# Patient Record
Sex: Female | Born: 1945 | Race: White | Hispanic: No | Marital: Married | Smoking: Current some day smoker
Health system: Southern US, Community
[De-identification: ages and names within clinical notes are randomized; demographics above are authoritative.]

## PROBLEM LIST (undated history)

## (undated) DIAGNOSIS — Z8701 Personal history of pneumonia (recurrent): Secondary | ICD-10-CM

## (undated) DIAGNOSIS — I509 Heart failure, unspecified: Secondary | ICD-10-CM

## (undated) DIAGNOSIS — I493 Ventricular premature depolarization: Secondary | ICD-10-CM

## (undated) DIAGNOSIS — G43909 Migraine, unspecified, not intractable, without status migrainosus: Secondary | ICD-10-CM

## (undated) DIAGNOSIS — R911 Solitary pulmonary nodule: Secondary | ICD-10-CM

## (undated) DIAGNOSIS — Z0389 Encounter for observation for other suspected diseases and conditions ruled out: Secondary | ICD-10-CM

## (undated) DIAGNOSIS — J449 Chronic obstructive pulmonary disease, unspecified: Secondary | ICD-10-CM

## (undated) DIAGNOSIS — I471 Supraventricular tachycardia, unspecified: Secondary | ICD-10-CM

## (undated) DIAGNOSIS — Z9889 Other specified postprocedural states: Secondary | ICD-10-CM

## (undated) DIAGNOSIS — Z720 Tobacco use: Secondary | ICD-10-CM

## (undated) DIAGNOSIS — I340 Nonrheumatic mitral (valve) insufficiency: Secondary | ICD-10-CM

## (undated) DIAGNOSIS — R42 Dizziness and giddiness: Secondary | ICD-10-CM

## (undated) DIAGNOSIS — IMO0001 Reserved for inherently not codable concepts without codable children: Secondary | ICD-10-CM

## (undated) DIAGNOSIS — Z8709 Personal history of other diseases of the respiratory system: Secondary | ICD-10-CM

## (undated) HISTORY — DX: Migraine, unspecified, not intractable, without status migrainosus: G43.909

## (undated) HISTORY — PX: CHOLECYSTECTOMY: SHX55

## (undated) HISTORY — PX: TONSILLECTOMY: SUR1361

## (undated) HISTORY — PX: EYE SURGERY: SHX253

---

## 2004-11-28 ENCOUNTER — Ambulatory Visit: Payer: Self-pay

## 2005-06-09 ENCOUNTER — Ambulatory Visit: Payer: Self-pay

## 2006-06-16 ENCOUNTER — Ambulatory Visit: Payer: Self-pay

## 2006-09-11 ENCOUNTER — Other Ambulatory Visit: Payer: Self-pay

## 2006-09-11 ENCOUNTER — Ambulatory Visit: Payer: Self-pay | Admitting: Family Medicine

## 2006-09-11 ENCOUNTER — Emergency Department: Payer: Self-pay | Admitting: Emergency Medicine

## 2006-09-12 ENCOUNTER — Emergency Department: Payer: Self-pay | Admitting: Emergency Medicine

## 2006-09-16 ENCOUNTER — Ambulatory Visit: Payer: Self-pay | Admitting: Specialist

## 2007-06-22 ENCOUNTER — Ambulatory Visit: Payer: Self-pay

## 2007-11-02 ENCOUNTER — Ambulatory Visit: Payer: Self-pay | Admitting: Endocrinology

## 2009-02-13 ENCOUNTER — Ambulatory Visit: Payer: Self-pay

## 2010-05-17 ENCOUNTER — Ambulatory Visit: Payer: Self-pay

## 2010-12-17 ENCOUNTER — Ambulatory Visit: Payer: Self-pay | Admitting: Psychiatry

## 2011-06-16 ENCOUNTER — Emergency Department: Payer: Self-pay | Admitting: Emergency Medicine

## 2011-09-17 ENCOUNTER — Ambulatory Visit: Payer: Self-pay

## 2011-09-18 ENCOUNTER — Ambulatory Visit: Payer: Self-pay

## 2013-07-15 ENCOUNTER — Emergency Department: Payer: Self-pay | Admitting: Emergency Medicine

## 2013-07-15 LAB — URINALYSIS, COMPLETE
Bacteria: NONE SEEN
Bilirubin,UR: NEGATIVE
Ketone: NEGATIVE
Leukocyte Esterase: NEGATIVE
Nitrite: NEGATIVE
Ph: 8 (ref 4.5–8.0)
RBC,UR: 1 /HPF (ref 0–5)
WBC UR: 1 /HPF (ref 0–5)

## 2013-07-15 LAB — TROPONIN I: Troponin-I: <0.02 ng/mL

## 2013-07-15 LAB — HEPATIC FUNCTION PANEL A (ARMC)
Albumin: 3.5 g/dL (ref 3.4–5.0)
Alkaline Phosphatase: 89 U/L
Bilirubin,Total: 0.3 mg/dL (ref 0.2–1.0)
SGPT (ALT): 14 U/L (ref 12–78)
Total Protein: 7.1 g/dL (ref 6.4–8.2)

## 2013-07-15 LAB — BASIC METABOLIC PANEL
Anion Gap: 6 — ABNORMAL LOW (ref 7–16)
Chloride: 105 mmol/L (ref 98–107)
EGFR (Non-African Amer.): >60
Glucose: 95 mg/dL (ref 65–99)
Osmolality: 270 (ref 275–301)
Potassium: 3.5 mmol/L (ref 3.5–5.1)
Sodium: 136 mmol/L (ref 136–145)

## 2013-07-15 LAB — CBC WITH DIFFERENTIAL/PLATELET
Basophil #: 0.2 10*3/uL — ABNORMAL HIGH (ref 0.0–0.1)
Basophil %: 1 %
Eosinophil #: 0.2 10*3/uL (ref 0.0–0.7)
Eosinophil %: 1.4 %
HCT: 41.9 % (ref 35.0–47.0)
HGB: 14.1 g/dL (ref 12.0–16.0)
Lymphocyte %: 23.6 %
MCH: 29.3 pg (ref 26.0–34.0)
MCHC: 33.6 g/dL (ref 32.0–36.0)
MCV: 87 fL (ref 80–100)
Neutrophil #: 10.4 10*3/uL — ABNORMAL HIGH (ref 1.4–6.5)
RBC: 4.8 10*6/uL (ref 3.80–5.20)
RDW: 13.2 % (ref 11.5–14.5)
WBC: 15.6 10*3/uL — ABNORMAL HIGH (ref 3.6–11.0)

## 2013-12-22 ENCOUNTER — Ambulatory Visit: Payer: Self-pay | Admitting: Internal Medicine

## 2014-01-12 ENCOUNTER — Ambulatory Visit: Payer: Self-pay | Admitting: Surgery

## 2014-01-12 LAB — HEPATIC FUNCTION PANEL A (ARMC)
ALBUMIN: 3.4 g/dL (ref 3.4–5.0)
AST: 15 U/L (ref 15–37)
Alkaline Phosphatase: 88 U/L
BILIRUBIN TOTAL: 0.2 mg/dL (ref 0.2–1.0)
Bilirubin, Direct: <0.1 mg/dL (ref 0.00–0.20)
SGPT (ALT): 12 U/L (ref 12–78)
Total Protein: 6.4 g/dL (ref 6.4–8.2)

## 2014-01-13 ENCOUNTER — Ambulatory Visit: Payer: Self-pay | Admitting: Surgery

## 2014-01-17 LAB — PATHOLOGY REPORT

## 2014-06-30 ENCOUNTER — Emergency Department: Payer: Self-pay | Admitting: Emergency Medicine

## 2014-06-30 DIAGNOSIS — I4891 Unspecified atrial fibrillation: Secondary | ICD-10-CM

## 2014-06-30 LAB — CBC
HCT: 42.8 % (ref 35.0–47.0)
HGB: 14.4 g/dL (ref 12.0–16.0)
MCH: 30.7 pg (ref 26.0–34.0)
MCHC: 33.6 g/dL (ref 32.0–36.0)
MCV: 91 fL (ref 80–100)
Platelet: 267 10*3/uL (ref 150–440)
RBC: 4.68 10*6/uL (ref 3.80–5.20)
RDW: 13 % (ref 11.5–14.5)
WBC: 14.5 10*3/uL — ABNORMAL HIGH (ref 3.6–11.0)

## 2014-06-30 LAB — BASIC METABOLIC PANEL
ANION GAP: 4 — AB (ref 7–16)
BUN: 8 mg/dL (ref 7–18)
CALCIUM: 8.8 mg/dL (ref 8.5–10.1)
CHLORIDE: 106 mmol/L (ref 98–107)
CO2: 29 mmol/L (ref 21–32)
CREATININE: 0.9 mg/dL (ref 0.60–1.30)
EGFR (African American): >60
GLUCOSE: 110 mg/dL — AB (ref 65–99)
Osmolality: 277 (ref 275–301)
Potassium: 3.8 mmol/L (ref 3.5–5.1)
Sodium: 139 mmol/L (ref 136–145)

## 2014-06-30 LAB — TROPONIN I

## 2014-06-30 LAB — T4, FREE: Free Thyroxine: 0.89 ng/dL (ref 0.76–1.46)

## 2014-06-30 LAB — CK: CK, TOTAL: 74 U/L (ref 26–192)

## 2014-07-13 ENCOUNTER — Encounter: Payer: Self-pay | Admitting: *Deleted

## 2014-07-13 ENCOUNTER — Encounter: Payer: Self-pay | Admitting: Cardiovascular Disease

## 2014-07-13 ENCOUNTER — Telehealth: Payer: Self-pay

## 2014-07-13 ENCOUNTER — Telehealth: Payer: Self-pay | Admitting: *Deleted

## 2014-07-13 MED ORDER — METOPROLOL TARTRATE 25 MG PO TABS: 25.0000 mg | ORAL_TABLET | Freq: Two times a day (BID) | ORAL | Status: DC

## 2014-07-13 NOTE — Telephone Encounter (Signed)
Informed patient per Dr. Fletcher Anon event monitor showed: 1- Normal sinus rhythm  2- No afib 3- very frequent pvc's and short runs of nsvt   Start metoprolol 25 mg bid   Patient verbalized understanding

## 2014-07-13 NOTE — Telephone Encounter (Signed)
Pt called back, states someone had called her regarding a medication Dr. Fletcher Anon wanted to put her on. Please call.

## 2014-07-13 NOTE — Telephone Encounter (Signed)
RX sent to [facility]

## 2014-07-13 NOTE — Telephone Encounter (Signed)
LVM 07/07/14

## 2014-07-14 ENCOUNTER — Encounter: Payer: Self-pay | Admitting: Cardiovascular Disease

## 2014-07-14 ENCOUNTER — Ambulatory Visit (INDEPENDENT_AMBULATORY_CARE_PROVIDER_SITE_OTHER): Payer: Medicare PPO | Admitting: Cardiovascular Disease

## 2014-07-14 VITALS — BP 86/60 | HR 70 | Ht 59.5 in | Wt 116.2 lb

## 2014-07-14 DIAGNOSIS — I493 Ventricular premature depolarization: Secondary | ICD-10-CM

## 2014-07-14 DIAGNOSIS — R000 Tachycardia, unspecified: Secondary | ICD-10-CM

## 2014-07-14 DIAGNOSIS — R0602 Shortness of breath: Secondary | ICD-10-CM

## 2014-07-14 DIAGNOSIS — R079 Chest pain, unspecified: Secondary | ICD-10-CM

## 2014-07-14 NOTE — Patient Instructions (Addendum)
[place]  Your caregiver has ordered a Stress Test with nuclear imaging. The purpose of this test is to evaluate the blood supply to your heart muscle. This procedure is referred to as a "Non-Invasive Stress Test." This is because other than having an IV started in your vein, nothing is inserted or "invades" your body. Cardiac stress tests are done to find areas of poor blood flow to the heart by determining the extent of coronary artery disease (CAD). Some patients exercise on a treadmill, which naturally increases the blood flow to your heart, while others who are  unable to walk on a treadmill due to physical limitations have a pharmacologic/chemical stress agent called Lexiscan . This medicine will mimic walking on a treadmill by temporarily increasing your coronary blood flow.   Please note: these test may take anywhere between 2-4 hours to complete  PLEASE REPORT TO [facility] AT THE FIRST DESK WILL DIRECT YOU WHERE TO GO  Date of Procedure:________12/15/15_____________________________  Arrival Time for Procedure:_______0745 am_______________________  Instructions regarding medication:    _x___:  Hold betablocker(s) night before procedure and morning of procedure: Metoprolol     PLEASE NOTIFY THE OFFICE AT LEAST 24 HOURS IN ADVANCE IF YOU ARE UNABLE TO KEEP YOUR APPOINTMENT.  [phone] AND  PLEASE NOTIFY NUCLEAR MEDICINE AT [facility] AT LEAST 24 HOURS IN ADVANCE IF YOU ARE UNABLE TO KEEP YOUR APPOINTMENT. [phone]  How to prepare for your Myoview test:  1. Do not eat or drink after midnight 2. No caffeine for 24 hours prior to test 3. No smoking 24 hours prior to test. 4. Your medication may be taken with water.  If your doctor stopped a medication because of this test, do not take that medication. 5. Ladies, please do not wear dresses.  Skirts or pants are appropriate. Please wear a short sleeve shirt. 6. No perfume, cologne or  lotion. 7. Wear comfortable walking shoes. No heels!  Your physician has requested that you have an echocardiogram. Echocardiography is a painless test that uses sound waves to create images of your heart. It provides your doctor with information about the size and shape of your heart and how well your heart's chambers and valves are working. This procedure takes approximately one hour. There are no restrictions for this procedure.  Your physician recommends that you schedule a follow-up appointment in:  With Dr. Fletcher Anon after your tests

## 2014-07-16 ENCOUNTER — Encounter: Payer: Self-pay | Admitting: Cardiovascular Disease

## 2014-07-16 DIAGNOSIS — R0602 Shortness of breath: Secondary | ICD-10-CM | POA: Insufficient documentation

## 2014-07-16 DIAGNOSIS — I493 Ventricular premature depolarization: Secondary | ICD-10-CM | POA: Insufficient documentation

## 2014-07-16 NOTE — Assessment & Plan Note (Signed)
The patient has been having significant exertional dyspnea with minimal activities which has been worsening. She has very frequent PVCs. The etiology of PVCs is still not established. Given her current symptoms, we definitely have to rule out an ischemic etiology or structural heart disease. Thus, I requested a treadmill nuclear stress test and an echocardiogram. Given frequent PVCs, a treadmill stress test alone is not sufficient.

## 2014-07-16 NOTE — Progress Notes (Signed)
Primary care physician: Dr. Ginette Pitman  HPI  This is a 68 year old self-referred patient for evaluation of palpitations. She reports previous history of palpitations which was evaluated by Dr. Ubaldo Glassing about 2 years ago. She was told about mild leaking in one of the heart valves but no other significant abnormalities. She has no history of diabetes, hypertension or hyperlipidemia. She does have prolonged history of tobacco use and continues to smoke almost one pack per day. She has family history of atrial fibrillation but no coronary artery disease. Over the few months, she has experienced significant symptoms of exertional dyspnea without chest discomfort. This has been associated with very frequent palpitations and irregular heartbeats. No orthopnea or PND. Thyroid function was normal. She went to the emergency room on November 27. EKG showed PVCs but no other significant abnormalities. Labs were unremarkable. A 48-hour Holter monitor was arranged which showed normal sinus rhythm with no evidence of atrial fibrillation. She was noted to have very frequent PVCs and short runs of nonsustained ventricular tachycardia. She had a total of 26,000 PVCs in 48 hours.  I started her on metoprolol.  No Known Allergies   Current Outpatient Prescriptions on File Prior to Visit  Medication Sig Dispense Refill  . metoprolol tartrate (LOPRESSOR) 25 MG tablet Take 1 tablet (25 mg total) by mouth 2 (two) times daily. 180 tablet 3   No current facility-administered medications on file prior to visit.     Past Medical History  Diagnosis Date  . Migraine      Past Surgical History  Procedure Laterality Date  . Cesarean section    . Cholecystectomy    . Tonsillectomy       Family History  Problem Relation Age of Onset  . Stroke Father      History   Social History  . Marital Status: Married    Spouse Name: N/A    Number of Children: N/A  . Years of Education: N/A   Occupational History  . Not on  file.   Social History Main Topics  . Smoking status: Current Every Day Smoker -- 45 years    Types: Cigarettes  . Smokeless tobacco: Not on file  . Alcohol Use: Yes     Comment: occasional  . Drug Use: No  . Sexual Activity: Not on file   Other Topics Concern  . Not on file   Social History Narrative     ROS A 10 point review of system was performed. It is negative other than that mentioned in the history of present illness.   PHYSICAL EXAM   BP 86/60 mmHg  Pulse 70  Ht 4' 11.5" (1.511 m)  Wt 116 lb 4 oz (52.731 kg)  BMI 23.10 kg/m2 Constitutional: She is oriented to person, place, and time. She appears well-developed and well-nourished. No distress.  HENT: No nasal discharge.  Head: Normocephalic and atraumatic.  Eyes: Pupils are equal and round. No discharge.  Neck: Normal range of motion. Neck supple. No JVD present. No thyromegaly present.  Cardiovascular: Normal rate, regular rhythm, normal heart sounds. Exam reveals no gallop and no friction rub. No murmur heard.  Pulmonary/Chest: Effort normal and breath sounds normal. No stridor. No respiratory distress. She has no wheezes. She has no rales. She exhibits no tenderness.  Abdominal: Soft. Bowel sounds are normal. She exhibits no distension. There is no tenderness. There is no rebound and no guarding.  Musculoskeletal: Normal range of motion. She exhibits no edema and no tenderness.  Neurological: She  is alert and oriented to person, place, and time. Coordination normal.  Skin: Skin is warm and dry. No rash noted. She is not diaphoretic. No erythema. No pallor.  Psychiatric: She has a normal mood and affect. Her behavior is normal. Judgment and thought content normal.     NHA:FBXUX  Rhythm  - occasional ectopic ventricular beat    WITHIN NORMAL LIMITS   ASSESSMENT AND PLAN

## 2014-07-16 NOTE — Assessment & Plan Note (Signed)
She has excessive amount of PVCs and was started on small dose metoprolol. Blood pressure is low and thus the dose cannot be increased. Further management will be decided based on the results of cardiac testing.

## 2014-07-18 ENCOUNTER — Encounter: Payer: Self-pay | Admitting: Cardiovascular Disease

## 2014-07-18 ENCOUNTER — Ambulatory Visit: Payer: Self-pay | Admitting: Cardiovascular Disease

## 2014-07-18 DIAGNOSIS — R0602 Shortness of breath: Secondary | ICD-10-CM

## 2014-07-20 ENCOUNTER — Other Ambulatory Visit (INDEPENDENT_AMBULATORY_CARE_PROVIDER_SITE_OTHER): Payer: Commercial Managed Care - HMO

## 2014-07-20 ENCOUNTER — Other Ambulatory Visit: Payer: Self-pay

## 2014-07-20 DIAGNOSIS — R079 Chest pain, unspecified: Secondary | ICD-10-CM

## 2014-07-20 DIAGNOSIS — R000 Tachycardia, unspecified: Secondary | ICD-10-CM

## 2014-07-20 DIAGNOSIS — R0602 Shortness of breath: Secondary | ICD-10-CM

## 2014-07-21 ENCOUNTER — Encounter (INDEPENDENT_AMBULATORY_CARE_PROVIDER_SITE_OTHER): Payer: Commercial Managed Care - HMO

## 2014-08-02 ENCOUNTER — Encounter: Payer: Self-pay | Admitting: Cardiovascular Disease

## 2014-08-02 ENCOUNTER — Encounter: Payer: Self-pay | Admitting: *Deleted

## 2014-08-02 ENCOUNTER — Telehealth: Payer: Self-pay | Admitting: *Deleted

## 2014-08-02 ENCOUNTER — Ambulatory Visit (INDEPENDENT_AMBULATORY_CARE_PROVIDER_SITE_OTHER): Payer: Commercial Managed Care - HMO | Admitting: Cardiovascular Disease

## 2014-08-02 VITALS — BP 94/60 | HR 67 | Ht 59.5 in | Wt 116.2 lb

## 2014-08-02 DIAGNOSIS — Z01812 Encounter for preprocedural laboratory examination: Secondary | ICD-10-CM

## 2014-08-02 DIAGNOSIS — Z720 Tobacco use: Secondary | ICD-10-CM

## 2014-08-02 DIAGNOSIS — I340 Nonrheumatic mitral (valve) insufficiency: Secondary | ICD-10-CM

## 2014-08-02 DIAGNOSIS — I493 Ventricular premature depolarization: Secondary | ICD-10-CM

## 2014-08-02 NOTE — Assessment & Plan Note (Signed)
Symptoms improved with small dose metoprolol. Nuclear stress test showed no evidence of ischemia and ejection fraction is normal. I might consider a follow-up with her monitor in the near future to ensure appropriate control of the burden of PVCs.

## 2014-08-02 NOTE — Assessment & Plan Note (Signed)
I discussed with her the importance of smoking cessation. 

## 2014-08-02 NOTE — Patient Instructions (Signed)
Your physician has requested that you have a TEE. During a TEE, sound waves are used to create images of your heart. It provides your doctor with information about the size and shape of your heart and how well your heart's chambers and valves are working. In this test, a transducer is attached to the end of a flexible tube that's guided down your throat and into your esophagus (the tube leading from you mouth to your stomach) to get a more detailed image of your heart. You are not awake for the procedure. Please see the instruction sheet given to you today. For further information please visit [email or web address].   Your physician recommends that you schedule a follow-up appointment in:  1 month

## 2014-08-02 NOTE — Telephone Encounter (Signed)
Informed patient her TEE is scheduled for 08/07/14 at 8 am  She is to arrive at 7 am  Patient verbalized understanding

## 2014-08-02 NOTE — Assessment & Plan Note (Addendum)
The patient was found to have prolapse of the posterior mitral leaflet with moderate to severe mitral regurgitation. I recommend a transesophageal echocardiogram for further evaluation as she continues to complain of significant dyspnea.Marland Kitchen

## 2014-08-02 NOTE — Progress Notes (Signed)
Primary care physician: Dr. Ginette Pitman  HPI  This is a 68 year old female who is here today for a follow-up visit regarding PVCs. She reports previous history of palpitations which was evaluated by Dr. Ubaldo Glassing about 2 years ago. She was told about mild leaking in one of the heart valves but no other significant abnormalities. She has no history of diabetes, hypertension or hyperlipidemia. She does have prolonged history of tobacco use and continues to smoke almost one pack per day. She has family history of atrial fibrillation but no coronary artery disease. She was seen recently for significant symptoms of exertional dyspnea without chest discomfort. This was associated with very frequent palpitations and irregular heartbeats. No orthopnea or PND. Thyroid function was normal. A 48-hour Holter monitor was arranged which showed normal sinus rhythm with no evidence of atrial fibrillation. She was noted to have very frequent PVCs and short runs of nonsustained ventricular tachycardia. She had a total of 26,000 PVCs in 48 hours.  I started her on metoprolol with subsequent improvement in symptoms. She underwent a pharmacologic nuclear stress test which showed no evidence of ischemia with normal ejection fraction.  Echocardiogram showed an ejection fraction of 60-65%, moderate posterior mitral valve prolapse with moderate to severe mitral regurgitation.  No Known Allergies   Current Outpatient Prescriptions on File Prior to Visit  Medication Sig Dispense Refill  . aspirin 81 MG tablet Take 81 mg by mouth daily.    . Calcium Carbonate-Vitamin D (CALCIUM-VITAMIN D) 500-200 MG-UNIT per tablet Take 1 tablet by mouth daily.    . cholecalciferol (VITAMIN D) 400 UNITS TABS tablet Take 400 Units by mouth daily.    . Inulin (FIBER CHOICE PO) Take by mouth daily.    . metoprolol tartrate (LOPRESSOR) 25 MG tablet Take 1 tablet (25 mg total) by mouth 2 (two) times daily. 180 tablet 3   No current facility-administered  medications on file prior to visit.     Past Medical History  Diagnosis Date  . Migraine      Past Surgical History  Procedure Laterality Date  . Cesarean section    . Cholecystectomy    . Tonsillectomy       Family History  Problem Relation Age of Onset  . Stroke Father      History   Social History  . Marital Status: Married    Spouse Name: N/A    Number of Children: N/A  . Years of Education: N/A   Occupational History  . Not on file.   Social History Main Topics  . Smoking status: Current Every Day Smoker -- 45 years    Types: Cigarettes  . Smokeless tobacco: Not on file  . Alcohol Use: Yes     Comment: occasional  . Drug Use: No  . Sexual Activity: Not on file   Other Topics Concern  . Not on file   Social History Narrative     ROS A 10 point review of system was performed. It is negative other than that mentioned in the history of present illness.   PHYSICAL EXAM   BP 94/60 mmHg  Pulse 67  Ht 4' 11.5" (1.511 m)  Wt 116 lb 4 oz (52.731 kg)  BMI 23.10 kg/m2 Constitutional: She is oriented to person, place, and time. She appears well-developed and well-nourished. No distress.  HENT: No nasal discharge.  Head: Normocephalic and atraumatic.  Eyes: Pupils are equal and round. No discharge.  Neck: Normal range of motion. Neck supple. No JVD present. No  thyromegaly present.  Cardiovascular: Normal rate, regular rhythm, normal heart sounds. Exam reveals no gallop and no friction rub. No murmur heard.  Pulmonary/Chest: Effort normal and breath sounds normal. No stridor. No respiratory distress. She has no wheezes. She has no rales. She exhibits no tenderness.  Abdominal: Soft. Bowel sounds are normal. She exhibits no distension. There is no tenderness. There is no rebound and no guarding.  Musculoskeletal: Normal range of motion. She exhibits no edema and no tenderness.  Neurological: She is alert and oriented to person, place, and time.  Coordination normal.  Skin: Skin is warm and dry. No rash noted. She is not diaphoretic. No erythema. No pallor.  Psychiatric: She has a normal mood and affect. Her behavior is normal. Judgment and thought content normal.     EKG: Sinus  Rhythm  - occasional ectopic ventricular beat    WITHIN NORMAL LIMITS   ASSESSMENT AND PLAN

## 2014-08-03 LAB — CBC WITH DIFFERENTIAL
Basophils Absolute: 0.1 10*3/uL (ref 0.0–0.2)
Basos: 1 %
EOS ABS: 0.2 10*3/uL (ref 0.0–0.4)
Eos: 2 %
HCT: 40.3 % (ref 34.0–46.6)
Hemoglobin: 13.9 g/dL (ref 11.1–15.9)
Immature Grans (Abs): 0 10*3/uL (ref 0.0–0.1)
Immature Granulocytes: 0 %
LYMPHS ABS: 3.4 10*3/uL — AB (ref 0.7–3.1)
Lymphs: 30 %
MCH: 30.6 pg (ref 26.6–33.0)
MCHC: 34.5 g/dL (ref 31.5–35.7)
MCV: 89 fL (ref 79–97)
MONOS ABS: 0.9 10*3/uL (ref 0.1–0.9)
Monocytes: 9 %
Neutrophils Absolute: 6.6 10*3/uL (ref 1.4–7.0)
Neutrophils Relative %: 58 %
PLATELETS: 264 10*3/uL (ref 150–379)
RBC: 4.54 x10E6/uL (ref 3.77–5.28)
RDW: 13.4 % (ref 12.3–15.4)
WBC: 11.1 10*3/uL — ABNORMAL HIGH (ref 3.4–10.8)

## 2014-08-03 LAB — PROTIME-INR
INR: 1 (ref 0.8–1.2)
PROTHROMBIN TIME: 10.3 s (ref 9.1–12.0)

## 2014-08-03 LAB — BASIC METABOLIC PANEL
BUN / CREAT RATIO: 13 (ref 11–26)
BUN: 11 mg/dL (ref 8–27)
CHLORIDE: 107 mmol/L (ref 97–108)
CO2: 18 mmol/L (ref 18–29)
Calcium: 9.4 mg/dL (ref 8.7–10.3)
Creatinine, Ser: 0.85 mg/dL (ref 0.57–1.00)
GFR calc non Af Amer: 72 mL/min/{1.73_m2} (ref >59)
GFR, EST AFRICAN AMERICAN: 83 mL/min/{1.73_m2} (ref >59)
Glucose: 121 mg/dL — ABNORMAL HIGH (ref 65–99)
Potassium: 4.7 mmol/L (ref 3.5–5.2)
Sodium: 144 mmol/L (ref 134–144)

## 2014-08-07 ENCOUNTER — Ambulatory Visit: Payer: Self-pay | Admitting: Cardiovascular Disease

## 2014-08-07 ENCOUNTER — Encounter: Payer: Self-pay | Admitting: Cardiovascular Disease

## 2014-08-07 DIAGNOSIS — I340 Nonrheumatic mitral (valve) insufficiency: Secondary | ICD-10-CM

## 2014-08-21 ENCOUNTER — Encounter: Payer: Self-pay | Admitting: *Deleted

## 2014-08-21 ENCOUNTER — Encounter: Payer: Self-pay | Admitting: Cardiovascular Disease

## 2014-08-21 ENCOUNTER — Ambulatory Visit (INDEPENDENT_AMBULATORY_CARE_PROVIDER_SITE_OTHER): Payer: PPO | Admitting: Cardiovascular Disease

## 2014-08-21 VITALS — BP 114/70 | HR 60 | Ht 59.5 in | Wt 120.0 lb

## 2014-08-21 DIAGNOSIS — R0789 Other chest pain: Secondary | ICD-10-CM

## 2014-08-21 DIAGNOSIS — I493 Ventricular premature depolarization: Secondary | ICD-10-CM

## 2014-08-21 DIAGNOSIS — R0602 Shortness of breath: Secondary | ICD-10-CM

## 2014-08-21 DIAGNOSIS — Z720 Tobacco use: Secondary | ICD-10-CM

## 2014-08-21 DIAGNOSIS — I340 Nonrheumatic mitral (valve) insufficiency: Secondary | ICD-10-CM

## 2014-08-21 MED ORDER — DILTIAZEM HCL ER COATED BEADS 120 MG PO CP24: 120.0000 mg | ORAL_CAPSULE | Freq: Every day | ORAL | Status: DC

## 2014-08-21 NOTE — Patient Instructions (Signed)
Stop taking Metoprolol.   Start Diltiazem ER 120 mg once daily.   Follow up in 2 months.

## 2014-08-21 NOTE — Progress Notes (Signed)
Primary care physician: Dr. Ginette Pitman  HPI  This is a 69 year old female who is here today for a follow-up visit regarding PVCs and mitral regurgitation.She has no history of diabetes, hypertension or hyperlipidemia. She does have prolonged history of tobacco use and continues to smoke almost one pack per day. She has family history of atrial fibrillation but no coronary artery disease. She was seen recently for significant symptoms of exertional dyspnea without chest discomfort. This was associated with very frequent palpitations and irregular heartbeats. No orthopnea or PND. Thyroid function was normal. A 48-hour Holter monitor was arranged which showed normal sinus rhythm with no evidence of atrial fibrillation. She was noted to have very frequent PVCs and short runs of nonsustained ventricular tachycardia. She had a total of 26,000 PVCs in 48 hours.  I started her on metoprolol with subsequent improvement in symptoms. She underwent a pharmacologic nuclear stress test which showed no evidence of ischemia with normal ejection fraction.  Echocardiogram showed an ejection fraction of 60-65%, moderate posterior mitral valve prolapse with moderate to severe mitral regurgitation. I performed TEE which showed moderate mitral regurgitation. She reports overall improvement in symptoms but palpitations have not resolved completely.  No Known Allergies   Current Outpatient Prescriptions on File Prior to Visit  Medication Sig Dispense Refill  . aspirin 81 MG tablet Take 81 mg by mouth daily.    . Calcium Carbonate-Vitamin D (CALCIUM-VITAMIN D) 500-200 MG-UNIT per tablet Take 1 tablet by mouth daily.    . cholecalciferol (VITAMIN D) 400 UNITS TABS tablet Take 400 Units by mouth daily.    . Inulin (FIBER CHOICE PO) Take by mouth daily.    . metoprolol tartrate (LOPRESSOR) 25 MG tablet Take 1 tablet (25 mg total) by mouth 2 (two) times daily. 180 tablet 3   No current facility-administered medications on file  prior to visit.     Past Medical History  Diagnosis Date  . Migraine      Past Surgical History  Procedure Laterality Date  . Cesarean section    . Cholecystectomy    . Tonsillectomy       Family History  Problem Relation Age of Onset  . Stroke Father      History   Social History  . Marital Status: Married    Spouse Name: N/A    Number of Children: N/A  . Years of Education: N/A   Occupational History  . Not on file.   Social History Main Topics  . Smoking status: Current Every Day Smoker -- 45 years    Types: Cigarettes  . Smokeless tobacco: Not on file  . Alcohol Use: Yes     Comment: occasional  . Drug Use: No  . Sexual Activity: Not on file   Other Topics Concern  . Not on file   Social History Narrative     ROS A 10 point review of system was performed. It is negative other than that mentioned in the history of present illness.   PHYSICAL EXAM   BP 114/70 mmHg  Pulse 60  Ht 4' 11.5" (1.511 m)  Wt 120 lb (54.432 kg)  BMI 23.84 kg/m2 Constitutional: She is oriented to person, place, and time. She appears well-developed and well-nourished. No distress.  HENT: No nasal discharge.  Head: Normocephalic and atraumatic.  Eyes: Pupils are equal and round. No discharge.  Neck: Normal range of motion. Neck supple. No JVD present. No thyromegaly present.  Cardiovascular: Normal rate, regular rhythm, normal heart sounds. Exam reveals  no gallop and no friction rub. No murmur heard.  Pulmonary/Chest: Effort normal and breath sounds normal. No stridor. No respiratory distress. She has no wheezes. She has no rales. She exhibits no tenderness.  Abdominal: Soft. Bowel sounds are normal. She exhibits no distension. There is no tenderness. There is no rebound and no guarding.  Musculoskeletal: Normal range of motion. She exhibits no edema and no tenderness.  Neurological: She is alert and oriented to person, place, and time. Coordination normal.  Skin: Skin  is warm and dry. No rash noted. She is not diaphoretic. No erythema. No pallor.  Psychiatric: She has a normal mood and affect. Her behavior is normal. Judgment and thought content normal.       ASSESSMENT AND PLAN

## 2014-08-24 ENCOUNTER — Telehealth: Payer: Self-pay

## 2014-08-24 MED ORDER — METOPROLOL TARTRATE 25 MG PO TABS: 25.0000 mg | ORAL_TABLET | Freq: Two times a day (BID) | ORAL | Status: DC

## 2014-08-24 NOTE — Telephone Encounter (Signed)
Pt states that her medication, Dialtiazem is not working. Please call.

## 2014-08-24 NOTE — Telephone Encounter (Signed)
Patient called and stated she stopped her metoprolol Monday night and started her Diltiazem  She originally thought it was helping but today she feels dizzy and has a weird pressure in her head  She stated that her heart is racing and she is short of breath with exertion  She stated she did not know how to check her heart rate and does not have a blood pressure cuff  She is worried the medication is not working

## 2014-08-24 NOTE — Telephone Encounter (Signed)
Switch back to Metoprolol.

## 2014-08-24 NOTE — Telephone Encounter (Signed)
Instructed patient per Dr. Fletcher Anon to stop Diltiazem  Restart Metoprolol 25 mg twice daily  Patient verbalized understanding

## 2014-08-25 NOTE — Assessment & Plan Note (Signed)
Shortness of breath is likely due to underlying lung disease from prolonged tobacco use.

## 2014-08-25 NOTE — Assessment & Plan Note (Signed)
This was moderate on TEE and due to mitral valve prolapse. Continue to monitor every 2-3 years with echo.

## 2014-08-25 NOTE — Assessment & Plan Note (Signed)
Symptoms improved significantly but has not resolved. I'm going to attempt treatment with diltiazem instead of metoprolol and evaluate response. Given the very high frequency of PVCs, I would consider repeating Holter monitor. Ejection fraction was normal.

## 2014-08-25 NOTE — Assessment & Plan Note (Signed)
I again discussed with her the importance of smoking cessation.

## 2014-09-04 ENCOUNTER — Ambulatory Visit: Payer: Commercial Managed Care - HMO | Admitting: Cardiovascular Disease

## 2014-10-05 ENCOUNTER — Telehealth: Payer: Self-pay | Admitting: *Deleted

## 2014-10-05 MED ORDER — METOPROLOL TARTRATE 25 MG PO TABS: 25.0000 mg | ORAL_TABLET | Freq: Two times a day (BID) | ORAL | Status: DC

## 2014-10-05 NOTE — Telephone Encounter (Signed)
Pt calling needing a refill   1. Which medications need to be refilled? Metoprolol  2. Which pharmacy is medication to be sent to? [facility] [address]   3. Do they need a 30 day or 90 day supply? 90 day  4. Would they like a call back once the medication has been sent to the pharmacy? Yes please

## 2014-10-05 NOTE — Telephone Encounter (Signed)
Refill sent for metoprolol.  

## 2014-10-23 ENCOUNTER — Ambulatory Visit: Payer: PPO | Admitting: Cardiovascular Disease

## 2014-11-03 ENCOUNTER — Encounter: Payer: Self-pay | Admitting: *Deleted

## 2014-11-09 ENCOUNTER — Encounter: Payer: Self-pay | Admitting: Cardiovascular Disease

## 2014-11-09 ENCOUNTER — Ambulatory Visit (INDEPENDENT_AMBULATORY_CARE_PROVIDER_SITE_OTHER): Payer: PPO | Admitting: Cardiovascular Disease

## 2014-11-09 VITALS — BP 108/70 | HR 67 | Ht 59.5 in | Wt 119.0 lb

## 2014-11-09 DIAGNOSIS — K5900 Constipation, unspecified: Secondary | ICD-10-CM | POA: Insufficient documentation

## 2014-11-09 DIAGNOSIS — I493 Ventricular premature depolarization: Secondary | ICD-10-CM

## 2014-11-09 DIAGNOSIS — Z9889 Other specified postprocedural states: Secondary | ICD-10-CM

## 2014-11-09 DIAGNOSIS — I340 Nonrheumatic mitral (valve) insufficiency: Secondary | ICD-10-CM

## 2014-11-09 DIAGNOSIS — K5909 Other constipation: Secondary | ICD-10-CM

## 2014-11-09 DIAGNOSIS — R0602 Shortness of breath: Secondary | ICD-10-CM | POA: Diagnosis not present

## 2014-11-09 NOTE — Assessment & Plan Note (Signed)
This cannot be fully explain by her cardiac findings. She might have underlying COPD. I'm referring her for pulmonary evaluation.

## 2014-11-09 NOTE — Assessment & Plan Note (Signed)
This seems to be reasonably controlled on current dose of metoprolol. She did worse on diltiazem and I am hesitant to switch her to verapamil due to chronic constipation. I recommend continuing current treatment.

## 2014-11-09 NOTE — Progress Notes (Signed)
Primary care physician: Dr. Ginette Pitman  HPI  This is a 69 year old female who is here today for a follow-up visit regarding PVCs and mitral regurgitation.She has no history of diabetes, hypertension or hyperlipidemia. She does have prolonged history of tobacco use and continues to smoke almost one pack per day. She has family history of atrial fibrillation but no coronary artery disease. She was seen in 07/2014 for significant symptoms of exertional dyspnea without chest discomfort. This was associated with very frequent palpitations and irregular heartbeats. No orthopnea or PND. Thyroid function was normal. A 48-hour Holter monitor was arranged which showed normal sinus rhythm with no evidence of atrial fibrillation. She was noted to have very frequent PVCs and short runs of nonsustained ventricular tachycardia. She had a total of 26,000 PVCs in 48 hours.  I started her on metoprolol with subsequent improvement in symptoms. She underwent a pharmacologic nuclear stress test which showed no evidence of ischemia with normal ejection fraction.  Echocardiogram showed an ejection fraction of 60-65%, moderate posterior mitral valve prolapse with moderate to severe mitral regurgitation. I performed TEE which showed moderate mitral regurgitation. I switched her from metoprolol to diltiazem during last visit but she actually was doing better on metoprolol and thus was switched back. She continues to have complain of significant dyspnea. She is not aware of history of COPD but has prolonged history of tobacco use. She also complain of constipation and frequent heartburn. She is due for colonoscopy.  No Known Allergies   Current Outpatient Prescriptions on File Prior to Visit  Medication Sig Dispense Refill  . aspirin 81 MG tablet Take 81 mg by mouth daily.    . Calcium Carbonate-Vitamin D (CALCIUM-VITAMIN D) 500-200 MG-UNIT per tablet Take 1 tablet by mouth daily.    . cholecalciferol (VITAMIN D) 400 UNITS TABS  tablet Take 400 Units by mouth daily.    . Inulin (FIBER CHOICE PO) Take by mouth daily.    . metoprolol tartrate (LOPRESSOR) 25 MG tablet Take 1 tablet (25 mg total) by mouth 2 (two) times daily. 180 tablet 3   No current facility-administered medications on file prior to visit.     Past Medical History  Diagnosis Date  . Migraine      Past Surgical History  Procedure Laterality Date  . Cesarean section    . Cholecystectomy    . Tonsillectomy       Family History  Problem Relation Age of Onset  . Stroke Father      History   Social History  . Marital Status: Married    Spouse Name: N/A  . Number of Children: N/A  . Years of Education: N/A   Occupational History  . Not on file.   Social History Main Topics  . Smoking status: Current Every Day Smoker -- 45 years    Types: Cigarettes  . Smokeless tobacco: Not on file  . Alcohol Use: Yes     Comment: occasional  . Drug Use: No  . Sexual Activity: Not on file   Other Topics Concern  . Not on file   Social History Narrative     ROS A 10 point review of system was performed. It is negative other than that mentioned in the history of present illness.   PHYSICAL EXAM   BP 108/70 mmHg  Pulse 67  Ht 4' 11.5" (1.511 m)  Wt 119 lb (53.978 kg)  BMI 23.64 kg/m2 Constitutional: She is oriented to person, place, and time. She appears well-developed and  well-nourished. No distress.  HENT: No nasal discharge.  Head: Normocephalic and atraumatic.  Eyes: Pupils are equal and round. No discharge.  Neck: Normal range of motion. Neck supple. No JVD present. No thyromegaly present.  Cardiovascular: Normal rate, regular rhythm, normal heart sounds. Exam reveals no gallop and no friction rub. No murmur heard.  Pulmonary/Chest: Effort normal and breath sounds normal. No stridor. No respiratory distress. She has no wheezes. She has no rales. She exhibits no tenderness.  Abdominal: Soft. Bowel sounds are normal. She  exhibits no distension. There is no tenderness. There is no rebound and no guarding.  Musculoskeletal: Normal range of motion. She exhibits no edema and no tenderness.  Neurological: She is alert and oriented to person, place, and time. Coordination normal.  Skin: Skin is warm and dry. No rash noted. She is not diaphoretic. No erythema. No pallor.  Psychiatric: She has a normal mood and affect. Her behavior is normal. Judgment and thought content normal.       ASSESSMENT AND PLAN

## 2014-11-09 NOTE — Assessment & Plan Note (Signed)
According to the patient, she is due for colonoscopy. I referred her to GI for evaluation of this and also to address increased symptoms of GERD.

## 2014-11-09 NOTE — Patient Instructions (Addendum)
Refer to Dr. Stevenson Clinch for evaluation of dyspnea and possible COPD. Please arrive on November 28, 2014 at 2:45 for a 3:00 appointment.   Refer to Dr. Allen Norris (GI) for colonoscopy, someone will call you with an appointment.    Continue same medications.   Your physician wants you to follow-up in: 6 months.  You will receive a reminder letter in the mail two months in advance. If you don't receive a letter, please call our office to schedule the follow-up appointment.

## 2014-11-09 NOTE — Assessment & Plan Note (Signed)
This was moderate on TEE and due to mitral valve prolapse. Continue to monitor every 2-3 years with echo.

## 2014-11-25 NOTE — Op Note (Signed)
PATIENT NAME:  Nicole Norman, Nicole Norman MR#:  [id number] DATE OF BIRTH:  03/21/1946  DATE OF PROCEDURE:  01/13/2014  PREOPERATIVE DIAGNOSIS:  Chronic cholecystitis and cholelithiasis.   POSTOPERATIVE DIAGNOSIS:  Chronic cholecystitis and cholelithiasis.   PROCEDURE PERFORMED:  Laparoscopic cholecystectomy.   SURGEON: Rochel Brome, M.D.   ANESTHESIA:  General.   INDICATIONS:  This 69 year old female has a history of epigastric pains, ultrasound findings of gallstones. Surgery was recommended for definitive treatment.   The patient was placed on the operating table in the supine position under general anesthesia. The abdomen was prepared with ChloraPrep and draped in a sterile manner.   A short incision was made in the inferior aspect of the umbilicus and carried down to the deep fascia, which was grasped with laryngeal hook and elevated. A Veress needle was inserted, aspirated and irrigated with a saline solution. Next, the peritoneal cavity was inflated with carbon dioxide. The Veress needle was removed. The 10 mm cannula was inserted. The 10 mm, 0 degree laparoscope was inserted to view the peritoneal cavity. Another incision was made in the epigastrium, slightly to the right of the midline to introduce an 11 mm cannula. Two incisions were made in the lateral aspect of the right upper quadrant to introduce two 5 mm cannulas.   Initial inspection revealed no abnormalities of the liver; no mass or cyst was identified. The surface was smooth.   The gallbladder was fairly large. It was retracted towards the right shoulder. The infundibulum was mobilized with incision of the visceral peritoneum. The cystic duct was dissected free from surrounding structures. The cystic artery was dissected free from surrounding structures. A critical view of safety was demonstrated. An endoclip was placed across the cystic duct adjacent to the neck of the gallbladder. An incision was made in the cystic duct to insert a  Reddick catheter; however, the Reddick catheter would not thread, therefore cholangiogram was not done. The cystic duct was doubly ligated with endoclips and divided. The cystic artery was controlled with endoclips and divided. The gallbladder was dissected free from the liver with hook and cautery. Bleeding was very minimal. Hemostasis was subsequently intact. The gallbladder was delivered up through the infraumbilical incision, opened and suctioned; noted cholesterolosis, and also 2 stones were removed with the gallbladder and submitted in formalin for routine pathology. The right upper quadrant was further inspected. Hemostasis was intact. The cannulas were removed. Carbon dioxide was allowed to escape from the peritoneal cavity. Skin incisions were closed with interrupted 5-0 chromic subcuticular sutures, benzoin and Steri-Strips. Dressings were applied with paper tape. The patient tolerated surgery satisfactorily and was moved to the recovery room for postoperative care.      ____________________________ J. Rochel Brome, MD jws:dmm D: 01/13/2014 16:32:41 ET T: 01/13/2014 22:04:06 ET JOB#: [id number]  cc: Loreli Dollar, MD, <Dictator> Loreli Dollar MD ELECTRONICALLY SIGNED 01/16/2014 8:26

## 2014-11-28 ENCOUNTER — Encounter: Payer: Self-pay | Admitting: Internal Medicine

## 2014-11-28 ENCOUNTER — Ambulatory Visit (INDEPENDENT_AMBULATORY_CARE_PROVIDER_SITE_OTHER): Payer: PPO | Admitting: Internal Medicine

## 2014-11-28 VITALS — BP 104/60 | HR 74 | Temp 97.9°F | Ht 59.5 in | Wt 120.0 lb

## 2014-11-28 DIAGNOSIS — Z720 Tobacco use: Secondary | ICD-10-CM | POA: Diagnosis not present

## 2014-11-28 DIAGNOSIS — R0602 Shortness of breath: Secondary | ICD-10-CM | POA: Diagnosis not present

## 2014-11-28 DIAGNOSIS — R0609 Other forms of dyspnea: Secondary | ICD-10-CM | POA: Diagnosis not present

## 2014-11-28 NOTE — Patient Instructions (Addendum)
Follow up with Dr. Stevenson Clinch in 2 months - pulmonary function testing and 6 minute walk test prior to follow up - healthy and exercise - CT Chest without contrast for shortness of breath prior to follow up visit - avoid tobacco

## 2014-11-28 NOTE — Assessment & Plan Note (Addendum)
Multifactorial - possible obstructive vs restrictive disease, deconditioning, PVCs, tobacco abuse  Plan: - pulmonary function testing and 6 minute walk test prior to follow up - healthy diet and exercise - CT Chest without contrast for shortness of breath prior to follow up visit - avoid tobacco

## 2014-11-28 NOTE — Progress Notes (Signed)
Date: 11/28/2014  MRN# [id number] Nicole Norman 1946/06/18  Referring Physician: Dr. Fletcher Anon PMD -  Dr. Clenton Pare is a 69 y.o. old female seen in consultation for dyspnea  CC:  Chief Complaint  Patient presents with  . Advice Only    Referred by Dr. Fletcher Anon for sob. Pt has experience sob 6-8 months.    HPI:  Patient is a pleasant 69 year old female is seen in consultation today for further evaluation of dyspnea. Patient states she's been having dyspnea for the past 6-8 months, there is no inciting event that she can recall during that time. Patient has a history of PVCs is currently being treated with beta blocker. She also has a diagnosis of new mitral valve prolapse that is managed with close observation. She describes her dyspnea on exertion is intermittent, can't climb up a flight of stairs before getting short of breath and can walk maybe about 50 yards. She has a past medical history of irritable bowel syndrome, pulmonary with pulmonary premature ventricular contractions, hypertension, bilateral pneumonia and hemoptysis in 2008. Patient endorses a tobacco history. Stated that she smoked 1.5 packs per day for about 35 years, currently down to 8 cigarettes per day since Thanksgiving of 2015. Previously worked as a Network engineer and in a [employer], but not exposed to significant amounts of nylon fibers or other clothing fibers. Patient states she has pets at home: 2 dogs, 1 cat. No history of yearly ER/urgent care visits for bronchitis, wheezing, cough, pneumonia.  (Chart reviewed by Dr. Stevenson Clinch) Review of Dr. Fletcher Anon Noted HPI 11/09/14 This is a 69 year old female who is here today for a follow-up visit regarding PVCs and mitral regurgitation.She has no history of diabetes, hypertension or hyperlipidemia. She does have prolonged history of tobacco use and continues to smoke almost one pack per day. She has family history of atrial fibrillation but no  coronary artery disease. She was seen in 07/2014 for significant symptoms of exertional dyspnea without chest discomfort. This was associated with very frequent palpitations and irregular heartbeats. No orthopnea or PND. Thyroid function was normal. A 48-hour Holter monitor was arranged which showed normal sinus rhythm with no evidence of atrial fibrillation. She was noted to have very frequent PVCs and short runs of nonsustained ventricular tachycardia. She had a total of 26,000 PVCs in 48 hours. I started her on metoprolol with subsequent improvement in symptoms. She underwent a pharmacologic nuclear stress test which showed no evidence of ischemia with normal ejection fraction.  Echocardiogram showed an ejection fraction of 60-65%, moderate posterior mitral valve prolapse with moderate to severe mitral regurgitation. I performed TEE which showed moderate mitral regurgitation. I switched her from metoprolol to diltiazem during last visit but she actually was doing better on metoprolol and thus was switched back. She continues to have complain of significant dyspnea. She is not aware of history of COPD but has prolonged history of tobacco use. She also complain of constipation and frequent heartburn. She is due for colonoscopy. Plan - PVC, cont with beta blocker, referral to pulmonary for possible COPD evaluation.   PMHX:   Past Medical History  Diagnosis Date  . Migraine    Surgical Hx:  Past Surgical History  Procedure Laterality Date  . Cesarean section    . Cholecystectomy    . Tonsillectomy     Family Hx:  Family History  Problem Relation Age of Onset  . Stroke Father   . Alzheimer's disease Mother   .  Cancer Brother   . Cancer Brother   . Cancer Sister     breast  . Cancer Sister     breast   Social Hx:   History  Substance Use Topics  . Smoking status: Current Every Day Smoker -- 0.50 packs/day for 45 years    Types: Cigarettes  . Smokeless tobacco: Never Used  .  Alcohol Use: 0.0 oz/week    0 Standard drinks or equivalent per week     Comment: occasional   Medication:   Current Outpatient Rx  Name  Route  Sig  Dispense  Refill  . aspirin 81 MG tablet   Oral   Take 81 mg by mouth daily.         . Calcium Carbonate-Vitamin D (CALCIUM-VITAMIN D) 500-200 MG-UNIT per tablet   Oral   Take 1 tablet by mouth daily.         . Cholecalciferol (VITAMIN D3) 2000 UNITS TABS   Oral   Take 1 tablet by mouth daily.         Marland Kitchen esomeprazole (NEXIUM) 20 MG capsule   Oral   Take 20 mg by mouth daily at 12 noon.         . Inulin (FIBER CHOICE PO)   Oral   Take by mouth daily.         . metoprolol tartrate (LOPRESSOR) 25 MG tablet   Oral   Take 1 tablet (25 mg total) by mouth 2 (two) times daily.   180 tablet   3   . polyethylene glycol (MIRALAX / GLYCOLAX) packet   Oral   Take 17 g by mouth daily.             Allergies:  Review of patient's allergies indicates no known allergies.  Review of Systems: Gen:  Denies  fever, sweats, chills HEENT: Denies blurred vision, double vision, ear pain, eye pain, hearing loss, nose bleeds, sore throat Cvc:  No dizziness, chest pain or heaviness Resp:   Denies cough or sputum porduction, shortness of breath Gi: Denies swallowing difficulty, stomach pain, nausea or vomiting, diarrhea, constipation, bowel incontinence Gu:  Denies bladder incontinence, burning urine Ext:   No Joint pain, stiffness or swelling Skin: No skin rash, easy bruising or bleeding or hives Endoc:  No polyuria, polydipsia , polyphagia or weight change Psych: No depression, insomnia or hallucinations  Other:  All other systems negative  Physical Examination:   VS: BP 104/60 mmHg  Pulse 74  Temp(Src) 97.9 F (36.6 C) (Oral)  Ht 4' 11.5" (1.511 m)  Wt 120 lb (54.432 kg)  BMI 23.84 kg/m2  SpO2 93%  General Appearance: No distress  Neuro:without focal findings, mental status, speech normal, alert and oriented, cranial  nerves 2-12 intact, reflexes normal and symmetric, sensation grossly normal  HEENT: PERRLA, EOM intact, no ptosis, no other lesions noticed; Mallampati 1 Pulmonary: normal breath sounds., diaphragmatic excursion normal.No wheezing, No rales;   Sputum Production:  none CardiovascularNormal S1,S2.  No m/r/g.  Abdominal aorta pulsation normal.    Abdomen: Benign, Soft, non-tender, No masses, hepatosplenomegaly, No lymphadenopathy Renal:  No costovertebral tenderness  GU:  No performed at this time. Endoc: No evident thyromegaly, no signs of acromegaly or Cushing features Skin:   warm, no rashes, no ecchymosis  Extremities: normal, no cyanosis, clubbing, no edema, warm with normal capillary refill. Other findings:none    Assessment and Plan:69 year old female past medical history of PVCs, hypertension, irritable bowel syndrome, bilateral pneumonia with hemoptysis seen in  consultation for dyspnea on exertion. DOE (dyspnea on exertion) Multifactorial - possible obstructive vs restrictive disease, deconditioning, PVCs, tobacco abuse  Plan: - pulmonary function testing and 6 minute walk test prior to follow up - healthy diet and exercise - CT Chest without contrast for shortness of breath prior to follow up visit - avoid tobacco   SOB (shortness of breath) See plan for dyspnea on exertion   Tobacco use Tobacco Cessation - Counseling regarding benefits of smoking cessation strategies was provided for more than 12 min. - Educated that at this time smoking- cessation represents the single most important step that patient can take to enhance the length and quality of live. - Educated patient regarding alternatives of behavior interventions, pharmacotherapy including NRT and non-nicotine therapy such, and combinations of both. - Patient at this time: we'll try to quit on her own, currently down to 8 cigarettes per day from 1.5 packs per day.      Updated Medication List Outpatient Encounter  Prescriptions as of 11/28/2014  Medication Sig  . aspirin 81 MG tablet Take 81 mg by mouth daily.  . Calcium Carbonate-Vitamin D (CALCIUM-VITAMIN D) 500-200 MG-UNIT per tablet Take 1 tablet by mouth daily.  . Cholecalciferol (VITAMIN D3) 2000 UNITS TABS Take 1 tablet by mouth daily.  Marland Kitchen esomeprazole (NEXIUM) 20 MG capsule Take 20 mg by mouth daily at 12 noon.  . Inulin (FIBER CHOICE PO) Take by mouth daily.  . metoprolol tartrate (LOPRESSOR) 25 MG tablet Take 1 tablet (25 mg total) by mouth 2 (two) times daily.  . polyethylene glycol (MIRALAX / GLYCOLAX) packet Take 17 g by mouth daily.  . [DISCONTINUED] cholecalciferol (VITAMIN D) 400 UNITS TABS tablet Take 400 Units by mouth daily.    Orders for this visit: Orders Placed This Encounter  Procedures  . CT Chest Wo Contrast    Standing Status: Future     Number of Occurrences: 1     Standing Expiration Date: 01/28/2016    Scheduling Instructions:     Schedule in mid-June    Order Specific Question:  Reason for Exam (SYMPTOM  OR DIAGNOSIS REQUIRED)    Answer:  dyspnea    Order Specific Question:  Preferred imaging location?    Answer:  [facility]  . Pulmonary function test    Standing Status: Future     Number of Occurrences: 1     Standing Expiration Date: 11/28/2015    Scheduling Instructions:     Schedule in [place]    Order Specific Question:  Where should this test be performed?    Answer:  [facility]    Order Specific Question:  Full PFT: includes the following: basic spirometry, spirometry pre & post bronchodilator, diffusion capacity (DLCO), lung volumes    Answer:  Full PFT    Order Specific Question:  MIP/MEP    Answer:  No    Order Specific Question:  6 minute walk    Answer:  Yes    Order Specific Question:  ABG    Answer:  No    Order Specific Question:  Diffusion capacity (DLCO)    Answer:  No    Order Specific Question:  Lung volumes    Answer:  No    Order Specific Question:  Methacholine  challenge    Answer:  No     Thank  you for the consultation and for allowing [facility] to assist in the care of your patient. Our recommendations are noted above.  Please  contact us if we can be of further service.   Vilinda Boehringer, MD [facility] Office Number: [phone]

## 2014-11-29 NOTE — Assessment & Plan Note (Signed)
See plan for dyspnea on exertion 

## 2014-11-29 NOTE — Assessment & Plan Note (Signed)
Tobacco Cessation - Counseling regarding benefits of smoking cessation strategies was provided for more than 12 min. - Educated that at this time smoking- cessation represents the single most important step that patient can take to enhance the length and quality of live. - Educated patient regarding alternatives of behavior interventions, pharmacotherapy including NRT and non-nicotine therapy such, and combinations of both. - Patient at this time: we'll try to quit on her own, currently down to 8 cigarettes per day from 1.5 packs per day.

## 2014-12-04 ENCOUNTER — Encounter: Payer: Self-pay | Admitting: *Deleted

## 2014-12-05 NOTE — Progress Notes (Signed)
Recent Stress test and echo negative for ischemia.

## 2014-12-07 ENCOUNTER — Encounter
Admission: RE | Disposition: A | Discharge status: Home / Self Care | Payer: Self-pay | Source: Ambulatory Visit | Attending: Gastroenterology

## 2014-12-07 ENCOUNTER — Ambulatory Visit: Payer: PPO | Admitting: Anesthesiology

## 2014-12-07 ENCOUNTER — Encounter: Payer: Self-pay | Admitting: Anesthesiology

## 2014-12-07 ENCOUNTER — Ambulatory Visit
Admission: RE | Admit: 2014-12-07 | Discharge: 2014-12-07 | Disposition: A | Discharge status: Home / Self Care | Payer: PPO | Source: Ambulatory Visit | Attending: Gastroenterology | Admitting: Gastroenterology

## 2014-12-07 DIAGNOSIS — K219 Gastro-esophageal reflux disease without esophagitis: Secondary | ICD-10-CM | POA: Insufficient documentation

## 2014-12-07 DIAGNOSIS — F17210 Nicotine dependence, cigarettes, uncomplicated: Secondary | ICD-10-CM | POA: Diagnosis not present

## 2014-12-07 DIAGNOSIS — Z7982 Long term (current) use of aspirin: Secondary | ICD-10-CM | POA: Insufficient documentation

## 2014-12-07 DIAGNOSIS — Z86010 Personal history of colonic polyps: Secondary | ICD-10-CM | POA: Diagnosis not present

## 2014-12-07 DIAGNOSIS — R1310 Dysphagia, unspecified: Secondary | ICD-10-CM | POA: Insufficient documentation

## 2014-12-07 DIAGNOSIS — Z79899 Other long term (current) drug therapy: Secondary | ICD-10-CM | POA: Insufficient documentation

## 2014-12-07 DIAGNOSIS — K640 First degree hemorrhoids: Secondary | ICD-10-CM | POA: Diagnosis not present

## 2014-12-07 DIAGNOSIS — K5730 Diverticulosis of large intestine without perforation or abscess without bleeding: Secondary | ICD-10-CM | POA: Insufficient documentation

## 2014-12-07 DIAGNOSIS — D125 Benign neoplasm of sigmoid colon: Secondary | ICD-10-CM | POA: Diagnosis not present

## 2014-12-07 HISTORY — PX: COLONOSCOPY: SHX5424

## 2014-12-07 HISTORY — PX: ESOPHAGOGASTRODUODENOSCOPY: SHX5428

## 2014-12-07 HISTORY — DX: Dizziness and giddiness: R42

## 2014-12-07 SURGERY — COLONOSCOPY
Anesthesia: Monitor Anesthesia Care | Wound class: Contaminated

## 2014-12-07 MED ORDER — STERILE WATER FOR IRRIGATION IR SOLN
Status: DC | PRN
  Administered 2014-12-07: 10:00:00

## 2014-12-07 MED ORDER — SODIUM CHLORIDE 0.9 % IV SOLN: INTRAVENOUS | Status: DC

## 2014-12-07 MED ORDER — LACTATED RINGERS IV SOLN
INTRAVENOUS | Status: DC
  Administered 2014-12-07: 09:00:00 via INTRAVENOUS
  Administered 2014-12-07: 10 mL/h via INTRAVENOUS

## 2014-12-07 MED ORDER — LIDOCAINE HCL (CARDIAC) 20 MG/ML IV SOLN
INTRAVENOUS | Status: DC | PRN
  Administered 2014-12-07: 30 mg via INTRAVENOUS

## 2014-12-07 MED ORDER — PROPOFOL 10 MG/ML IV BOLUS
INTRAVENOUS | Status: DC | PRN
  Administered 2014-12-07: 30 mg via INTRAVENOUS
  Administered 2014-12-07: 40 mg via INTRAVENOUS
  Administered 2014-12-07: 50 mg via INTRAVENOUS
  Administered 2014-12-07: 20 mg via INTRAVENOUS
  Administered 2014-12-07: 80 mg via INTRAVENOUS
  Administered 2014-12-07: 20 mg via INTRAVENOUS
  Administered 2014-12-07: 40 mg via INTRAVENOUS
  Administered 2014-12-07 (×2): 30 mg via INTRAVENOUS
  Administered 2014-12-07: 40 mg via INTRAVENOUS
  Administered 2014-12-07: 20 mg via INTRAVENOUS

## 2014-12-07 MED ORDER — GLYCOPYRROLATE 0.2 MG/ML IJ SOLN
INTRAMUSCULAR | Status: DC | PRN
  Administered 2014-12-07: 0.2 mg via INTRAVENOUS

## 2014-12-07 SURGICAL SUPPLY — 38 items
BALLN DILATOR 10-12 8 (BALLOONS)
BALLN DILATOR 12-15 8 (BALLOONS)
BALLN DILATOR 15-18 8 (BALLOONS)
BALLN DILATOR CRE 0-12 8 (BALLOONS)
BALLN DILATOR ESOPH 8 10 CRE (MISCELLANEOUS) IMPLANT
BALLOON DILATOR 12-15 8 (BALLOONS) IMPLANT
BALLOON DILATOR 15-18 8 (BALLOONS) IMPLANT
BALLOON DILATOR CRE 0-12 8 (BALLOONS) IMPLANT
BLOCK BITE 60FR ADLT L/F GRN (MISCELLANEOUS) ×3 IMPLANT
CANISTER SUCT 1200ML W/VALVE (MISCELLANEOUS) ×3 IMPLANT
FCP ESCP3.2XJMB 240X2.8X (MISCELLANEOUS) ×1
FORCEPS BIOP RAD 4 LRG CAP 4 (CUTTING FORCEPS) IMPLANT
FORCEPS BIOP RJ4 240 W/NDL (MISCELLANEOUS) ×2
FORCEPS ESCP3.2XJMB 240X2.8X (MISCELLANEOUS) ×1 IMPLANT
GOWN CVR UNV OPN BCK APRN NK (MISCELLANEOUS) ×2 IMPLANT
GOWN ISOL THUMB LOOP REG UNIV (MISCELLANEOUS) ×4
HEMOCLIP INSTINCT (CLIP) IMPLANT
INJECTOR VARIJECT VIN23 (MISCELLANEOUS) IMPLANT
KIT CO2 TUBING (TUBING) ×3 IMPLANT
KIT DEFENDO VALVE AND CONN (KITS) IMPLANT
KIT ENDO PROCEDURE OLY (KITS) ×3 IMPLANT
LIGATOR MULTIBAND 6SHOOTER MBL (MISCELLANEOUS) IMPLANT
MARKER SPOT ENDO TATTOO 5ML (MISCELLANEOUS) IMPLANT
PAD GROUND ADULT SPLIT (MISCELLANEOUS) IMPLANT
SNARE SHORT THROW 13M SML OVAL (MISCELLANEOUS) IMPLANT
SNARE SHORT THROW 30M LRG OVAL (MISCELLANEOUS) IMPLANT
SPOT EX ENDOSCOPIC TATTOO (MISCELLANEOUS)
SUCTION POLY TRAP 4CHAMBER (MISCELLANEOUS) IMPLANT
SYR INFLATION 60ML (SYRINGE) IMPLANT
TRAP SUCTION POLY (MISCELLANEOUS) IMPLANT
TUBING CONN 6MMX3.1M (TUBING)
TUBING SUCTION CONN 0.25 STRL (TUBING) IMPLANT
UNDERPAD 30X60 958B10 (PK) (MISCELLANEOUS) IMPLANT
VALVE BIOPSY ENDO (VALVE) IMPLANT
VARIJECT INJECTOR VIN23 (MISCELLANEOUS)
WATER AUXILLARY (MISCELLANEOUS) IMPLANT
WATER STERILE IRR 500ML POUR (IV SOLUTION) ×3 IMPLANT
WIRE CRE 18-20MM 8CM F G (MISCELLANEOUS) IMPLANT

## 2014-12-07 NOTE — Anesthesia Procedure Notes (Signed)
Procedure Name: MAC Performed by: Ladena Jacquez Pre-anesthesia Checklist: Patient identified, Emergency Drugs available, Suction available, Patient being monitored and Timeout performed Patient Re-evaluated:Patient Re-evaluated prior to inductionOxygen Delivery Method: Nasal cannula Preoxygenation: Pre-oxygenation with 100% oxygen       

## 2014-12-07 NOTE — Anesthesia Postprocedure Evaluation (Signed)
  Anesthesia Post-op Note  Patient: Nicole Norman  Procedure(s) Performed: Procedure(s): COLONOSCOPY (N/A) ESOPHAGOGASTRODUODENOSCOPY (EGD) (N/A)  Anesthesia type:MAC  Patient location: PACU  Post pain: Pain level controlled  Post assessment: Post-op Vital signs reviewed, Patient's Cardiovascular Status Stable, Respiratory Function Stable, Patent Airway and No signs of Nausea or vomiting  Post vital signs: Reviewed and stable  Last Vitals:  Filed Vitals:   12/07/14 0945  BP:   Pulse:   Temp: 36.3 C  Resp:     Level of consciousness: awake, alert  and patient cooperative  Complications: No apparent anesthesia complications

## 2014-12-07 NOTE — Discharge Instructions (Signed)

## 2014-12-07 NOTE — Anesthesia Preprocedure Evaluation (Signed)
Anesthesia Evaluation  Patient identified by MRN, date of birth, ID band  Reviewed: Allergy & Precautions, H&P , NPO status , Patient's Chart, lab work & pertinent test results  Airway Mallampati: II  TM Distance: >3 FB Neck ROM: full    Dental no notable dental hx.    Pulmonary Current Smoker,    Pulmonary exam normal       Cardiovascular Exercise Tolerance: Good Normal cardiovascular exam- dysrhythmias (palpitations; pvc)     Neuro/Psych  Headaches,    GI/Hepatic GERD-  Controlled,  Endo/Other    Renal/GU      Musculoskeletal   Abdominal   Peds  Hematology   Anesthesia Other Findings Ekg: Sinus  Rhythm  - occasional ectopic ventricular beat    WITHIN NORMAL LIMITS;    Reproductive/Obstetrics                             Anesthesia Physical Anesthesia Plan  ASA: II  Anesthesia Plan: MAC   Post-op Pain Management:    Induction:   Airway Management Planned:   Additional Equipment:   Intra-op Plan:   Post-operative Plan:   Informed Consent: I have reviewed the patients History and Physical, chart, labs and discussed the procedure including the risks, benefits and alternatives for the proposed anesthesia with the patient or authorized representative who has indicated his/her understanding and acceptance.     Plan Discussed with: CRNA  Anesthesia Plan Comments:         Anesthesia Quick Evaluation

## 2014-12-07 NOTE — Transfer of Care (Signed)
Immediate Anesthesia Transfer of Care Note  Patient: Nicole Norman  Procedure(s) Performed: Procedure(s): COLONOSCOPY (N/A) ESOPHAGOGASTRODUODENOSCOPY (EGD) (N/A)  Patient Location: PACU  Anesthesia Type: MAC  Level of Consciousness: awake, alert  and patient cooperative  Airway and Oxygen Therapy: Patient Spontanous Breathing and Patient connected to supplemental oxygen  Post-op Assessment: Post-op Vital signs reviewed, Patient's Cardiovascular Status Stable, Respiratory Function Stable, Patent Airway and No signs of Nausea or vomiting  Post-op Vital Signs: Reviewed and stable  Complications: No apparent anesthesia complications

## 2014-12-07 NOTE — H&P (Addendum)
@LOGO @  Primary Care Physician:  Tracie Harrier, MD Primary Gastroenterologist:  Dr. Allen Norris  Pre-Procedure History & Physical: HPI:  Nicole Norman is a 69 y.o. female is here for an endoscopy and colonoscopy.   Past Medical History  Diagnosis Date  . Dysrhythmia     "Fast and skips beats"  Reports recent echo and stress with Dr Fletcher Anon - [facility]  . Migraine     1x/month - uses Aleve  . Vertigo     no meds  . Shortness of breath dyspnea     1 flight stairs - winded    Past Surgical History  Procedure Laterality Date  . Cesarean section    . Cholecystectomy    . Tonsillectomy      Prior to Admission medications   Medication Sig Start Date End Date Taking? Authorizing Provider  aspirin 81 MG tablet Take 81 mg by mouth daily. AM   Yes Historical Provider, MD  Calcium Carbonate-Vitamin D (CALCIUM-VITAMIN D) 500-200 MG-UNIT per tablet Take 1 tablet by mouth daily. 10 AM   Yes Historical Provider, MD  Cholecalciferol (VITAMIN D3) 2000 UNITS TABS Take 1 tablet by mouth daily. 10 AM   Yes Historical Provider, MD  esomeprazole (NEXIUM) 20 MG capsule Take 20 mg by mouth daily at 12 noon. 07:30 AM   Yes Historical Provider, MD  FIBER SELECT GUMMIES PO Take by mouth. 07:30 AM   Yes Historical Provider, MD  Inulin (FIBER CHOICE PO) Take by mouth daily. 07:30 AM   Yes Historical Provider, MD  metoprolol tartrate (LOPRESSOR) 25 MG tablet Take 1 tablet (25 mg total) by mouth 2 (two) times daily. 10/05/14  Yes Wellington Hampshire, MD  polyethylene glycol (MIRALAX / GLYCOLAX) packet Take 17 g by mouth daily.   Yes Historical Provider, MD    Allergies as of 12/04/2014  . (No Known Allergies)    Family History  Problem Relation Age of Onset  . Stroke Father   . Alzheimer's disease Mother   . Cancer Brother   . Cancer Brother   . Cancer Sister     breast  . Cancer Sister     breast    History   Social History  . Marital Status: Married    Spouse Name: N/A  . Number of Children: N/A   . Years of Education: N/A   Occupational History  . Not on file.   Social History Main Topics  . Smoking status: Current Every Day Smoker -- 0.50 packs/day for 45 years    Types: Cigarettes  . Smokeless tobacco: Never Used  . Alcohol Use: No     Comment: occasional  . Drug Use: No  . Sexual Activity: Not on file   Other Topics Concern  . Not on file   Social History Narrative    Review of Systems: See HPI, otherwise negative ROS  Physical Exam: BP 103/67 mmHg  Pulse 57  Temp(Src) 97.7 F (36.5 C) (Temporal)  Resp 16  Ht 4' 11.5" (1.511 m)  Wt 116 lb (52.617 kg)  BMI 23.05 kg/m2  SpO2 97% General:   Alert,  pleasant and cooperative in NAD Head:  Normocephalic and atraumatic. Neck:  Supple; no masses or thyromegaly. Lungs:  Clear throughout to auscultation.    Heart:  Regular rate and rhythm. Abdomen:  Soft, nontender and nondistended. Normal bowel sounds, without guarding, and without rebound.   Neurologic:  Alert and  oriented x4;  grossly normal neurologically.  Impression/Plan: Nicole Norman is here  for an endoscopy and colonoscopy to be performed for GERD and constipatio  Risks, benefits, limitations, and alternatives regarding  endoscopy and colonoscopy have been reviewed with the patient.  Questions have been answered.  All parties agreeable.   [facility], MD  12/07/2014, 8:19 AM

## 2014-12-07 NOTE — Op Note (Signed)
[facility] Gastroenterology Patient Name: Nicole Norman Procedure Date: 12/07/2014 7:17 AM MRN: [id number] Account #: [id number] Date of Birth: [birth year] Admit Type: Outpatient Age: 69 Room: [facility] OR ROOM 01 Gender: Female Note Status: Finalized Procedure:         Colonoscopy Indications:       High risk colon cancer surveillance: Personal history of                     colonic polyps Providers:         Lucilla Lame, MD Referring MD:      Mertie Clause. Fletcher Anon, MD (Referring MD) Medicines:         Propofol per Anesthesia Complications:     No immediate complications. Procedure:         Pre-Anesthesia Assessment:                    - Prior to the procedure, a History and Physical was                     performed, and patient medications and allergies were                     reviewed. The patient's tolerance of previous anesthesia                     was also reviewed. The risks and benefits of the procedure                     and the sedation options and risks were discussed with the                     patient. All questions were answered, and informed consent                     was obtained. Prior Anticoagulants: The patient has taken                     no previous anticoagulant or antiplatelet agents. ASA                     Grade Assessment: II - A patient with mild systemic                     disease. After reviewing the risks and benefits, the                     patient was deemed in satisfactory condition to undergo                     the procedure.                    After obtaining informed consent, the colonoscope was                     passed under direct vision. Throughout the procedure, the                     patient's blood pressure, pulse, and oxygen saturations                     were monitored continuously. The Olympus CF-HQ190L  Colonoscope (S#. [id number]) was introduced through the anus                     and  advanced to the the cecum, identified by appendiceal                     orifice and ileocecal valve. The colonoscopy was performed                     without difficulty. The patient tolerated the procedure                     well. The quality of the bowel preparation was excellent. Findings:      The perianal and digital rectal examinations were normal.      A 8 mm polyp was found in the recto-sigmoid colon. The polyp was       pedunculated. The polyp was removed with a cold snare. Resection and       retrieval were complete.      Multiple small-mouthed diverticula were found in the sigmoid colon.      Non-bleeding internal hemorrhoids were found during retroflexion. The       hemorrhoids were Grade I (internal hemorrhoids that do not prolapse). Impression:        - One 8 mm polyp at the recto-sigmoid colon. Resected and                     retrieved.                    - Diverticulosis in the sigmoid colon.                    - Non-bleeding internal hemorrhoids. Recommendation:    - Await pathology results.                    - Repeat colonoscopy in 5 years for surveillance. Procedure Code(s): --- Professional ---                    [phone], Colonoscopy, flexible; with removal of tumor(s),                     polyp(s), or other lesion(s) by snare technique Diagnosis Code(s): --- Professional ---                    Z86.010, Personal history of colonic polyps                    D12.7, Benign neoplasm of rectosigmoid junction CPT copyright 2014 American Medical Association. All rights reserved. The codes documented in this report are preliminary and upon coder review may  be revised to meet current compliance requirements. Lucilla Lame, MD 12/07/2014 9:40:11 AM This report has been signed electronically. Number of Addenda: 0 Note Initiated On: 12/07/2014 7:17 AM Scope Withdrawal Time: 0 hours 6 minutes 59 seconds  Total Procedure Duration: 0 hours 11 minutes 21 seconds       [facility]

## 2014-12-07 NOTE — Op Note (Signed)
[facility] Gastroenterology Patient Name: Nicole Norman Procedure Date: 12/07/2014 7:14 AM MRN: [id number] Account #: [id number] Date of Birth: 09/07/1945 Admit Type: Outpatient Age: 69 Room: [facility] OR ROOM 01 Gender: Female Note Status: Finalized Procedure:         Upper GI endoscopy Indications:       Dysphagia, Heartburn Providers:         Lucilla Lame, MD Referring MD:      Mertie Clause. Fletcher Anon, MD (Referring MD) Medicines:         Propofol per Anesthesia Complications:     No immediate complications. Procedure:         Pre-Anesthesia Assessment:                    - Prior to the procedure, a History and Physical was                     performed, and patient medications and allergies were                     reviewed. The patient's tolerance of previous anesthesia                     was also reviewed. The risks and benefits of the procedure                     and the sedation options and risks were discussed with the                     patient. All questions were answered, and informed consent                     was obtained. Prior Anticoagulants: The patient has taken                     no previous anticoagulant or antiplatelet agents. ASA                     Grade Assessment: II - A patient with mild systemic                     disease. After reviewing the risks and benefits, the                     patient was deemed in satisfactory condition to undergo                     the procedure.                    After obtaining informed consent, the endoscope was passed                     under direct vision. Throughout the procedure, the                     patient's blood pressure, pulse, and oxygen saturations                     were monitored continuously. The Olympus GIF H180J                     colonscope (H#:[id number]) was introduced through the mouth,  and advanced to the second part of duodenum. The upper GI   endoscopy was accomplished without difficulty. The patient                     tolerated the procedure well. Findings:      The examined esophagus was normal. The scope was withdrawn. Dilation was       performed with a Maloney dilator with mild resistance at 77 Fr and 54       Fr. Biopsies were taken with a cold forceps for histology. Two biopsies       were obtained in the middle third of the esophagus with cold forceps for       histology.      The stomach was normal.      The examined duodenum was normal. Impression:        - Normal esophagus. Dilated. Biopsied.                    - Normal stomach.                    - Normal examined duodenum.                    - Two biopsies were obtained in the middle third of the                     esophagus. Recommendation:    - Await pathology results.                    - Perform a colonoscopy today. Procedure Code(s): --- Professional ---                    [phone], Esophagogastroduodenoscopy, flexible, transoral;                     with biopsy, single or multiple                    43450, Dilation of esophagus, by unguided sound or bougie,                     single or multiple passes Diagnosis Code(s): --- Professional ---                    R12, Heartburn                    R13.10, Dysphagia, unspecified CPT copyright 2014 American Medical Association. All rights reserved. The codes documented in this report are preliminary and upon coder review may  be revised to meet current compliance requirements. Lucilla Lame, MD 12/07/2014 9:25:08 AM This report has been signed electronically. Number of Addenda: 0 Note Initiated On: 12/07/2014 7:14 AM Total Procedure Duration: 0 hours 4 minutes 54 seconds       [facility]

## 2014-12-12 ENCOUNTER — Encounter: Payer: Self-pay | Admitting: Gastroenterology

## 2015-01-05 ENCOUNTER — Encounter: Payer: Self-pay | Admitting: Gastroenterology

## 2015-01-17 ENCOUNTER — Ambulatory Visit: Payer: PPO

## 2015-01-31 ENCOUNTER — Ambulatory Visit: Payer: PPO | Admitting: Internal Medicine

## 2015-01-31 ENCOUNTER — Ambulatory Visit: Payer: PPO

## 2015-05-07 ENCOUNTER — Ambulatory Visit (INDEPENDENT_AMBULATORY_CARE_PROVIDER_SITE_OTHER): Payer: PPO | Admitting: Cardiovascular Disease

## 2015-05-07 ENCOUNTER — Encounter: Payer: Self-pay | Admitting: Cardiovascular Disease

## 2015-05-07 VITALS — BP 100/60 | HR 67 | Ht 59.5 in | Wt 124.0 lb

## 2015-05-07 DIAGNOSIS — Z720 Tobacco use: Secondary | ICD-10-CM | POA: Diagnosis not present

## 2015-05-07 DIAGNOSIS — I493 Ventricular premature depolarization: Secondary | ICD-10-CM | POA: Diagnosis not present

## 2015-05-07 DIAGNOSIS — I340 Nonrheumatic mitral (valve) insufficiency: Secondary | ICD-10-CM | POA: Diagnosis not present

## 2015-05-07 NOTE — Assessment & Plan Note (Signed)
I again discussed with her the importance of smoking cessation. She thinks she can quit on her own.

## 2015-05-07 NOTE — Progress Notes (Signed)
Primary care physician: Dr. Ginette Pitman  HPI  This is a 69 year old female who is here today for a follow-up visit regarding PVCs and mitral regurgitation.She has no history of diabetes, hypertension or hyperlipidemia. She does have prolonged history of tobacco use and continues to smoke almost one pack per day. She has family history of atrial fibrillation but no coronary artery disease. She was seen in 07/2014 for significant symptoms of exertional dyspnea without chest discomfort. This was associated with very frequent palpitations and irregular heartbeats. No orthopnea or PND. Thyroid function was normal. A 48-hour Holter monitor was arranged which showed normal sinus rhythm with no evidence of atrial fibrillation. She was noted to have very frequent PVCs and short runs of nonsustained ventricular tachycardia. She had a total of 26,000 PVCs in 48 hours.  I started her on metoprolol with subsequent improvement in symptoms. She underwent a pharmacologic nuclear stress test which showed no evidence of ischemia with normal ejection fraction.  Echocardiogram showed an ejection fraction of 60-65%, moderate posterior mitral valve prolapse with moderate to severe mitral regurgitation. I performed TEE which showed moderate mitral regurgitation. For PVCs, she was tried on both metoprolol and diltiazem and felt better with metoprolol. Due to continued shortness of breath, I referred her to pulmonary for evaluation. She was seen by Dr. Stevenson Clinch. However, she reports that her dyspnea improved without intervention and she did not go through the pulmonary testing. She is feeling very well right now with no chest pain, shortness of breath or orthopnea. She reports mild palpitations only at night. She continues to smoke 6 cigarettes a day.  No Known Allergies   Current Outpatient Prescriptions on File Prior to Visit  Medication Sig Dispense Refill  . aspirin 81 MG tablet Take 81 mg by mouth daily. AM    . Calcium  Carbonate-Vitamin D (CALCIUM-VITAMIN D) 500-200 MG-UNIT per tablet Take 1 tablet by mouth daily. 10 AM    . Cholecalciferol (VITAMIN D3) 2000 UNITS TABS Take 1 tablet by mouth daily. 10 AM    . esomeprazole (NEXIUM) 20 MG capsule Take 20 mg by mouth daily at 12 noon. 07:30 AM    . FIBER SELECT GUMMIES PO Take by mouth. 07:30 AM    . Inulin (FIBER CHOICE PO) Take by mouth daily. 07:30 AM    . metoprolol tartrate (LOPRESSOR) 25 MG tablet Take 1 tablet (25 mg total) by mouth 2 (two) times daily. 180 tablet 3  . polyethylene glycol (MIRALAX / GLYCOLAX) packet Take 17 g by mouth daily.     No current facility-administered medications on file prior to visit.     Past Medical History  Diagnosis Date  . Dysrhythmia     "Fast and skips beats"  Reports recent echo and stress with Dr Fletcher Anon - [facility]  . Migraine     1x/month - uses Aleve  . Vertigo     no meds  . Shortness of breath dyspnea     1 flight stairs - winded     Past Surgical History  Procedure Laterality Date  . Cesarean section    . Cholecystectomy    . Tonsillectomy    . Colonoscopy N/A 12/07/2014    Procedure: COLONOSCOPY;  Surgeon: Lucilla Lame, MD;  Location: [place];  Service: Gastroenterology;  Laterality: N/A;  . Esophagogastroduodenoscopy N/A 12/07/2014    Procedure: ESOPHAGOGASTRODUODENOSCOPY (EGD);  Surgeon: Lucilla Lame, MD;  Location: [place];  Service: Gastroenterology;  Laterality: N/A;     Family History  Problem Relation Age of Onset  . Stroke Father   . Alzheimer's disease Mother   . Cancer Brother   . Cancer Brother   . Cancer Sister     breast  . Cancer Sister     breast     Social History   Social History  . Marital Status: Married    Spouse Name: N/A  . Number of Children: N/A  . Years of Education: N/A   Occupational History  . Not on file.   Social History Main Topics  . Smoking status: Current Every Day Smoker -- 0.50 packs/day for 45 years    Types: Cigarettes  .  Smokeless tobacco: Never Used  . Alcohol Use: No     Comment: occasional  . Drug Use: No  . Sexual Activity: Not on file   Other Topics Concern  . Not on file   Social History Narrative     ROS A 10 point review of system was performed. It is negative other than that mentioned in the history of present illness.   PHYSICAL EXAM   BP 100/60 mmHg  Pulse 67  Ht 4' 11.5" (1.511 m)  Wt 124 lb (56.246 kg)  BMI 24.64 kg/m2 Constitutional: She is oriented to person, place, and time. She appears well-developed and well-nourished. No distress.  HENT: No nasal discharge.  Head: Normocephalic and atraumatic.  Eyes: Pupils are equal and round. No discharge.  Neck: Normal range of motion. Neck supple. No JVD present. No thyromegaly present.  Cardiovascular: Normal rate, regular rhythm, normal heart sounds. Exam reveals no gallop and no friction rub. No murmur heard.  Pulmonary/Chest: Effort normal and breath sounds normal. No stridor. No respiratory distress. She has no wheezes. She has no rales. She exhibits no tenderness.  Abdominal: Soft. Bowel sounds are normal. She exhibits no distension. There is no tenderness. There is no rebound and no guarding.  Musculoskeletal: Normal range of motion. She exhibits no edema and no tenderness.  Neurological: She is alert and oriented to person, place, and time. Coordination normal.  Skin: Skin is warm and dry. No rash noted. She is not diaphoretic. No erythema. No pallor.  Psychiatric: She has a normal mood and affect. Her behavior is normal. Judgment and thought content normal.     EKG: Normal sinus rhythm with no premature beats.  ASSESSMENT AND PLAN

## 2015-05-07 NOTE — Patient Instructions (Signed)
Medication Instructions: Continue same medications.   Labwork: None.   Procedures/Testing: None.   Follow-Up: 1 year with Dr. Arida  Any Additional Special Instructions Will Be Listed Below (If Applicable).   

## 2015-05-07 NOTE — Assessment & Plan Note (Signed)
This was moderate on TEE and due to mitral valve prolapse. Continue to monitor every 2-3 years with echo. I will consider repeat echocardiogram in 2017.

## 2015-05-07 NOTE — Assessment & Plan Note (Signed)
These have improved significantly with the addition of metoprolol. Continue same treatment.

## 2015-10-10 ENCOUNTER — Other Ambulatory Visit: Payer: Self-pay | Admitting: Internal Medicine

## 2015-10-10 DIAGNOSIS — F17200 Nicotine dependence, unspecified, uncomplicated: Secondary | ICD-10-CM | POA: Diagnosis not present

## 2015-10-10 DIAGNOSIS — K581 Irritable bowel syndrome with constipation: Secondary | ICD-10-CM | POA: Diagnosis not present

## 2015-10-10 DIAGNOSIS — Z1231 Encounter for screening mammogram for malignant neoplasm of breast: Secondary | ICD-10-CM

## 2015-10-10 DIAGNOSIS — B351 Tinea unguium: Secondary | ICD-10-CM | POA: Diagnosis not present

## 2015-10-10 DIAGNOSIS — I341 Nonrheumatic mitral (valve) prolapse: Secondary | ICD-10-CM | POA: Diagnosis not present

## 2015-10-10 DIAGNOSIS — Z1239 Encounter for other screening for malignant neoplasm of breast: Secondary | ICD-10-CM | POA: Diagnosis not present

## 2015-10-25 ENCOUNTER — Ambulatory Visit
Admission: RE | Admit: 2015-10-25 | Discharge: 2015-10-25 | Disposition: A | Discharge status: Home / Self Care | Payer: PPO | Source: Ambulatory Visit | Attending: Internal Medicine | Admitting: Internal Medicine

## 2015-10-25 DIAGNOSIS — Z1231 Encounter for screening mammogram for malignant neoplasm of breast: Secondary | ICD-10-CM | POA: Insufficient documentation

## 2015-12-06 DIAGNOSIS — H2513 Age-related nuclear cataract, bilateral: Secondary | ICD-10-CM | POA: Diagnosis not present

## 2016-01-03 ENCOUNTER — Other Ambulatory Visit: Payer: Self-pay | Admitting: Cardiovascular Disease

## 2016-01-03 NOTE — Telephone Encounter (Signed)
Pt would like refill on Metoprolol

## 2016-04-17 DIAGNOSIS — H15101 Unspecified episcleritis, right eye: Secondary | ICD-10-CM | POA: Diagnosis not present

## 2016-05-02 DIAGNOSIS — L03032 Cellulitis of left toe: Secondary | ICD-10-CM | POA: Diagnosis not present

## 2016-05-02 DIAGNOSIS — L03031 Cellulitis of right toe: Secondary | ICD-10-CM | POA: Diagnosis not present

## 2016-05-02 DIAGNOSIS — L03039 Cellulitis of unspecified toe: Secondary | ICD-10-CM | POA: Diagnosis not present

## 2016-05-15 ENCOUNTER — Encounter: Payer: Self-pay | Admitting: Cardiovascular Disease

## 2016-05-15 ENCOUNTER — Ambulatory Visit (INDEPENDENT_AMBULATORY_CARE_PROVIDER_SITE_OTHER): Payer: PPO | Admitting: Cardiovascular Disease

## 2016-05-15 VITALS — BP 114/60 | HR 59 | Ht 59.0 in | Wt 120.5 lb

## 2016-05-15 DIAGNOSIS — Z720 Tobacco use: Secondary | ICD-10-CM

## 2016-05-15 DIAGNOSIS — I340 Nonrheumatic mitral (valve) insufficiency: Secondary | ICD-10-CM

## 2016-05-15 DIAGNOSIS — I493 Ventricular premature depolarization: Secondary | ICD-10-CM

## 2016-05-15 NOTE — Progress Notes (Signed)
Cardiology Office Note   Date:  05/15/2016   ID:  Nicole Norman, DOB 07-Jan-1946, MRN [id number]  PCP:  Nicole Harrier, MD  Cardiologist:   Nicole Sacramento, MD   Chief Complaint  Patient presents with  . other    12 month follow up. Meds reviewed by the pt. verbally. "doing well."       History of Present Illness: Nicole Norman is a 70 y.o. female who presents for a follow-up visit regarding PVCs and mitral regurgitation.She has no history of diabetes, hypertension or hyperlipidemia. She does have prolonged history of tobacco use and continues to smoke almost one pack per day.  Holter monitor in 2015 showed a total of 26,000 PVCs in 48 hours.  Nuclear stress test in 2015 showed normal LV systolic function with no evidence of ischemia. Echocardiogram showed an ejection fraction of 60-65%, moderate posterior mitral valve prolapse with moderate to severe mitral regurgitation.  Symptoms improved with metoprolol. She has been doing well and denies any chest pain or worsening dyspnea. No palpitations, dizziness or syncope. Unfortunately, she continues to smoke.   Past Medical History:  Diagnosis Date  . Dysrhythmia    "Fast and skips beats"  Reports recent echo and stress with Dr Fletcher Anon - [facility]  . Migraine    1x/month - uses Aleve  . Shortness of breath dyspnea    1 flight stairs - winded  . Vertigo    no meds    Past Surgical History:  Procedure Laterality Date  . CESAREAN SECTION    . CHOLECYSTECTOMY    . COLONOSCOPY N/A 12/07/2014   Procedure: COLONOSCOPY;  Surgeon: Nicole Lame, MD;  Location: [place];  Service: Gastroenterology;  Laterality: N/A;  . ESOPHAGOGASTRODUODENOSCOPY N/A 12/07/2014   Procedure: ESOPHAGOGASTRODUODENOSCOPY (EGD);  Surgeon: Nicole Lame, MD;  Location: [place];  Service: Gastroenterology;  Laterality: N/A;  . TONSILLECTOMY       Current Outpatient Prescriptions  Medication Sig Dispense Refill  . aspirin 81 MG  tablet Take 81 mg by mouth daily. AM    . Calcium Carbonate-Vitamin D (CALCIUM-VITAMIN D) 500-200 MG-UNIT per tablet Take 1 tablet by mouth daily. 10 AM    . Cholecalciferol (VITAMIN D3) 2000 UNITS TABS Take 1 tablet by mouth daily. 10 AM    . FIBER SELECT GUMMIES PO Take by mouth. 07:30 AM    . Inulin (FIBER CHOICE PO) Take by mouth daily. 07:30 AM    . metoprolol tartrate (LOPRESSOR) 25 MG tablet TAKE ONE TABLET BY MOUTH TWICE DAILY 180 tablet 3  . polyethylene glycol (MIRALAX / GLYCOLAX) packet Take 17 g by mouth daily.     No current facility-administered medications for this visit.     Allergies:   Review of patient's allergies indicates no known allergies.    Social History:  The patient  reports that she has been smoking Cigarettes.  She has a 22.50 pack-year smoking history. She has never used smokeless tobacco. She reports that she does not drink alcohol or use drugs.   Family History:  The patient's family history includes Alzheimer's disease in her mother; Breast cancer (age of onset: 68) in her sister; Cancer in her brother, brother, sister, and sister; Stroke in her father.    ROS:  Please see the history of present illness.   Otherwise, review of systems are positive for none.   All other systems are reviewed and negative.    PHYSICAL EXAM: VS:  BP 114/60 (BP Location: Left Arm,  Patient Position: Sitting, Cuff Size: Normal)   Pulse (!) 59   Ht 4\' 11"  (1.499 m)   Wt 120 lb 8 oz (54.7 kg)   BMI 24.34 kg/m  , BMI Body mass index is 24.34 kg/m. GEN: Well nourished, well developed, in no acute distress  HEENT: normal  Neck: no JVD, carotid bruits, or masses Cardiac: RRR with premature beats; no  rubs, or gallops,no edema . 2/6 holosystolic murmur at the apex radiating to the axilla and left sternal border Respiratory:  clear to auscultation bilaterally, normal work of breathing GI: soft, nontender, nondistended, + BS MS: no deformity or atrophy  Skin: warm and dry, no  rash Neuro:  Strength and sensation are intact Psych: euthymic mood, full affect   EKG:  EKG is not ordered today.    Recent Labs: No results found for requested labs within last 8760 hours.    Lipid Panel No results found for: CHOL, TRIG, HDL, CHOLHDL, VLDL, LDLCALC, LDLDIRECT    Wt Readings from Last 3 Encounters:  05/15/16 120 lb 8 oz (54.7 kg)  05/07/15 124 lb (56.2 kg)  12/07/14 116 lb (52.6 kg)       No flowsheet data found.    ASSESSMENT AND PLAN:  1.  Mitral valve disease with mitral valve prolapse and moderate to severe mitral regurgitation: She continues to be overall asymptomatic. I requested a follow-up echocardiogram as most recent echo was done in 2015.  2. PVCs: Symptoms are controlled with metoprolol although she continues to have premature beats by exam. Echo will help [place] determine her LV systolic function to make sure there is no evidence of cardiomyopathy.  3. Tobacco use: I again discussed with her the importance of smoking cessation but she mentions difficulty related to stress at home especially with recent illness of her husband.    Disposition:   FU with me in 1 year  Signed,  Nicole Sacramento, MD  05/15/2016 9:12 AM    [place]

## 2016-05-15 NOTE — Patient Instructions (Addendum)
Medication Instructions:  Your physician recommends that you continue on your current medications as directed. Please refer to the Current Medication list given to you today.   Labwork: none  Testing/Procedures: Your physician has requested that you have an echocardiogram. Echocardiography is a painless test that uses sound waves to create images of your heart. It provides your doctor with information about the size and shape of your heart and how well your heart's chambers and valves are working. This procedure takes approximately one hour. There are no restrictions for this procedure.    Follow-Up: Your physician wants you to follow-up in: one year with Dr. Arida.  You will receive a reminder letter in the mail two months in advance. If you don't receive a letter, please call our office to schedule the follow-up appointment.   Any Other Special Instructions Will Be Listed Below (If Applicable).     If you need a refill on your cardiac medications before your next appointment, please call your pharmacy.  Echocardiogram An echocardiogram, or echocardiography, uses sound waves (ultrasound) to produce an image of your heart. The echocardiogram is simple, painless, obtained within a short period of time, and offers valuable information to your health care provider. The images from an echocardiogram can provide information such as:  Evidence of coronary artery disease (CAD).  Heart size.  Heart muscle function.  Heart valve function.  Aneurysm detection.  Evidence of a past heart attack.  Fluid buildup around the heart.  Heart muscle thickening.  Assess heart valve function. LET YOUR HEALTH CARE PROVIDER KNOW ABOUT:  Any allergies you have.  All medicines you are taking, including vitamins, herbs, eye drops, creams, and over-the-counter medicines.  Previous problems you or members of your family have had with the use of anesthetics.  Any blood disorders you  have.  Previous surgeries you have had.  Medical conditions you have.  Possibility of pregnancy, if this applies. BEFORE THE PROCEDURE  No special preparation is needed. Eat and drink normally.  PROCEDURE   In order to produce an image of your heart, gel will be applied to your chest and a wand-like tool (transducer) will be moved over your chest. The gel will help transmit the sound waves from the transducer. The sound waves will harmlessly bounce off your heart to allow the heart images to be captured in real-time motion. These images will then be recorded.  You may need an IV to receive a medicine that improves the quality of the pictures. AFTER THE PROCEDURE You may return to your normal schedule including diet, activities, and medicines, unless your health care provider tells you otherwise.   This information is not intended to replace advice given to you by your health care provider. Make sure you discuss any questions you have with your health care provider.   Document Released: 07/18/2000 Document Revised: 08/11/2014 Document Reviewed: 03/28/2013 Elsevier Interactive Patient Education 2016 [employer]  

## 2016-05-22 DIAGNOSIS — L03032 Cellulitis of left toe: Secondary | ICD-10-CM | POA: Diagnosis not present

## 2016-05-22 DIAGNOSIS — L03031 Cellulitis of right toe: Secondary | ICD-10-CM | POA: Diagnosis not present

## 2016-06-05 ENCOUNTER — Other Ambulatory Visit: Payer: Self-pay

## 2016-06-05 ENCOUNTER — Ambulatory Visit (INDEPENDENT_AMBULATORY_CARE_PROVIDER_SITE_OTHER): Payer: PPO

## 2016-06-05 DIAGNOSIS — I340 Nonrheumatic mitral (valve) insufficiency: Secondary | ICD-10-CM

## 2016-06-05 DIAGNOSIS — I493 Ventricular premature depolarization: Secondary | ICD-10-CM

## 2016-06-05 NOTE — Patient Instructions (Signed)
Medication Instructions: - Your physician recommends that you continue on your current medications as directed. Please refer to the Current Medication list given to you today.  Labwork: - none ordered  Procedures/Testing: - Your physician has recommended that you wear a 24 hour holter monitor. Holter monitors are medical devices that record the heart's electrical activity. Doctors most often use these monitors to diagnose arrhythmias. Arrhythmias are problems with the speed or rhythm of the heartbeat. The monitor is a small, portable device. You can wear one while you do your normal daily activities. This is usually used to diagnose what is causing palpitations/syncope (passing out).  Follow-Up: - Your physician recommends that you schedule a follow-up appointment in: just after holter with Dr. Fletcher Anon  Any Additional Special Instructions Will Be Listed Below (If Applicable).     If you need a refill on your cardiac medications before your next appointment, please call your pharmacy.

## 2016-06-13 ENCOUNTER — Inpatient Hospital Stay
Admission: EM | Admit: 2016-06-13 | Discharge: 2016-06-19 | DRG: 286 | Disposition: A | Discharge status: Other Acute Inpatient Hospital | Payer: PPO | Attending: Internal Medicine | Admitting: Internal Medicine

## 2016-06-13 ENCOUNTER — Emergency Department: Payer: PPO

## 2016-06-13 DIAGNOSIS — F17200 Nicotine dependence, unspecified, uncomplicated: Secondary | ICD-10-CM | POA: Diagnosis present

## 2016-06-13 DIAGNOSIS — I5031 Acute diastolic (congestive) heart failure: Principal | ICD-10-CM | POA: Diagnosis present

## 2016-06-13 DIAGNOSIS — I272 Pulmonary hypertension, unspecified: Secondary | ICD-10-CM | POA: Diagnosis present

## 2016-06-13 DIAGNOSIS — I5030 Unspecified diastolic (congestive) heart failure: Secondary | ICD-10-CM | POA: Diagnosis not present

## 2016-06-13 DIAGNOSIS — J9601 Acute respiratory failure with hypoxia: Secondary | ICD-10-CM | POA: Diagnosis present

## 2016-06-13 DIAGNOSIS — R0603 Acute respiratory distress: Secondary | ICD-10-CM | POA: Diagnosis not present

## 2016-06-13 DIAGNOSIS — Z7982 Long term (current) use of aspirin: Secondary | ICD-10-CM

## 2016-06-13 DIAGNOSIS — I472 Ventricular tachycardia: Secondary | ICD-10-CM | POA: Diagnosis present

## 2016-06-13 DIAGNOSIS — J441 Chronic obstructive pulmonary disease with (acute) exacerbation: Secondary | ICD-10-CM | POA: Diagnosis not present

## 2016-06-13 DIAGNOSIS — J449 Chronic obstructive pulmonary disease, unspecified: Secondary | ICD-10-CM | POA: Diagnosis not present

## 2016-06-13 DIAGNOSIS — I248 Other forms of acute ischemic heart disease: Secondary | ICD-10-CM | POA: Diagnosis present

## 2016-06-13 DIAGNOSIS — Z23 Encounter for immunization: Secondary | ICD-10-CM | POA: Diagnosis not present

## 2016-06-13 DIAGNOSIS — R0609 Other forms of dyspnea: Secondary | ICD-10-CM

## 2016-06-13 DIAGNOSIS — I2510 Atherosclerotic heart disease of native coronary artery without angina pectoris: Secondary | ICD-10-CM | POA: Diagnosis present

## 2016-06-13 DIAGNOSIS — I509 Heart failure, unspecified: Secondary | ICD-10-CM | POA: Diagnosis not present

## 2016-06-13 DIAGNOSIS — I471 Supraventricular tachycardia: Secondary | ICD-10-CM

## 2016-06-13 DIAGNOSIS — J181 Lobar pneumonia, unspecified organism: Secondary | ICD-10-CM | POA: Diagnosis not present

## 2016-06-13 DIAGNOSIS — R0602 Shortness of breath: Secondary | ICD-10-CM | POA: Diagnosis present

## 2016-06-13 DIAGNOSIS — I340 Nonrheumatic mitral (valve) insufficiency: Secondary | ICD-10-CM | POA: Diagnosis present

## 2016-06-13 DIAGNOSIS — J440 Chronic obstructive pulmonary disease with acute lower respiratory infection: Secondary | ICD-10-CM | POA: Diagnosis not present

## 2016-06-13 DIAGNOSIS — R0600 Dyspnea, unspecified: Secondary | ICD-10-CM | POA: Diagnosis present

## 2016-06-13 DIAGNOSIS — J9600 Acute respiratory failure, unspecified whether with hypoxia or hypercapnia: Secondary | ICD-10-CM | POA: Diagnosis not present

## 2016-06-13 DIAGNOSIS — Z79899 Other long term (current) drug therapy: Secondary | ICD-10-CM | POA: Diagnosis not present

## 2016-06-13 DIAGNOSIS — J189 Pneumonia, unspecified organism: Secondary | ICD-10-CM

## 2016-06-13 DIAGNOSIS — I5043 Acute on chronic combined systolic (congestive) and diastolic (congestive) heart failure: Secondary | ICD-10-CM | POA: Diagnosis present

## 2016-06-13 DIAGNOSIS — I493 Ventricular premature depolarization: Secondary | ICD-10-CM | POA: Diagnosis not present

## 2016-06-13 DIAGNOSIS — I341 Nonrheumatic mitral (valve) prolapse: Secondary | ICD-10-CM | POA: Diagnosis not present

## 2016-06-13 DIAGNOSIS — R918 Other nonspecific abnormal finding of lung field: Secondary | ICD-10-CM | POA: Diagnosis not present

## 2016-06-13 DIAGNOSIS — J9690 Respiratory failure, unspecified, unspecified whether with hypoxia or hypercapnia: Secondary | ICD-10-CM | POA: Diagnosis not present

## 2016-06-13 DIAGNOSIS — R911 Solitary pulmonary nodule: Secondary | ICD-10-CM

## 2016-06-13 DIAGNOSIS — I700 Atherosclerosis of aorta: Secondary | ICD-10-CM | POA: Diagnosis present

## 2016-06-13 DIAGNOSIS — I959 Hypotension, unspecified: Secondary | ICD-10-CM | POA: Diagnosis present

## 2016-06-13 DIAGNOSIS — F17210 Nicotine dependence, cigarettes, uncomplicated: Secondary | ICD-10-CM | POA: Diagnosis present

## 2016-06-13 DIAGNOSIS — R262 Difficulty in walking, not elsewhere classified: Secondary | ICD-10-CM

## 2016-06-13 DIAGNOSIS — J810 Acute pulmonary edema: Secondary | ICD-10-CM | POA: Diagnosis not present

## 2016-06-13 HISTORY — DX: Solitary pulmonary nodule: R91.1

## 2016-06-13 LAB — CBC
HCT: 39.8 % (ref 35.0–47.0)
HEMOGLOBIN: 13.2 g/dL (ref 12.0–16.0)
MCH: 30.6 pg (ref 26.0–34.0)
MCHC: 33 g/dL (ref 32.0–36.0)
MCV: 92.6 fL (ref 80.0–100.0)
Platelets: 196 10*3/uL (ref 150–440)
RBC: 4.3 MIL/uL (ref 3.80–5.20)
RDW: 13.3 % (ref 11.5–14.5)
WBC: 20.4 10*3/uL — AB (ref 3.6–11.0)

## 2016-06-13 LAB — BASIC METABOLIC PANEL
Anion gap: 10 (ref 5–15)
BUN: 11 mg/dL (ref 6–20)
CO2: 22 mmol/L (ref 22–32)
Calcium: 8.8 mg/dL — ABNORMAL LOW (ref 8.9–10.3)
Chloride: 106 mmol/L (ref 101–111)
Creatinine, Ser: 0.78 mg/dL (ref 0.44–1.00)
GFR calc Af Amer: >60 mL/min (ref >60)
GFR calc non Af Amer: >60 mL/min (ref >60)
Glucose, Bld: 208 mg/dL — ABNORMAL HIGH (ref 65–99)
POTASSIUM: 3.6 mmol/L (ref 3.5–5.1)
SODIUM: 138 mmol/L (ref 135–145)

## 2016-06-13 LAB — TROPONIN I: TROPONIN I: 0.05 ng/mL — AB (ref <0.03)

## 2016-06-13 LAB — BRAIN NATRIURETIC PEPTIDE: B Natriuretic Peptide: 378 pg/mL — ABNORMAL HIGH (ref 0.0–100.0)

## 2016-06-13 LAB — FIBRIN DERIVATIVES D-DIMER (ARMC ONLY): FIBRIN DERIVATIVES D-DIMER (ARMC): 987 — AB (ref 0–499)

## 2016-06-13 MED ORDER — DEXTROSE 5 % IV SOLN: 1.0000 g | Freq: Once | INTRAVENOUS | Status: DC

## 2016-06-13 MED ORDER — SODIUM CHLORIDE 0.9 % IV BOLUS (SEPSIS)
1000.0000 mL | Freq: Once | INTRAVENOUS | Status: AC
  Administered 2016-06-13: 1000 mL via INTRAVENOUS

## 2016-06-13 MED ORDER — CEFTRIAXONE SODIUM-DEXTROSE 1-3.74 GM-% IV SOLR
1.0000 g | Freq: Once | INTRAVENOUS | Status: AC
  Administered 2016-06-14: 1 g via INTRAVENOUS
  Filled 2016-06-13: qty 50

## 2016-06-13 MED ORDER — SODIUM CHLORIDE 0.9 % IV BOLUS (SEPSIS)
500.0000 mL | Freq: Once | INTRAVENOUS | Status: AC
  Administered 2016-06-14: 500 mL via INTRAVENOUS

## 2016-06-13 MED ORDER — DEXTROSE 5 % IV SOLN
500.0000 mg | Freq: Once | INTRAVENOUS | Status: AC
  Administered 2016-06-13: 500 mg via INTRAVENOUS
  Filled 2016-06-13: qty 500

## 2016-06-13 MED ORDER — SODIUM CHLORIDE 0.9 % IV BOLUS (SEPSIS)
250.0000 mL | Freq: Once | INTRAVENOUS | Status: AC
  Administered 2016-06-14: 250 mL via INTRAVENOUS

## 2016-06-13 NOTE — ED Provider Notes (Signed)
[facility] Emergency Department Provider Note   First MD Initiated Contact with Patient 06/13/16 2300     (approximate)  I have reviewed the triage vital signs and the nursing notes.   HISTORY  Chief Complaint Respiratory Distress    HPI Nicole Norman is a 70 y.o. female with below listed chronic medical conditions presents emergency department with acute onset of dyspnea at 9:30 PM. Patient states that symptoms are progressively worsened since that time. Patient admits to productive cough over the past few days however denies any fever. Patient denies any chest pain. Patient admits to lower extremity discomfort bilaterally. Patient noted to be hypotensive on presentation blood pressure 90/64 tachypnea with a respiratory rate of 26. Given respiratory distress EMS applied CPAP in route. In addition EMS administered one DuoNeb.   Past Medical History:  Diagnosis Date  . Dysrhythmia    "Fast and skips beats"  Reports recent echo and stress with Dr Fletcher Anon - [facility]  . Migraine    1x/month - uses Aleve  . Shortness of breath dyspnea    1 flight stairs - winded  . Vertigo    no meds    Patient Active Problem List   Diagnosis Date Noted  . DOE (dyspnea on exertion) 11/28/2014  . Constipation 11/09/2014  . Mitral regurgitation 08/02/2014  . Tobacco use 08/02/2014  . PVCs (premature ventricular contractions) 07/16/2014  . SOB (shortness of breath) 07/16/2014    Past Surgical History:  Procedure Laterality Date  . CESAREAN SECTION    . CHOLECYSTECTOMY    . COLONOSCOPY N/A 12/07/2014   Procedure: COLONOSCOPY;  Surgeon: Lucilla Lame, MD;  Location: [place];  Service: Gastroenterology;  Laterality: N/A;  . ESOPHAGOGASTRODUODENOSCOPY N/A 12/07/2014   Procedure: ESOPHAGOGASTRODUODENOSCOPY (EGD);  Surgeon: Lucilla Lame, MD;  Location: [place];  Service: Gastroenterology;  Laterality: N/A;  . TONSILLECTOMY      Prior to Admission  medications   Medication Sig Start Date End Date Taking? Authorizing Provider  aspirin 81 MG tablet Take 81 mg by mouth daily. AM   Yes Historical Provider, MD  Calcium Carbonate-Vitamin D (CALCIUM-VITAMIN D) 500-200 MG-UNIT per tablet Take 1 tablet by mouth daily. 10 AM   Yes Historical Provider, MD  Cholecalciferol (VITAMIN D3) 2000 UNITS TABS Take 1 tablet by mouth daily. 10 AM   Yes Historical Provider, MD  [place] Take by mouth. 07:30 AM   Yes Historical Provider, MD  Inulin (FIBER CHOICE PO) Take by mouth daily. 07:30 AM   Yes Historical Provider, MD  metoprolol tartrate (LOPRESSOR) 25 MG tablet TAKE ONE TABLET BY MOUTH TWICE DAILY 01/03/16  Yes Wellington Hampshire, MD  polyethylene glycol (MIRALAX / GLYCOLAX) packet Take 17 g by mouth daily.    Historical Provider, MD    Allergies Patient has no known allergies.  Family History  Problem Relation Age of Onset  . Stroke Father   . Alzheimer's disease Mother   . Cancer Brother   . Cancer Brother   . Cancer Sister     breast  . Breast cancer Sister 64  . Cancer Sister     breast    Social History Social History  Substance Use Topics  . Smoking status: Current Every Day Smoker    Packs/day: 0.50    Years: 45.00    Types: Cigarettes  . Smokeless tobacco: Never Used  . Alcohol use No     Comment: occasional    Review of Systems  Constitutional: No fever/chills Eyes: No visual changes. ENT: No sore throat. Cardiovascular: Denies chest pain. Respiratory: Positive for shortness of breath. Positive for cough Gastrointestinal: No abdominal pain.  No nausea, no vomiting.  No diarrhea.  No constipation. Genitourinary: Negative for dysuria. Musculoskeletal: Negative for back pain. Skin: Negative for rash. Neurological: Negative for headaches, focal weakness or numbness.  10-point ROS otherwise negative.  ____________________________________________   PHYSICAL EXAM:  VITAL SIGNS: ED Triage Vitals  Enc  Vitals Group     BP 06/13/16 2256 98/64     Pulse Rate 06/13/16 2256 99     Resp 06/13/16 2256 (!) 26     Temp 06/13/16 2256 97.5 F (36.4 C)     Temp Source 06/13/16 2256 Axillary     SpO2 06/13/16 2256 99 %     Weight 06/13/16 2256 125 lb (56.7 kg)     Height 06/13/16 2256 4\' 11"  (1.499 m)     Head Circumference --      Peak Flow --      Pain Score 06/13/16 2257 0     Pain Loc --      Pain Edu? --      Excl. in [place]? --     Constitutional: Alert and oriented. Apparent respiratory distress tolerating BiPAP. Eyes: Conjunctivae are normal. PERRL. EOMI. Head: Atraumatic. Nose: No congestion/rhinnorhea. Mouth/Throat: Mucous membranes are moist.  Oropharynx non-erythematous. Neck: No stridor.  No cervical spine tenderness to palpation. Cardiovascular: Systolic ejection murmur,Good peripheral circulation. Grossly normal heart sounds. Respiratory: Tachypnea, bibasilar rhonchi accessory respiratory muscle use. Gastrointestinal: Soft and nontender. No distention.  Musculoskeletal: No lower extremity tenderness nor edema. No gross deformities of extremities. Neurologic:  Normal speech and language. No gross focal neurologic deficits are appreciated.  Skin:  Skin is warm, dry and intact. No rash noted. Psychiatric: Mood and affect are normal. Speech and behavior are normal.  ____________________________________________   LABS (all labs ordered are listed, but only abnormal results are displayed)  Labs Reviewed  BASIC METABOLIC PANEL - Abnormal; Notable for the following:       Result Value   Glucose, Bld 208 (*)    Calcium 8.8 (*)    All other components within normal limits  CBC - Abnormal; Notable for the following:    WBC 20.4 (*)    All other components within normal limits  TROPONIN I - Abnormal; Notable for the following:    Troponin I 0.05 (*)    All other components within normal limits  CULTURE, BLOOD (ROUTINE X 2)  CULTURE, BLOOD (ROUTINE X 2)  BRAIN NATRIURETIC  PEPTIDE  FIBRIN DERIVATIVES D-DIMER ([facility] ONLY)  LACTIC ACID, PLASMA   ____________________________________________  EKG  ED ECG REPORT I, [place] Theon Sobotka, the attending physician, personally viewed and interpreted this ECG.   Date: 06/14/2016  EKG Time: 10:54 PM  Rate: 100  Rhythm: Normal sinus rhythm  Axis: Normal  Intervals: Normal  ST&T Change: None  ____________________________________________  RADIOLOGY I, [place] Rickey Farrier, personally viewed and evaluated these images (plain radiographs) as part of my medical decision making, as well as reviewing the written report by the radiologist.  Dg Chest Port 1 View  Result Date: 06/13/2016 CLINICAL DATA:  70 y/o F; respiratory distress and feeling she could not breathe. EXAM: PORTABLE CHEST 1 VIEW COMPARISON:  07/15/2013 chest radiograph. FINDINGS: The stable cardiomediastinal silhouette within normal limits given projection and technique. Increased interstitial markings probably represents interstitial edema. Ill-defined opacity right lung base may represent atelectasis or  pneumonia. No acute osseous abnormality is evident. IMPRESSION: Stable cardiac silhouette. Interstitial edema. Ill-defined right basilar opacity may represent atelectasis or pneumonia. Electronically Signed   By: Kristine Garbe M.D.   On: 06/13/2016 23:30      Procedures   Critical Care performed: CRITICAL CARE Performed by: Gregor Hams   Total critical care time: 40 minutes  Critical care time was exclusive of separately billable procedures and treating other patients.  Critical care was necessary to treat or prevent imminent or life-threatening deterioration.  Critical care was time spent personally by me on the following activities: development of treatment plan with patient and/or surrogate as well as nursing, discussions with consultants, evaluation of patient's response to treatment, examination of patient, obtaining history from  patient or surrogate, ordering and performing treatments and interventions, ordering and review of laboratory studies, ordering and review of radiographic studies, pulse oximetry and re-evaluation of patient's condition.  ____________________________________________   INITIAL IMPRESSION / ASSESSMENT AND PLAN / ED COURSE  Pertinent labs & imaging results that were available during my care of the patient were reviewed by me and considered in my medical decision making (see chart for details).  BiPAP applied on arrival to emergency department. Given history physical examination vital signs concerning for possible sepsis as such patient received IV antibiotics 30 ML's per kilogram of IV normal saline. Laboratory data revealed a leukocytosis 20.9 lactic acid elevated at 3.8., Elevated troponin 0.05 Patient discussed with Dr. Marcille Blanco for hospital admission for further evaluation and management.   Clinical Course     ____________________________________________  FINAL CLINICAL IMPRESSION(S) / ED DIAGNOSES  Final diagnoses:  Acute respiratory failure, unspecified whether with hypoxia or hypercapnia (HCC)  Multifocal pneumonia     MEDICATIONS GIVEN DURING THIS VISIT:  Medications  sodium chloride 0.9 % bolus 1,000 mL (not administered)    And  sodium chloride 0.9 % bolus 500 mL (not administered)    And  sodium chloride 0.9 % bolus 250 mL (not administered)  azithromycin (ZITHROMAX) 500 mg in dextrose 5 % 250 mL IVPB (not administered)  cefTRIAXone (ROCEPHIN) IVPB 1 g (not administered)     NEW OUTPATIENT MEDICATIONS STARTED DURING THIS VISIT:  New Prescriptions   No medications on file    Modified Medications   No medications on file    Discontinued Medications   No medications on file     Note:  This document was prepared using Dragon voice recognition software and may include unintentional dictation errors.    Gregor Hams, MD 06/14/16 [phone]

## 2016-06-13 NOTE — Progress Notes (Signed)
Pharmacy Antibiotic Note  Nicole Norman is a 70 y.o. female admitted on 06/13/2016 with PNA.  Pharmacy has been consulted for ceftriaxone and azithromycin dosing.  Plan: 1. Ceftriaxone 1 gm IV Q24H 2. Azithromycin 500 mg IV Q24H  Height: 4\' 11"  (149.9 cm) Weight: 125 lb (56.7 kg) IBW/kg (Calculated) : 43.2  Temp (24hrs), Avg:97.5 F (36.4 C), Min:97.5 F (36.4 C), Max:97.5 F (36.4 C)   Recent Labs Lab 06/13/16 2300  WBC 20.4*  CREATININE 0.78    Estimated Creatinine Clearance: 52.4 mL/min (by C-G formula based on SCr of 0.78 mg/dL).    No Known Allergies  Thank you for allowing pharmacy to be a part of this patient's care.  Laural Benes, Pharm.D., BCPS Clinical Pharmacist 06/13/2016 11:40 PM

## 2016-06-13 NOTE — ED Triage Notes (Signed)
Pt bib EMS from home w/ c/o resp distress.  Pt sts she was laying down for bed and felt like she couldn't breath.  Pt denies CP, n/v/d, fever, recent illness or dizziness.  Pt alert and oriented x 4.  Resp labored.  EMS administered 125 mg solumedrol, 1 albuterol tx and 1 duoneb.  Pt placed on BIPAP.  Per EMS, pt 70% RA

## 2016-06-14 ENCOUNTER — Emergency Department: Payer: PPO

## 2016-06-14 DIAGNOSIS — I509 Heart failure, unspecified: Secondary | ICD-10-CM | POA: Diagnosis not present

## 2016-06-14 DIAGNOSIS — J9600 Acute respiratory failure, unspecified whether with hypoxia or hypercapnia: Secondary | ICD-10-CM | POA: Diagnosis not present

## 2016-06-14 DIAGNOSIS — I5031 Acute diastolic (congestive) heart failure: Secondary | ICD-10-CM | POA: Diagnosis not present

## 2016-06-14 DIAGNOSIS — J440 Chronic obstructive pulmonary disease with acute lower respiratory infection: Secondary | ICD-10-CM | POA: Diagnosis not present

## 2016-06-14 DIAGNOSIS — J9601 Acute respiratory failure with hypoxia: Secondary | ICD-10-CM | POA: Diagnosis not present

## 2016-06-14 DIAGNOSIS — F17210 Nicotine dependence, cigarettes, uncomplicated: Secondary | ICD-10-CM | POA: Diagnosis not present

## 2016-06-14 DIAGNOSIS — J9690 Respiratory failure, unspecified, unspecified whether with hypoxia or hypercapnia: Secondary | ICD-10-CM | POA: Diagnosis not present

## 2016-06-14 DIAGNOSIS — I471 Supraventricular tachycardia: Secondary | ICD-10-CM | POA: Diagnosis not present

## 2016-06-14 DIAGNOSIS — I472 Ventricular tachycardia: Secondary | ICD-10-CM | POA: Diagnosis not present

## 2016-06-14 DIAGNOSIS — J181 Lobar pneumonia, unspecified organism: Secondary | ICD-10-CM | POA: Diagnosis not present

## 2016-06-14 DIAGNOSIS — Z7982 Long term (current) use of aspirin: Secondary | ICD-10-CM | POA: Diagnosis not present

## 2016-06-14 DIAGNOSIS — I2510 Atherosclerotic heart disease of native coronary artery without angina pectoris: Secondary | ICD-10-CM | POA: Diagnosis not present

## 2016-06-14 DIAGNOSIS — I959 Hypotension, unspecified: Secondary | ICD-10-CM | POA: Diagnosis not present

## 2016-06-14 DIAGNOSIS — R0603 Acute respiratory distress: Secondary | ICD-10-CM | POA: Diagnosis not present

## 2016-06-14 DIAGNOSIS — I493 Ventricular premature depolarization: Secondary | ICD-10-CM | POA: Diagnosis not present

## 2016-06-14 DIAGNOSIS — I5030 Unspecified diastolic (congestive) heart failure: Secondary | ICD-10-CM | POA: Diagnosis not present

## 2016-06-14 DIAGNOSIS — J189 Pneumonia, unspecified organism: Secondary | ICD-10-CM | POA: Diagnosis not present

## 2016-06-14 DIAGNOSIS — I700 Atherosclerosis of aorta: Secondary | ICD-10-CM | POA: Diagnosis not present

## 2016-06-14 DIAGNOSIS — J449 Chronic obstructive pulmonary disease, unspecified: Secondary | ICD-10-CM | POA: Diagnosis not present

## 2016-06-14 DIAGNOSIS — I248 Other forms of acute ischemic heart disease: Secondary | ICD-10-CM | POA: Diagnosis not present

## 2016-06-14 DIAGNOSIS — R918 Other nonspecific abnormal finding of lung field: Secondary | ICD-10-CM | POA: Diagnosis not present

## 2016-06-14 DIAGNOSIS — I341 Nonrheumatic mitral (valve) prolapse: Secondary | ICD-10-CM | POA: Diagnosis not present

## 2016-06-14 DIAGNOSIS — J441 Chronic obstructive pulmonary disease with (acute) exacerbation: Secondary | ICD-10-CM | POA: Diagnosis not present

## 2016-06-14 DIAGNOSIS — Z23 Encounter for immunization: Secondary | ICD-10-CM | POA: Diagnosis not present

## 2016-06-14 DIAGNOSIS — J810 Acute pulmonary edema: Secondary | ICD-10-CM | POA: Diagnosis not present

## 2016-06-14 DIAGNOSIS — I340 Nonrheumatic mitral (valve) insufficiency: Secondary | ICD-10-CM | POA: Diagnosis not present

## 2016-06-14 DIAGNOSIS — Z79899 Other long term (current) drug therapy: Secondary | ICD-10-CM | POA: Diagnosis not present

## 2016-06-14 DIAGNOSIS — R0602 Shortness of breath: Secondary | ICD-10-CM | POA: Diagnosis not present

## 2016-06-14 DIAGNOSIS — F17200 Nicotine dependence, unspecified, uncomplicated: Secondary | ICD-10-CM | POA: Diagnosis not present

## 2016-06-14 LAB — TROPONIN I
Troponin I: 0.11 ng/mL (ref <0.03)
Troponin I: 0.09 ng/mL (ref <0.03)
Troponin I: 0.08 ng/mL (ref <0.03)

## 2016-06-14 LAB — LACTIC ACID, PLASMA: LACTIC ACID, VENOUS: 3 mmol/L — AB (ref 0.5–1.9)

## 2016-06-14 LAB — MRSA PCR SCREENING: MRSA by PCR: NEGATIVE

## 2016-06-14 LAB — TSH: TSH: 1.287 u[IU]/mL (ref 0.350–4.500)

## 2016-06-14 MED ORDER — ENOXAPARIN SODIUM 40 MG/0.4ML ~~LOC~~ SOLN
40.0000 mg | SUBCUTANEOUS | Status: DC
  Administered 2016-06-14: 40 mg via SUBCUTANEOUS
  Filled 2016-06-14: qty 0.4

## 2016-06-14 MED ORDER — BUDESONIDE 0.25 MG/2ML IN SUSP
0.2500 mg | Freq: Two times a day (BID) | RESPIRATORY_TRACT | Status: DC
  Administered 2016-06-14 – 2016-06-15 (×2): 0.25 mg via RESPIRATORY_TRACT
  Filled 2016-06-14 (×2): qty 2

## 2016-06-14 MED ORDER — PNEUMOCOCCAL VAC POLYVALENT 25 MCG/0.5ML IJ INJ
0.5000 mL | INJECTION | INTRAMUSCULAR | Status: AC
  Administered 2016-06-15: 0.5 mL via INTRAMUSCULAR
  Filled 2016-06-14: qty 0.5

## 2016-06-14 MED ORDER — DILTIAZEM HCL ER COATED BEADS 180 MG PO CP24
180.0000 mg | ORAL_CAPSULE | Freq: Every day | ORAL | Status: DC
  Administered 2016-06-14: 180 mg via ORAL
  Filled 2016-06-14: qty 1

## 2016-06-14 MED ORDER — ASPIRIN EC 81 MG PO TBEC
81.0000 mg | DELAYED_RELEASE_TABLET | Freq: Every day | ORAL | Status: DC
  Administered 2016-06-14 – 2016-06-18 (×5): 81 mg via ORAL
  Filled 2016-06-14 (×5): qty 1

## 2016-06-14 MED ORDER — IPRATROPIUM-ALBUTEROL 0.5-2.5 (3) MG/3ML IN SOLN
3.0000 mL | Freq: Once | RESPIRATORY_TRACT | Status: AC
  Administered 2016-06-14: 3 mL via RESPIRATORY_TRACT

## 2016-06-14 MED ORDER — ACETAMINOPHEN 325 MG PO TABS
650.0000 mg | ORAL_TABLET | Freq: Four times a day (QID) | ORAL | Status: DC | PRN
  Administered 2016-06-14: 650 mg via ORAL
  Filled 2016-06-14: qty 2

## 2016-06-14 MED ORDER — ORAL CARE MOUTH RINSE
15.0000 mL | Freq: Two times a day (BID) | OROMUCOSAL | Status: DC
  Administered 2016-06-14: 15 mL via OROMUCOSAL

## 2016-06-14 MED ORDER — ENOXAPARIN SODIUM 40 MG/0.4ML ~~LOC~~ SOLN
40.0000 mg | SUBCUTANEOUS | Status: DC
  Administered 2016-06-14 – 2016-06-15 (×2): 40 mg via SUBCUTANEOUS
  Filled 2016-06-14 (×2): qty 0.4

## 2016-06-14 MED ORDER — METOPROLOL TARTRATE 25 MG PO TABS
25.0000 mg | ORAL_TABLET | Freq: Two times a day (BID) | ORAL | Status: DC
  Filled 2016-06-14: qty 1

## 2016-06-14 MED ORDER — ONDANSETRON HCL 4 MG PO TABS: 4.0000 mg | ORAL_TABLET | Freq: Four times a day (QID) | ORAL | Status: DC | PRN

## 2016-06-14 MED ORDER — IOPAMIDOL (ISOVUE-370) INJECTION 76%
75.0000 mL | Freq: Once | INTRAVENOUS | Status: AC | PRN
  Administered 2016-06-14: 75 mL via INTRAVENOUS

## 2016-06-14 MED ORDER — SODIUM CHLORIDE 0.9% FLUSH
3.0000 mL | Freq: Two times a day (BID) | INTRAVENOUS | Status: DC
  Administered 2016-06-14 (×3): 3 mL via INTRAVENOUS
  Administered 2016-06-15: 22:00:00 via INTRAVENOUS
  Administered 2016-06-15 – 2016-06-18 (×6): 3 mL via INTRAVENOUS

## 2016-06-14 MED ORDER — CHLORHEXIDINE GLUCONATE 0.12 % MT SOLN
15.0000 mL | Freq: Two times a day (BID) | OROMUCOSAL | Status: DC
  Administered 2016-06-14: 15 mL via OROMUCOSAL
  Filled 2016-06-14: qty 15

## 2016-06-14 MED ORDER — ACETAMINOPHEN 650 MG RE SUPP: 650.0000 mg | Freq: Four times a day (QID) | RECTAL | Status: DC | PRN

## 2016-06-14 MED ORDER — CALCIUM CARBONATE-VITAMIN D 500-200 MG-UNIT PO TABS
1.0000 | ORAL_TABLET | Freq: Every day | ORAL | Status: DC
  Administered 2016-06-14 – 2016-06-18 (×5): 1 via ORAL
  Filled 2016-06-14 (×5): qty 1

## 2016-06-14 MED ORDER — IPRATROPIUM-ALBUTEROL 0.5-2.5 (3) MG/3ML IN SOLN
3.0000 mL | Freq: Four times a day (QID) | RESPIRATORY_TRACT | Status: DC
  Administered 2016-06-14 – 2016-06-17 (×14): 3 mL via RESPIRATORY_TRACT
  Filled 2016-06-14 (×15): qty 3

## 2016-06-14 MED ORDER — ONDANSETRON HCL 4 MG/2ML IJ SOLN: 4.0000 mg | Freq: Four times a day (QID) | INTRAMUSCULAR | Status: DC | PRN

## 2016-06-14 MED ORDER — SODIUM CHLORIDE 0.9 % IV BOLUS (SEPSIS)
1000.0000 mL | Freq: Once | INTRAVENOUS | Status: AC
  Administered 2016-06-14: 1000 mL via INTRAVENOUS

## 2016-06-14 MED ORDER — DEXTROSE 5 % IV SOLN
500.0000 mg | Freq: Every day | INTRAVENOUS | Status: DC
  Administered 2016-06-14 – 2016-06-15 (×2): 500 mg via INTRAVENOUS
  Filled 2016-06-14 (×3): qty 500

## 2016-06-14 MED ORDER — DOCUSATE SODIUM 100 MG PO CAPS
100.0000 mg | ORAL_CAPSULE | Freq: Two times a day (BID) | ORAL | Status: DC
  Administered 2016-06-14 – 2016-06-18 (×8): 100 mg via ORAL
  Filled 2016-06-14 (×10): qty 1

## 2016-06-14 MED ORDER — VITAMIN D 1000 UNITS PO TABS
2000.0000 [IU] | ORAL_TABLET | Freq: Every day | ORAL | Status: DC
  Administered 2016-06-14 – 2016-06-18 (×5): 2000 [IU] via ORAL
  Filled 2016-06-14 (×5): qty 2

## 2016-06-14 MED ORDER — POLYETHYLENE GLYCOL 3350 17 G PO PACK: 17.0000 g | PACK | Freq: Every day | ORAL | Status: DC | PRN

## 2016-06-14 MED ORDER — IPRATROPIUM-ALBUTEROL 0.5-2.5 (3) MG/3ML IN SOLN
RESPIRATORY_TRACT | Status: AC
  Administered 2016-06-14: 3 mL via RESPIRATORY_TRACT
  Filled 2016-06-14: qty 6

## 2016-06-14 MED ORDER — CEFTRIAXONE SODIUM-DEXTROSE 1-3.74 GM-% IV SOLR
1.0000 g | Freq: Every day | INTRAVENOUS | Status: DC
  Administered 2016-06-14 – 2016-06-15 (×2): 1 g via INTRAVENOUS
  Filled 2016-06-14 (×4): qty 50

## 2016-06-14 MED ORDER — PREDNISONE 10 MG PO TABS
30.0000 mg | ORAL_TABLET | Freq: Every day | ORAL | Status: DC
  Administered 2016-06-14 – 2016-06-16 (×3): 30 mg via ORAL
  Filled 2016-06-14 (×4): qty 1

## 2016-06-14 NOTE — Progress Notes (Addendum)
Have spoken with patient and her husband this morning re smoking cessation.  Both report they would like to quit and verbalize understanding that it would be beneficial, but are not committing to doing so at this time.  Pt is now on room air at her request; O2 sats WDL.  She reports mild chest tightness, but says this is normal for her.

## 2016-06-14 NOTE — Progress Notes (Signed)
Dr Ara Kussmaul notified that pt had 2 episodes of SVT up to 160bpm.  Resolved spontaneously after 1-2 min. Pt symptomatic w/ lightheadedness and SOB.  Orders received for 1L NS bolus and call back if another symptomatic episode for adenosine order.

## 2016-06-14 NOTE — Progress Notes (Addendum)
Metoprolol held for low BP, will report to Dr Leslye Peer.  BP 90/63 after a recheck, HR 80s & 90s

## 2016-06-14 NOTE — Progress Notes (Addendum)
Pt has floor orders w/ tele.  Up to cabinet toilet, no s/sx distress, although she does seem slightly weak, no pain issues this shift.  BP normalized with recheck.  Pt has moved bowels and urinated this shift.  No continence issues.  A&O x 4.  On room air.  Agreeable to move to floor with telemetry monitor.   Prednisone taper started.  Dr Leslye Peer informed metoprolol was held for soft pressure.  He has discontinued for now as pressure remains soft and patient experienced such a sudden onset of SOB at home, may have been bronchospasm.

## 2016-06-14 NOTE — Progress Notes (Signed)
Start cardizem CD 180 per Dr Leslye Peer for sustained tach in 160s

## 2016-06-14 NOTE — Progress Notes (Signed)
Bipap on standby at bedside. Pt on room air. No distress noted.

## 2016-06-14 NOTE — Progress Notes (Signed)
Dr. Marcille Blanco made aware of troponin of 0.11. No new orders at this time.

## 2016-06-14 NOTE — H&P (Signed)
Nicole Norman is an 70 y.o. female.   Chief Complaint: Shortness of breath HPI: The patient with past medical history of mitral regurgitation presents emergency department complaining of shortness of breath. She states she was in her usual state of health prior to going to bed when she became acutely short of breath. She denies coughing or recent illness. She also denies fevers, nausea or vomiting. She was initially found to be hypoxic below 88%. She was placed on BiPAP which improved her work of breathing. CT of the chest showed no pulmonary embolism but significant interstitial edema as well as small pleural effusions. Groundglass opacities also present which are consistent with pneumonia. The emergency department initiated ceftriaxone and azithromycin prior to calling the hospitalist service for admission.  Past Medical History:  Diagnosis Date  . Dysrhythmia    "Fast and skips beats"  Reports recent echo and stress with Dr Fletcher Anon - [facility]  . Migraine    1x/month - uses Aleve  . Shortness of breath dyspnea    1 flight stairs - winded  . Vertigo    no meds    Past Surgical History:  Procedure Laterality Date  . CESAREAN SECTION    . CHOLECYSTECTOMY    . COLONOSCOPY N/A 12/07/2014   Procedure: COLONOSCOPY;  Surgeon: Lucilla Lame, MD;  Location: [place];  Service: Gastroenterology;  Laterality: N/A;  . ESOPHAGOGASTRODUODENOSCOPY N/A 12/07/2014   Procedure: ESOPHAGOGASTRODUODENOSCOPY (EGD);  Surgeon: Lucilla Lame, MD;  Location: [place];  Service: Gastroenterology;  Laterality: N/A;  . TONSILLECTOMY      Family History  Problem Relation Age of Onset  . Stroke Father   . Alzheimer's disease Mother   . Cancer Brother   . Cancer Brother   . Cancer Sister     breast  . Breast cancer Sister 58  . Cancer Sister     breast   Social History:  reports that she has been smoking Cigarettes.  She has a 22.50 pack-year smoking history. She has never used smokeless  tobacco. She reports that she does not drink alcohol or use drugs.  Allergies: No Known Allergies  Medications Prior to Admission  Medication Sig Dispense Refill  . aspirin 81 MG tablet Take 81 mg by mouth daily. AM    . Calcium Carbonate-Vitamin D (CALCIUM-VITAMIN D) 500-200 MG-UNIT per tablet Take 1 tablet by mouth daily. 10 AM    . Cholecalciferol (VITAMIN D3) 2000 UNITS TABS Take 1 tablet by mouth daily. 10 AM    . FIBER SELECT GUMMIES PO Take by mouth. 07:30 AM    . Inulin (FIBER CHOICE PO) Take by mouth daily. 07:30 AM    . metoprolol tartrate (LOPRESSOR) 25 MG tablet TAKE ONE TABLET BY MOUTH TWICE DAILY 180 tablet 3  . polyethylene glycol (MIRALAX / GLYCOLAX) packet Take 17 g by mouth daily.      Results for orders placed or performed during the hospital encounter of 06/13/16 (from the past 48 hour(s))  Basic metabolic panel     Status: Abnormal   Collection Time: 06/13/16 11:00 PM  Result Value Ref Range   Sodium 138 135 - 145 mmol/L   Potassium 3.6 3.5 - 5.1 mmol/L   Chloride 106 101 - 111 mmol/L   CO2 22 22 - 32 mmol/L   Glucose, Bld 208 (H) 65 - 99 mg/dL   BUN 11 6 - 20 mg/dL   Creatinine, Ser 0.78 0.44 - 1.00 mg/dL   Calcium 8.8 (L) 8.9 - 10.3  mg/dL   GFR calc non Af Amer >60 >60 mL/min   GFR calc Af Amer >60 >60 mL/min    Comment: (NOTE) The eGFR has been calculated using the CKD EPI equation. This calculation has not been validated in all clinical situations. eGFR's persistently <60 mL/min signify possible Chronic Kidney Disease.    Anion gap 10 5 - 15  CBC     Status: Abnormal   Collection Time: 06/13/16 11:00 PM  Result Value Ref Range   WBC 20.4 (H) 3.6 - 11.0 K/uL   RBC 4.30 3.80 - 5.20 MIL/uL   Hemoglobin 13.2 12.0 - 16.0 g/dL   HCT 39.8 35.0 - 47.0 %   MCV 92.6 80.0 - 100.0 fL   MCH 30.6 26.0 - 34.0 pg   MCHC 33.0 32.0 - 36.0 g/dL   RDW 13.3 11.5 - 14.5 %   Platelets 196 150 - 440 K/uL  Troponin I     Status: Abnormal   Collection Time: 06/13/16  11:00 PM  Result Value Ref Range   Troponin I 0.05 (HH) <0.03 ng/mL    Comment: CRITICAL RESULT CALLED TO, READ BACK BY AND VERIFIED WITH NELLIE MONAR AT 2336 ON 06/13/16 BY [facility]   Brain natriuretic peptide     Status: Abnormal   Collection Time: 06/13/16 11:00 PM  Result Value Ref Range   B Natriuretic Peptide 378.0 (H) 0.0 - 100.0 pg/mL  Fibrin derivatives D-Dimer     Status: Abnormal   Collection Time: 06/13/16 11:00 PM  Result Value Ref Range   Fibrin derivatives D-dimer ([facility]) 987 (H) 0 - 499    Comment: <> Exclusion of Venous Thromboembolism (VTE) - OUTPATIENTS ONLY        (Emergency Department or [place])             0-499 ng/ml (FEU)  : With a low to intermediate pretest                                        probability for VTE this test result                                        excludes the diagnosis of VTE.           > 499 ng/ml (FEU)  : VTE not excluded.  Additional work up                                   for VTE is required.   <>  Testing on Inpatients and Evaluation of Disseminated Intravascular        Coagulation (DIC)             Reference Range:   0-499 ng/ml (FEU)   Lactic acid, plasma     Status: Abnormal   Collection Time: 06/13/16 11:45 PM  Result Value Ref Range   Lactic Acid, Venous 3.0 (HH) 0.5 - 1.9 mmol/L    Comment: CRITICAL RESULT CALLED TO, READ BACK BY AND VERIFIED WITH NELLIE MONAR AT 0012 06/14/16.PMH  MRSA PCR Screening     Status: None   Collection Time: 06/14/16  3:31 AM  Result Value Ref Range   MRSA by PCR NEGATIVE NEGATIVE    Comment:  The GeneXpert MRSA Assay (FDA approved for NASAL specimens only), is one component of a comprehensive MRSA colonization surveillance program. It is not intended to diagnose MRSA infection nor to guide or monitor treatment for MRSA infections.   TSH     Status: None   Collection Time: 06/14/16  4:15 AM  Result Value Ref Range   TSH 1.287 0.350 - 4.500 uIU/mL    Comment: Performed by a  3rd Generation assay with a functional sensitivity of <=0.01 uIU/mL.  Troponin I     Status: Abnormal   Collection Time: 06/14/16  4:15 AM  Result Value Ref Range   Troponin I 0.11 (HH) <0.03 ng/mL    Comment: CRITICAL VALUE NOTED. VALUE IS CONSISTENT WITH PREVIOUSLY REPORTED/CALLED VALUE [facility]   Ct Angio Chest Pe W And/or Wo Contrast  Result Date: 06/14/2016 CLINICAL DATA:  Respiratory distress.  D-dimer 987 EXAM: CT ANGIOGRAPHY CHEST WITH CONTRAST TECHNIQUE: Multidetector CT imaging of the chest was performed using the standard protocol during bolus administration of intravenous contrast. Multiplanar CT image reconstructions and MIPs were obtained to evaluate the vascular anatomy. CONTRAST:  75 mL Isovue 370 COMPARISON:  11/02/2007 FINDINGS: Cardiovascular: Satisfactory opacification of the pulmonary arteries to the segmental level. No evidence of pulmonary embolism. Normal heart size. No pericardial effusion. Aortic atherosclerosis. Mediastinum/Nodes: No enlarged mediastinal, hilar, or axillary lymph nodes. Thyroid gland, trachea, and esophagus demonstrate no significant findings. Lungs/Pleura: Small bilateral pleural effusions with basilar atelectasis. Interstitial prominence in the lung bases and lung periphery consistent with interstitial edema. Multiple focal nodular ground-glass infiltrates demonstrated in both lungs, likely representing inflammatory process. Largest is in the right upper lung and measures about 12 mm diameter. Airways are patent. No pneumothorax. Upper Abdomen: Small cyst in the lateral segment left lobe of the liver are unchanged since previous study. Surgical absence of the gallbladder. Musculoskeletal: No chest wall abnormality. No acute or significant osseous findings. Review of the MIP images confirms the above findings. IMPRESSION: No evidence of significant pulmonary embolus. Small bilateral pleural effusions with basilar atelectasis and diffuse bilateral peripheral and  basilar interstitial edema. **An incidental finding of potential clinical significance has been found. Multiple focal nodular ground-glass opacities in both lungs are nonspecific though probably represent inflammatory or infectious process. Non-contrast chest CT at 3-6 months is recommended. If nodules persist, subsequent management will be based upon the most suspicious nodule(s). This recommendation follows the consensus statement: Guidelines for Management of Incidental Pulmonary Nodules Detected on CT Images: From the Fleischner Society 2017; Radiology 2017; 284:228-243. ** Electronically Signed   By: Lucienne Capers M.D.   On: 06/14/2016 01:07   Dg Chest Port 1 View  Result Date: 06/13/2016 CLINICAL DATA:  70 y/o F; respiratory distress and feeling she could not breathe. EXAM: PORTABLE CHEST 1 VIEW COMPARISON:  07/15/2013 chest radiograph. FINDINGS: The stable cardiomediastinal silhouette within normal limits given projection and technique. Increased interstitial markings probably represents interstitial edema. Ill-defined opacity right lung base may represent atelectasis or pneumonia. No acute osseous abnormality is evident. IMPRESSION: Stable cardiac silhouette. Interstitial edema. Ill-defined right basilar opacity may represent atelectasis or pneumonia. Electronically Signed   By: Kristine Garbe M.D.   On: 06/13/2016 23:30    Review of Systems  Constitutional: Negative for chills and fever.  HENT: Negative for sore throat and tinnitus.   Eyes: Negative for blurred vision and redness.  Respiratory: Positive for shortness of breath. Negative for cough.   Cardiovascular: Negative for chest pain, palpitations, orthopnea and PND.  Gastrointestinal:  Negative for abdominal pain, diarrhea, nausea and vomiting.  Genitourinary: Negative for dysuria, frequency and urgency.  Musculoskeletal: Negative for joint pain and myalgias.  Skin: Negative for rash.       No lesions  Neurological:  Negative for speech change, focal weakness and weakness.  Endo/Heme/Allergies: Does not bruise/bleed easily.       No temperature intolerance  Psychiatric/Behavioral: Negative for depression and suicidal ideas.    Blood pressure (!) 98/58, pulse 88, temperature 97.7 F (36.5 C), temperature source Axillary, resp. rate (!) 21, height 4' 11"  (1.499 m), weight 57.7 kg (127 lb 3.3 oz), SpO2 100 %. Physical Exam  Vitals reviewed. Constitutional: She is oriented to person, place, and time. She appears well-developed and well-nourished. No distress.  HENT:  Head: Normocephalic and atraumatic.  Mouth/Throat: Oropharynx is clear and moist.  Eyes: Conjunctivae and EOM are normal. Pupils are equal, round, and reactive to light. No scleral icterus.  Neck: Normal range of motion. No tracheal deviation present. No thyromegaly present.  Cardiovascular: Normal rate, regular rhythm and normal heart sounds.  Exam reveals no gallop and no friction rub.   No murmur heard. Respiratory: Tachypnea noted. She has rales (faint) in the right lower field.  BiPAP in place  GI: Soft. Bowel sounds are normal. She exhibits no distension. There is no tenderness.  Genitourinary:  Genitourinary Comments: Deferred  Musculoskeletal: Normal range of motion. She exhibits no edema.  Lymphadenopathy:    She has no cervical adenopathy.  Neurological: She is alert and oriented to person, place, and time. No cranial nerve deficit. She exhibits normal muscle tone.  Skin: Skin is warm and dry. No rash noted. No erythema.  Psychiatric: She has a normal mood and affect. Her behavior is normal. Judgment and thought content normal.     Assessment/Plan This is a 70 year old female admitted for acute respiratory failure. 1. Acute respiratory failure: With hypoxia; secondary to multilobar pneumonia as well as interstitial edema. Continue antibiotics. BiPAP as needed for work of breathing. Wean oxygen as tolerated. Currently the  patient requires 35% FiO2. 2. Sepsis: The patient criteria via tachypnea and leukocytosis. Blood cultures obtained. Follow for growth and sensitivities. The patient is hemodynamically stable. 3. Elevated troponin: Secondary to demand ischemia. Continue to follow. Monitor telemetry. Cardiology consultation at discretion of the primary team.  4. Community-acquired pneumonia: Leukocytosis and hypoxia; continue azithromycin and ceftriaxone 5. Mitral regurg: Secondary to posterior leaflet prolapse. The patient had an echocardiogram last week which showed preserved ejection fraction and no diastolic dysfunction. Given her faint rales on physical exam as well as interstitial edema possibility exists that her pneumonia can be compounded by some acute heart failure. Consider repeat echocardiogram if difficult to wean from BiPAP. 6. DVT prophylaxis: Lovenox 7. GI prophylaxis: None The patient is a full code. Time spent on admission orders and critical care approximately 45 minutes  Harrie Foreman, MD 06/14/2016, 7:38 AM

## 2016-06-14 NOTE — ED Notes (Signed)
Patient transported to CT 

## 2016-06-14 NOTE — Progress Notes (Signed)
Patient ID: Nicole Norman, female   DOB: 1946-03-29, 70 y.o.   MRN: [id number]  [facility] PROGRESS NOTE  ABBIEGAIL HAMPEL [id number] DOB: [birth year] DOA: 06/13/2016 PCP: Tracie Harrier, MD  HPI/Subjective: Patient feeling much better than when she came in. Last few days her shortness of breath has been worse. But she did have an episode about a week ago where she had shortness of breath. Some cough. Some wheeze prior to coming in.  Objective: Vitals:   06/14/16 1400 06/14/16 1409  BP: (!) 87/50 (!) 99/58  Pulse: 98 97  Resp: (!) 22 (!) 26  Temp:      Filed Weights   06/13/16 2256 06/14/16 0338  Weight: 56.7 kg (125 lb) 57.7 kg (127 lb 3.3 oz)    ROS: Review of Systems  Constitutional: Negative for chills and fever.  Eyes: Negative for blurred vision.  Respiratory: Positive for cough, shortness of breath and wheezing.   Cardiovascular: Negative for chest pain.  Gastrointestinal: Negative for abdominal pain, constipation, diarrhea, nausea and vomiting.  Genitourinary: Negative for dysuria.  Musculoskeletal: Negative for joint pain.  Neurological: Negative for dizziness and headaches.   Exam: Physical Exam  Constitutional: She is oriented to person, place, and time.  HENT:  Nose: No mucosal edema.  Mouth/Throat: No oropharyngeal exudate or posterior oropharyngeal edema.  Eyes: Conjunctivae, EOM and lids are normal. Pupils are equal, round, and reactive to light.  Neck: No JVD present. Carotid bruit is not present. No edema present. No thyroid mass and no thyromegaly present.  Cardiovascular: S1 normal and S2 normal.  Exam reveals no gallop.   No murmur heard. Pulses:      Dorsalis pedis pulses are 2+ on the right side, and 2+ on the left side.  Respiratory: No respiratory distress. She has decreased breath sounds in the right middle field, the right lower field, the left middle field and the left lower field. She has no wheezes. She has no rhonchi. She  has no rales.  GI: Soft. Bowel sounds are normal. There is no tenderness.  Musculoskeletal:       Right ankle: She exhibits no swelling.       Left ankle: She exhibits no swelling.  Lymphadenopathy:    She has no cervical adenopathy.  Neurological: She is alert and oriented to person, place, and time. No cranial nerve deficit.  Skin: Skin is warm. No rash noted. Nails show no clubbing.  Psychiatric: She has a normal mood and affect.      Data Reviewed: Basic Metabolic Panel:  Recent Labs Lab 06/13/16 2300  NA 138  K 3.6  CL 106  CO2 22  GLUCOSE 208*  BUN 11  CREATININE 0.78  CALCIUM 8.8*   CBC:  Recent Labs Lab 06/13/16 2300  WBC 20.4*  HGB 13.2  HCT 39.8  MCV 92.6  PLT 196   Cardiac Enzymes:  Recent Labs Lab 06/13/16 2300 06/14/16 0415 06/14/16 0913  TROPONINI 0.05* 0.11* 0.09*   BNP (last 3 results)  Recent Labs  06/13/16 2300  BNP 378.0*      Recent Results (from the past 240 hour(s))  Blood Culture (routine x 2)     Status: None (Preliminary result)   Collection Time: 06/13/16 11:42 PM  Result Value Ref Range Status   Specimen Description BLOOD LEFT HAND  Final   Special Requests BOTTLES DRAWN AEROBIC AND ANAEROBIC 3CC  Final   Culture NO GROWTH < 12 HOURS  Final   Report Status PENDING  Incomplete  Blood Culture (routine x 2)     Status: None (Preliminary result)   Collection Time: 06/13/16 11:42 PM  Result Value Ref Range Status   Specimen Description BLOOD LEFT FOREARM  Final   Special Requests BOTTLES DRAWN AEROBIC AND ANAEROBIC 3CC  Final   Culture NO GROWTH < 12 HOURS  Final   Report Status PENDING  Incomplete  MRSA PCR Screening     Status: None   Collection Time: 06/14/16  3:31 AM  Result Value Ref Range Status   MRSA by PCR NEGATIVE NEGATIVE Final    Comment:        The GeneXpert MRSA Assay (FDA approved for NASAL specimens only), is one component of a comprehensive MRSA colonization surveillance program. It is  not intended to diagnose MRSA infection nor to guide or monitor treatment for MRSA infections.      Studies: Ct Angio Chest Pe W And/or Wo Contrast  Result Date: 06/14/2016 CLINICAL DATA:  Respiratory distress.  D-dimer 987 EXAM: CT ANGIOGRAPHY CHEST WITH CONTRAST TECHNIQUE: Multidetector CT imaging of the chest was performed using the standard protocol during bolus administration of intravenous contrast. Multiplanar CT image reconstructions and MIPs were obtained to evaluate the vascular anatomy. CONTRAST:  75 mL Isovue 370 COMPARISON:  11/02/2007 FINDINGS: Cardiovascular: Satisfactory opacification of the pulmonary arteries to the segmental level. No evidence of pulmonary embolism. Normal heart size. No pericardial effusion. Aortic atherosclerosis. Mediastinum/Nodes: No enlarged mediastinal, hilar, or axillary lymph nodes. Thyroid gland, trachea, and esophagus demonstrate no significant findings. Lungs/Pleura: Small bilateral pleural effusions with basilar atelectasis. Interstitial prominence in the lung bases and lung periphery consistent with interstitial edema. Multiple focal nodular ground-glass infiltrates demonstrated in both lungs, likely representing inflammatory process. Largest is in the right upper lung and measures about 12 mm diameter. Airways are patent. No pneumothorax. Upper Abdomen: Small cyst in the lateral segment left lobe of the liver are unchanged since previous study. Surgical absence of the gallbladder. Musculoskeletal: No chest wall abnormality. No acute or significant osseous findings. Review of the MIP images confirms the above findings. IMPRESSION: No evidence of significant pulmonary embolus. Small bilateral pleural effusions with basilar atelectasis and diffuse bilateral peripheral and basilar interstitial edema. **An incidental finding of potential clinical significance has been found. Multiple focal nodular ground-glass opacities in both lungs are nonspecific though  probably represent inflammatory or infectious process. Non-contrast chest CT at 3-6 months is recommended. If nodules persist, subsequent management will be based upon the most suspicious nodule(s). This recommendation follows the consensus statement: Guidelines for Management of Incidental Pulmonary Nodules Detected on CT Images: From the Fleischner Society 2017; Radiology 2017; 284:228-243. ** Electronically Signed   By: Lucienne Capers M.D.   On: 06/14/2016 01:07   Dg Chest Port 1 View  Result Date: 06/13/2016 CLINICAL DATA:  70 y/o F; respiratory distress and feeling she could not breathe. EXAM: PORTABLE CHEST 1 VIEW COMPARISON:  07/15/2013 chest radiograph. FINDINGS: The stable cardiomediastinal silhouette within normal limits given projection and technique. Increased interstitial markings probably represents interstitial edema. Ill-defined opacity right lung base may represent atelectasis or pneumonia. No acute osseous abnormality is evident. IMPRESSION: Stable cardiac silhouette. Interstitial edema. Ill-defined right basilar opacity may represent atelectasis or pneumonia. Electronically Signed   By: Kristine Garbe M.D.   On: 06/13/2016 23:30    Scheduled Meds: . aspirin EC  81 mg Oral Daily  . azithromycin  500 mg Intravenous Daily  . calcium-vitamin D  1 tablet Oral Daily  .  cefTRIAXone  1 g Intravenous Daily  . chlorhexidine  15 mL Mouth Rinse BID  . cholecalciferol  2,000 Units Oral Daily  . docusate sodium  100 mg Oral BID  . enoxaparin (LOVENOX) injection  40 mg Subcutaneous Q24H  . mouth rinse  15 mL Mouth Rinse q12n4p  . [START ON 06/15/2016] pneumococcal 23 valent vaccine  0.5 mL Intramuscular Tomorrow-1000  . predniSONE  30 mg Oral Q breakfast  . sodium chloride flush  3 mL Intravenous Q12H    Assessment/Plan:  1. Acute hypoxic respiratory failure requiring BiPAP on presentation. No breathing better on room air. 2. COPD exacerbation. Patient was given Solu-Medrol  by EMS. I will continue prednisone taper. Start nebulizer treatments. 3. Bilateral pneumonia with groundglass opacities. Rocephin and Zithromax prescribed. 4. Tobacco abuse smoking cessation counseling done 3 minutes by me. Refuses nicotine patch. 5. History of arrhythmia. Telemetry monitoring. Hold metoprolol with relative hypotension 6. Relative hypotension. Fluid bolus. Hold metoprolol.  Code Status:     Code Status Orders        Start     Ordered   06/14/16 0330  Full code  Continuous     06/14/16 0329    Code Status History    Date Active Date Inactive Code Status Order ID Comments User Context   This patient has a current code status but no historical code status.    Advance Directive Documentation   [place] Most Recent Value  Type of Advance Directive  Living will  Pre-existing out of facility DNR order (yellow form or pink MOST form)  No data  "MOST" Form in Place?  No data     Family Communication: Husband at the bedside Disposition Plan: Potentially home tomorrow next day depending on respiratory status  Antibiotics:  Rocephin  Zithromax  Time spent: 35 minutes  [place], [place]

## 2016-06-15 ENCOUNTER — Inpatient Hospital Stay: Payer: PPO

## 2016-06-15 DIAGNOSIS — R0602 Shortness of breath: Secondary | ICD-10-CM

## 2016-06-15 DIAGNOSIS — R918 Other nonspecific abnormal finding of lung field: Secondary | ICD-10-CM

## 2016-06-15 DIAGNOSIS — I493 Ventricular premature depolarization: Secondary | ICD-10-CM

## 2016-06-15 DIAGNOSIS — I700 Atherosclerosis of aorta: Secondary | ICD-10-CM

## 2016-06-15 DIAGNOSIS — I2510 Atherosclerotic heart disease of native coronary artery without angina pectoris: Secondary | ICD-10-CM

## 2016-06-15 DIAGNOSIS — J9601 Acute respiratory failure with hypoxia: Secondary | ICD-10-CM

## 2016-06-15 DIAGNOSIS — F17200 Nicotine dependence, unspecified, uncomplicated: Secondary | ICD-10-CM

## 2016-06-15 DIAGNOSIS — J181 Lobar pneumonia, unspecified organism: Secondary | ICD-10-CM

## 2016-06-15 DIAGNOSIS — I471 Supraventricular tachycardia: Secondary | ICD-10-CM

## 2016-06-15 LAB — CBC
HCT: 35.6 % (ref 35.0–47.0)
Hemoglobin: 11.7 g/dL — ABNORMAL LOW (ref 12.0–16.0)
MCH: 30.5 pg (ref 26.0–34.0)
MCHC: 32.8 g/dL (ref 32.0–36.0)
MCV: 93 fL (ref 80.0–100.0)
PLATELETS: 208 10*3/uL (ref 150–440)
RBC: 3.83 MIL/uL (ref 3.80–5.20)
RDW: 13.2 % (ref 11.5–14.5)
WBC: 23.4 10*3/uL — AB (ref 3.6–11.0)

## 2016-06-15 LAB — BASIC METABOLIC PANEL
ANION GAP: 7 (ref 5–15)
BUN: 14 mg/dL (ref 6–20)
CO2: 24 mmol/L (ref 22–32)
Calcium: 8.7 mg/dL — ABNORMAL LOW (ref 8.9–10.3)
Chloride: 111 mmol/L (ref 101–111)
Creatinine, Ser: 0.79 mg/dL (ref 0.44–1.00)
GFR calc Af Amer: >60 mL/min (ref >60)
Glucose, Bld: 107 mg/dL — ABNORMAL HIGH (ref 65–99)
POTASSIUM: 3.8 mmol/L (ref 3.5–5.1)
SODIUM: 142 mmol/L (ref 135–145)

## 2016-06-15 LAB — INFLUENZA PANEL BY PCR (TYPE A & B)
Influenza A By PCR: NEGATIVE
Influenza B By PCR: NEGATIVE

## 2016-06-15 LAB — HEMOGLOBIN A1C
Hgb A1c MFr Bld: 5.4 % (ref 4.8–5.6)
Mean Plasma Glucose: 108 mg/dL

## 2016-06-15 MED ORDER — AMIODARONE HCL IN DEXTROSE 360-4.14 MG/200ML-% IV SOLN
30.0000 mg/h | INTRAVENOUS | Status: DC
  Administered 2016-06-15 – 2016-06-16 (×3): 30 mg/h via INTRAVENOUS
  Filled 2016-06-15 (×2): qty 200

## 2016-06-15 MED ORDER — AMIODARONE LOAD VIA INFUSION
150.0000 mg | Freq: Once | INTRAVENOUS | Status: AC
  Administered 2016-06-15: 150 mg via INTRAVENOUS
  Filled 2016-06-15: qty 83.34

## 2016-06-15 MED ORDER — AMIODARONE HCL IN DEXTROSE 360-4.14 MG/200ML-% IV SOLN
60.0000 mg/h | INTRAVENOUS | Status: AC
  Administered 2016-06-15 (×2): 60 mg/h via INTRAVENOUS
  Filled 2016-06-15 (×2): qty 200

## 2016-06-15 MED ORDER — BUDESONIDE 0.5 MG/2ML IN SUSP
0.5000 mg | Freq: Two times a day (BID) | RESPIRATORY_TRACT | Status: DC
  Administered 2016-06-15 – 2016-06-17 (×5): 0.5 mg via RESPIRATORY_TRACT
  Filled 2016-06-15 (×6): qty 2

## 2016-06-15 MED ORDER — ADENOSINE 6 MG/2ML IV SOLN
INTRAVENOUS | Status: AC
  Administered 2016-06-15: 10:00:00
  Filled 2016-06-15: qty 4

## 2016-06-15 MED ORDER — MORPHINE SULFATE (PF) 4 MG/ML IV SOLN: 1.0000 mg | INTRAVENOUS | Status: DC | PRN

## 2016-06-15 MED ORDER — DILTIAZEM HCL ER COATED BEADS 240 MG PO CP24
240.0000 mg | ORAL_CAPSULE | Freq: Every day | ORAL | Status: DC
  Administered 2016-06-15 – 2016-06-17 (×3): 240 mg via ORAL
  Filled 2016-06-15 (×3): qty 1

## 2016-06-15 NOTE — Progress Notes (Addendum)
Amiodarone infusing, VSS, pt resting quietly, reports she is much more comfortable now.  Sinus rythym on the monitor, BP has improved to 108/61, MAP 76  Experiences DOE, even getting on and off bedpan.  [place] O2 increased to 3 liters to assist recovery.  HR elevates mildly with minimal exertion to very low 100s

## 2016-06-15 NOTE — Progress Notes (Signed)
Patient ID: Nicole Norman, female   DOB: 03-Jul-1946, 70 y.o.   MRN: [id number]   [facility] PROGRESS NOTE  Nicole Norman [id number] DOB: 1945-10-24 DOA: 06/13/2016 PCP: Tracie Harrier, MD  HPI/Subjective: Patient had palpitation and shortness of breath and chest discomfort when she went into SVT this morning. Also had an episode of SVT last night. Patient feels a little bit better than when she came in but still short of breath.  Objective: Vitals:   06/15/16 1200 06/15/16 1300  BP: (!) 103/56 (!) 114/59  Pulse: 94 (!) 103  Resp: (!) 27 (!) 31  Temp: 98 F (36.7 C)     Filed Weights   06/13/16 2256 06/14/16 0338  Weight: 56.7 kg (125 lb) 57.7 kg (127 lb 3.3 oz)    ROS: Review of Systems  Constitutional: Negative for chills and fever.  Eyes: Negative for blurred vision.  Respiratory: Positive for cough, shortness of breath and wheezing.   Cardiovascular: Positive for chest pain and palpitations.  Gastrointestinal: Negative for abdominal pain, constipation, diarrhea, nausea and vomiting.  Genitourinary: Negative for dysuria.  Musculoskeletal: Negative for joint pain.  Neurological: Negative for dizziness and headaches.   Exam: Physical Exam  Constitutional: She is oriented to person, place, and time.  HENT:  Nose: No mucosal edema.  Mouth/Throat: No oropharyngeal exudate or posterior oropharyngeal edema.  Eyes: Conjunctivae, EOM and lids are normal. Pupils are equal, round, and reactive to light.  Neck: No JVD present. Carotid bruit is not present. No edema present. No thyroid mass and no thyromegaly present.  Cardiovascular: S1 normal and S2 normal.  Tachycardia present.  Exam reveals no gallop.   No murmur heard. Pulses:      Dorsalis pedis pulses are 2+ on the right side, and 2+ on the left side.  Respiratory: No respiratory distress. She has decreased breath sounds in the right middle field, the right lower field, the left middle field and the  left lower field. She has no wheezes. She has no rhonchi. She has no rales.  GI: Soft. Bowel sounds are normal. There is no tenderness.  Musculoskeletal:       Right ankle: She exhibits no swelling.       Left ankle: She exhibits no swelling.  Lymphadenopathy:    She has no cervical adenopathy.  Neurological: She is alert and oriented to person, place, and time. No cranial nerve deficit.  Skin: Skin is warm. No rash noted. Nails show no clubbing.  Psychiatric: She has a normal mood and affect.      Data Reviewed: Basic Metabolic Panel:  Recent Labs Lab 06/13/16 2300 06/15/16 0815  NA 138 142  K 3.6 3.8  CL 106 111  CO2 22 24  GLUCOSE 208* 107*  BUN 11 14  CREATININE 0.78 0.79  CALCIUM 8.8* 8.7*   CBC:  Recent Labs Lab 06/13/16 2300 06/15/16 0815  WBC 20.4* 23.4*  HGB 13.2 11.7*  HCT 39.8 35.6  MCV 92.6 93.0  PLT 196 208   Cardiac Enzymes:  Recent Labs Lab 06/13/16 2300 06/14/16 0415 06/14/16 0913 06/14/16 1532  TROPONINI 0.05* 0.11* 0.09* 0.08*   BNP (last 3 results)  Recent Labs  06/13/16 2300  BNP 378.0*      Recent Results (from the past 240 hour(s))  Blood Culture (routine x 2)     Status: None (Preliminary result)   Collection Time: 06/13/16 11:42 PM  Result Value Ref Range Status   Specimen Description BLOOD LEFT HAND  Final  Special Requests BOTTLES DRAWN AEROBIC AND ANAEROBIC 3CC  Final   Culture NO GROWTH 2 DAYS  Final   Report Status PENDING  Incomplete  Blood Culture (routine x 2)     Status: None (Preliminary result)   Collection Time: 06/13/16 11:42 PM  Result Value Ref Range Status   Specimen Description BLOOD LEFT FOREARM  Final   Special Requests BOTTLES DRAWN AEROBIC AND ANAEROBIC 3CC  Final   Culture NO GROWTH 2 DAYS  Final   Report Status PENDING  Incomplete  MRSA PCR Screening     Status: None   Collection Time: 06/14/16  3:31 AM  Result Value Ref Range Status   MRSA by PCR NEGATIVE NEGATIVE Final    Comment:         The GeneXpert MRSA Assay (FDA approved for NASAL specimens only), is one component of a comprehensive MRSA colonization surveillance program. It is not intended to diagnose MRSA infection nor to guide or monitor treatment for MRSA infections.      Studies: Dg Chest 1 View  Result Date: 06/15/2016 CLINICAL DATA:  Dyspnea.  History of dysrhythmia. EXAM: CHEST 1 VIEW COMPARISON:  06/13/2016 FINDINGS: Normal heart size. Small right pleural effusion. Increased from previous exam. Mild diffuse pulmonary edema, increased from previous exam. IMPRESSION: Increase in CHF. Electronically Signed   By: Kerby Moors M.D.   On: 06/15/2016 10:49   Ct Angio Chest Pe W And/or Wo Contrast  Result Date: 06/14/2016 CLINICAL DATA:  Respiratory distress.  D-dimer 987 EXAM: CT ANGIOGRAPHY CHEST WITH CONTRAST TECHNIQUE: Multidetector CT imaging of the chest was performed using the standard protocol during bolus administration of intravenous contrast. Multiplanar CT image reconstructions and MIPs were obtained to evaluate the vascular anatomy. CONTRAST:  75 mL Isovue 370 COMPARISON:  11/02/2007 FINDINGS: Cardiovascular: Satisfactory opacification of the pulmonary arteries to the segmental level. No evidence of pulmonary embolism. Normal heart size. No pericardial effusion. Aortic atherosclerosis. Mediastinum/Nodes: No enlarged mediastinal, hilar, or axillary lymph nodes. Thyroid gland, trachea, and esophagus demonstrate no significant findings. Lungs/Pleura: Small bilateral pleural effusions with basilar atelectasis. Interstitial prominence in the lung bases and lung periphery consistent with interstitial edema. Multiple focal nodular ground-glass infiltrates demonstrated in both lungs, likely representing inflammatory process. Largest is in the right upper lung and measures about 12 mm diameter. Airways are patent. No pneumothorax. Upper Abdomen: Small cyst in the lateral segment left lobe of the liver are unchanged  since previous study. Surgical absence of the gallbladder. Musculoskeletal: No chest wall abnormality. No acute or significant osseous findings. Review of the MIP images confirms the above findings. IMPRESSION: No evidence of significant pulmonary embolus. Small bilateral pleural effusions with basilar atelectasis and diffuse bilateral peripheral and basilar interstitial edema. **An incidental finding of potential clinical significance has been found. Multiple focal nodular ground-glass opacities in both lungs are nonspecific though probably represent inflammatory or infectious process. Non-contrast chest CT at 3-6 months is recommended. If nodules persist, subsequent management will be based upon the most suspicious nodule(s). This recommendation follows the consensus statement: Guidelines for Management of Incidental Pulmonary Nodules Detected on CT Images: From the Fleischner Society 2017; Radiology 2017; 284:228-243. ** Electronically Signed   By: Lucienne Capers M.D.   On: 06/14/2016 01:07   Dg Chest Port 1 View  Result Date: 06/13/2016 CLINICAL DATA:  70 y/o F; respiratory distress and feeling she could not breathe. EXAM: PORTABLE CHEST 1 VIEW COMPARISON:  07/15/2013 chest radiograph. FINDINGS: The stable cardiomediastinal silhouette within normal limits given  projection and technique. Increased interstitial markings probably represents interstitial edema. Ill-defined opacity right lung base may represent atelectasis or pneumonia. No acute osseous abnormality is evident. IMPRESSION: Stable cardiac silhouette. Interstitial edema. Ill-defined right basilar opacity may represent atelectasis or pneumonia. Electronically Signed   By: Kristine Garbe M.D.   On: 06/13/2016 23:30    Scheduled Meds: . aspirin EC  81 mg Oral Daily  . azithromycin  500 mg Intravenous Daily  . budesonide (PULMICORT) nebulizer solution  0.5 mg Nebulization BID  . calcium-vitamin D  1 tablet Oral Daily  . cefTRIAXone   1 g Intravenous Daily  . cholecalciferol  2,000 Units Oral Daily  . diltiazem  240 mg Oral Daily  . docusate sodium  100 mg Oral BID  . enoxaparin (LOVENOX) injection  40 mg Subcutaneous Q24H  . ipratropium-albuterol  3 mL Nebulization Q6H  . predniSONE  30 mg Oral Q breakfast  . sodium chloride flush  3 mL Intravenous Q12H    Assessment/Plan:  1. Acute hypoxic respiratory failure requiring BiPAP on presentation. Patient now on 3 L nasal cannula 2. SVT. I tried Cardizem orally last night and again this morning. A shunt still had episode of SVT requiring amiodarone drip. Cardiology now following. May need ablation as outpatient. 3. COPD exacerbation. Patient was given Solu-Medrol by EMS. I will continue prednisone taper. Continue nebulizer treatments. Budesonide nebulizers added. 4. Bilateral pneumonia with groundglass opacities. Rocephin and Zithromax prescribed. 5. Tobacco abuse. Refuses nicotine patch. 6. Relative hypotension. Continue to monitor closely  Code Status:     Code Status Orders        Start     Ordered   06/14/16 0330  Full code  Continuous     06/14/16 0329    Code Status History    Date Active Date Inactive Code Status Order ID Comments User Context   This patient has a current code status but no historical code status.    Advance Directive Documentation   [place] Most Recent Value  Type of Advance Directive  Living will  Pre-existing out of facility DNR order (yellow form or pink MOST form)  No data  "MOST" Form in Place?  No data     Family Communication: Husband at the bedside Disposition Plan: Need to watch respiratory status and heart rate prior to making a disposition home  Antibiotics:  Rocephin  Zithromax  Time spent: 25 minutes  , [place]

## 2016-06-15 NOTE — Plan of Care (Signed)
Problem: ICU Phase Progression Outcomes Goal: Hemodynamically stable Outcome: Not Progressing Pressures remain 'soft' w/ SBP<100.

## 2016-06-15 NOTE — Consult Note (Signed)
Cardiology Consultation Note  Patient ID: Nicole Norman, MRN: [id number], DOB/AGE: August 25, 1945 70 y.o. Admit date: 06/13/2016   Date of Consult: 06/15/2016 Primary Physician: Tracie Harrier, MD Primary Cardiologist: Kathlyn [place]  Chief Complaint: Shortness of breath Reason for Consult: Runs of SVT Physician requesting consult:    HPI: 71 y.o. female with  history of PVCs and mitral regurgitation, Long smoking history, continues to smoke 1/2 pack per day She was in usual state of health until last night when she became acutely short of breath, On arrival to the emergency room was hypoxic with saturation 88%, placed on BiPAP  On further discussion she reports several days of shortness of breath, getting progressively worse Some cough, wheezing prior to arrival to the hospital  ICU flowsheet notes indicating low blood pressure 87 systolic  CT scan on arrival showed no PE , but she did have interstitial edema, small pleural effusion, round glass opacities concerning for pneumonia, antibiotics initiated  She has no history of diabetes, hypertension or hyperlipidemia.  She reports having at least 1-2 years of severe fluttering in her chest, episodes come on acutely, last for variable periods of time, symptoms typically resolve after fluttering ceases.  Previous Holter monitor in 2015 showed a total of 26,000 PVCs in 48 hours.   Nuclear stress test in 2015 showed normal LV systolic function with no evidence of ischemia.  Echocardiogram showed an ejection fraction of 60-65%, moderate posterior mitral valve prolapse with moderate to severe mitral regurgitation.   ------> at 10:00 she had SVT Rhythm up to 190 bpm, persisted She did not respond to Valsalva, carotid sinus massage At the bedside by requested we push adenosine 6 mg She converted relatively quickly to normal sinus rhythm Baseline rhythm is normal sinus with rare PVCs. Review of telemetry shows additional episodes  of SVT overnight, 5 PM yesterday. This morning's episode was the longest  She reports symptoms of fluttering when she had SVT were similar to what she has been experiencing over the past year    Past Medical History:  Diagnosis Date  . Dysrhythmia    "Fast and skips beats"  Reports recent echo and stress with Dr Fletcher Anon - [facility]  . Migraine    1x/month - uses Aleve  . Shortness of breath dyspnea    1 flight stairs - winded  . Vertigo    no meds      Most Recent Cardiac Studies: Echocardiogram 06/05/2016 with normal LV function greater than 60%, moderate prolapse posterior leaflet, moderate eccentric regurgitation, no change from echo from 2015    Surgical History:  Past Surgical History:  Procedure Laterality Date  . CESAREAN SECTION    . CHOLECYSTECTOMY    . COLONOSCOPY N/A 12/07/2014   Procedure: COLONOSCOPY;  Surgeon: Lucilla Lame, MD;  Location: [place];  Service: Gastroenterology;  Laterality: N/A;  . ESOPHAGOGASTRODUODENOSCOPY N/A 12/07/2014   Procedure: ESOPHAGOGASTRODUODENOSCOPY (EGD);  Surgeon: Lucilla Lame, MD;  Location: [place];  Service: Gastroenterology;  Laterality: N/A;  . TONSILLECTOMY       Home Meds: Prior to Admission medications   Medication Sig Start Date End Date Taking? Authorizing Provider  aspirin 81 MG tablet Take 81 mg by mouth daily. AM   Yes Historical Provider, MD  Calcium Carbonate-Vitamin D (CALCIUM-VITAMIN D) 500-200 MG-UNIT per tablet Take 1 tablet by mouth daily. 10 AM   Yes Historical Provider, MD  Cholecalciferol (VITAMIN D3) 2000 UNITS TABS Take 1 tablet by mouth daily. 10 AM   Yes  Historical Provider, MD  FIBER SELECT GUMMIES PO Take by mouth. 07:30 AM   Yes Historical Provider, MD  Inulin (FIBER CHOICE PO) Take by mouth daily. 07:30 AM   Yes Historical Provider, MD  metoprolol tartrate (LOPRESSOR) 25 MG tablet TAKE ONE TABLET BY MOUTH TWICE DAILY 01/03/16  Yes Wellington Hampshire, MD  polyethylene glycol (MIRALAX / GLYCOLAX)  packet Take 17 g by mouth daily.    Historical Provider, MD    Inpatient Medications:  . aspirin EC  81 mg Oral Daily  . azithromycin  500 mg Intravenous Daily  . budesonide (PULMICORT) nebulizer solution  0.25 mg Nebulization BID  . calcium-vitamin D  1 tablet Oral Daily  . cefTRIAXone  1 g Intravenous Daily  . cholecalciferol  2,000 Units Oral Daily  . diltiazem  240 mg Oral Daily  . docusate sodium  100 mg Oral BID  . enoxaparin (LOVENOX) injection  40 mg Subcutaneous Q24H  . ipratropium-albuterol  3 mL Nebulization Q6H  . predniSONE  30 mg Oral Q breakfast  . sodium chloride flush  3 mL Intravenous Q12H     Allergies: No Known Allergies  Social History   Social History  . Marital status: Married    Spouse name: N/A  . Number of children: N/A  . Years of education: N/A   Occupational History  . Not on file.   Social History Main Topics  . Smoking status: Current Every Day Smoker    Packs/day: 0.50    Years: 45.00    Types: Cigarettes  . Smokeless tobacco: Never Used  . Alcohol use No     Comment: occasional  . Drug use: No  . Sexual activity: Not on file   Other Topics Concern  . Not on file   Social History Narrative  . No narrative on file     Family History  Problem Relation Age of Onset  . Stroke Father   . Alzheimer's disease Mother   . Cancer Brother   . Cancer Brother   . Cancer Sister     breast  . Breast cancer Sister 66  . Cancer Sister     breast     Review of Systems: Review of Systems  Constitutional: Negative.   Respiratory: Positive for cough and shortness of breath.   Cardiovascular: Positive for palpitations.       Tachycardia  Gastrointestinal: Negative.   Musculoskeletal: Negative.   Neurological: Negative.   Psychiatric/Behavioral: Negative.   All other systems reviewed and are negative.  All other systems were reviewed and negative.   Labs: CBC  Recent Labs  06/13/16 2300 06/15/16 0815  WBC 20.4* 23.4*  HGB  13.2 11.7*  HCT 39.8 35.6  MCV 92.6 93.0  PLT 196 [id number]   Basic Metabolic Panel  Recent Labs  06/13/16 2300 06/15/16 0815  NA 138 142  K 3.6 3.8  CL 106 111  CO2 22 24  GLUCOSE 208* 107*  BUN 11 14  CREATININE 0.78 0.79  CALCIUM 8.8* 8.7*   Liver Function Tests No results for input(s): AST, ALT, ALKPHOS, BILITOT, PROT, ALBUMIN in the last 72 hours. No results for input(s): LIPASE, AMYLASE in the last 72 hours. Cardiac Enzymes  Recent Labs  06/14/16 0415 06/14/16 0913 06/14/16 1532  TROPONINI 0.11* 0.09* 0.08*   BNP Invalid input(s): POCBNP D-Dimer No results for input(s): DDIMER in the last 72 hours. Hemoglobin A1C No results for input(s): HGBA1C in the last 72 hours. Fasting Lipid Panel No results  for input(s): CHOL, HDL, LDLCALC, TRIG, CHOLHDL, LDLDIRECT in the last 72 hours. Thyroid Function Tests  Recent Labs  06/14/16 0415  TSH 1.287    Radiology/Studies:  Ct Angio Chest Pe W And/or Wo Contrast  Result Date: 06/14/2016 CLINICAL DATA:  Respiratory distress.  D-dimer 987 EXAM: CT ANGIOGRAPHY CHEST WITH CONTRAST TECHNIQUE: Multidetector CT imaging of the chest was performed using the standard protocol during bolus administration of intravenous contrast. Multiplanar CT image reconstructions and MIPs were obtained to evaluate the vascular anatomy. CONTRAST:  75 mL Isovue 370 COMPARISON:  11/02/2007 FINDINGS: Cardiovascular: Satisfactory opacification of the pulmonary arteries to the segmental level. No evidence of pulmonary embolism. Normal heart size. No pericardial effusion. Aortic atherosclerosis. Mediastinum/Nodes: No enlarged mediastinal, hilar, or axillary lymph nodes. Thyroid gland, trachea, and esophagus demonstrate no significant findings. Lungs/Pleura: Small bilateral pleural effusions with basilar atelectasis. Interstitial prominence in the lung bases and lung periphery consistent with interstitial edema. Multiple focal nodular ground-glass infiltrates  demonstrated in both lungs, likely representing inflammatory process. Largest is in the right upper lung and measures about 12 mm diameter. Airways are patent. No pneumothorax. Upper Abdomen: Small cyst in the lateral segment left lobe of the liver are unchanged since previous study. Surgical absence of the gallbladder. Musculoskeletal: No chest wall abnormality. No acute or significant osseous findings. Review of the MIP images confirms the above findings. IMPRESSION: No evidence of significant pulmonary embolus. Small bilateral pleural effusions with basilar atelectasis and diffuse bilateral peripheral and basilar interstitial edema. **An incidental finding of potential clinical significance has been found. Multiple focal nodular ground-glass opacities in both lungs are nonspecific though probably represent inflammatory or infectious process. Non-contrast chest CT at 3-6 months is recommended. If nodules persist, subsequent management will be based upon the most suspicious nodule(s). This recommendation follows the consensus statement: Guidelines for Management of Incidental Pulmonary Nodules Detected on CT Images: From the Fleischner Society 2017; Radiology 2017; 284:228-243. ** Electronically Signed   By: Lucienne Capers M.D.   On: 06/14/2016 01:07   Dg Chest Port 1 View  Result Date: 06/13/2016 CLINICAL DATA:  70 y/o F; respiratory distress and feeling she could not breathe. EXAM: PORTABLE CHEST 1 VIEW COMPARISON:  07/15/2013 chest radiograph. FINDINGS: The stable cardiomediastinal silhouette within normal limits given projection and technique. Increased interstitial markings probably represents interstitial edema. Ill-defined opacity right lung base may represent atelectasis or pneumonia. No acute osseous abnormality is evident. IMPRESSION: Stable cardiac silhouette. Interstitial edema. Ill-defined right basilar opacity may represent atelectasis or pneumonia. Electronically Signed   By: Kristine Garbe M.D.   On: 06/13/2016 23:30    EKG:EKG shows normal sinus rhythm with no significant ST or T-wave changes   EKG lab work, chest x-ray, echocardiogram reviewed independently by myself  Weights: Filed Weights   06/13/16 2256 06/14/16 0338  Weight: 125 lb (56.7 kg) 127 lb 3.3 oz (57.7 kg)     Physical Exam: Blood pressure (!) 92/59, pulse 89, temperature 97.9 F (36.6 C), temperature source Oral, resp. rate (!) 26, height 4\' 11"  (1.499 m), weight 127 lb 3.3 oz (57.7 kg), SpO2 93 %. Body mass index is 25.69 kg/m. GEN: Well nourished, well developed, in no acute distress.  HEENT: Grossly normal.  Neck: Supple, no JVD, carotid bruits, or masses. Cardiac: RRR, no murmurs, rubs, or gallops. No clubbing, cyanosis, edema.  Radials/DP/PT 2+ and equal bilaterally.  Respiratory:Rales at the bases bilaterally GI: Soft, nontender, nondistended, BS + x 4. MS: no deformity or atrophy. Skin: warm  and dry, no rash. Neuro:  Strength and sensation are intact. Psych: AAOx3.  Normal affect.    Assessment and Plan:   1. Acute respiratory distress COPD exacerbation CT scan concerning for Infiltrate versus vascular congestion  She is on steroids, antibiotics, nebulizers Seen by pulmonary, felt to be less likely pneumonia IV fluids given overnight for low blood pressure We'll have low threshold to giving Lasix if blood pressure permits  2) Hypotension   etiology unclear, less likely infection  will  be cautious with IV diuresis   3) SVT  long stretch this morning lasting more than 20 minutes, very symptomatic Broke with adenosine By her account, numerous episodes over the past year Given low blood pressure, underlying coronary artery disease on CT scan,  started on amiodarone Can change to by mouth regimen tomorrow Would consider outpatient evaluation for ablation  4) History of PVCs  previously treated with metoprolol  beta blocker held for possible bronchospasm and low  blood pressure  5) long smoking history Continues to smoke one pack per day   smoking cessation recommended  6) CAD Seen on CT scan chest, difficult to estimate severity secondary to motion artifact   stressed importance of smoking cessation Treat cholesterol aggressively  no recent lipid panel available  7) aortic atherosclerosis Mild, descending aorta seen on CT scan As above, smoking cessation, treat cholesterol aggressively   long time spent at the bedside treating SVT, adenosine given, long discussion with patient and other family at the bedside. Case discussed with pulmonary  service  Total encounter time more than 110 minutes  Greater than 50% was spent in counseling and coordination of care with the patient   Signed, Esmond Plants, MD, Ph.D [facility] 06/15/2016

## 2016-06-15 NOTE — Progress Notes (Signed)
Per dr Rockey Situ give 6 mgs adenosine stat for HR 190, admiinstered, EKG and CXR being performed now, also oeder flu swab per dr Stevenson Clinch

## 2016-06-15 NOTE — Plan of Care (Signed)
Problem: ICU Phase Progression Outcomes Goal: O2 sats trending toward baseline Outcome: Not Progressing Unable to wean off O2.  Tolerated for a few hours but then desaturated down to 87% and had to be put back on 2L [place] with a SaO2 91%.

## 2016-06-15 NOTE — Consult Note (Addendum)
PULMONARY / CRITICAL CARE MEDICINE   Name: Nicole Norman MRN: [id number] DOB: [birth year]    ADMISSION DATE:  06/13/2016 CONSULTATION DATE:  06/15/16  REFERRING MD :  Dr. Rockey Situ   CHIEF COMPLAINT:   Short of breath, palpitations Reason for consult - sob and COPD, SVT  HISTORY OF PRESENT ILLNESS   70 year old female past medical history of PVCs, tobacco abuse, intermittent shortness of breath, admitted on 06/14/2016 for acute shortness of breath, noted to be on BiPAP for a short time, wean back to nasal cannula and transferred to the ICU. Admitted by hospitalist being treated for community-acquired pneumonia, dyspnea, PVCs, and bronchospasms. Today noted to have runs of ventricular tachyarrhythmia along with SVT, requiring urgent intervention by ICU and cardiology, given adenosine 6 mg which broke her SVT rhythm, peaked at 1 97 bpm, after disruption of SVT rhythm, she was started on amiodarone drip. Pulmonary was consultation for further management of shortness of breath and suspected COPD. Off note, patient was seen in primary clinic in April 2016 forintermittent shoof breath as having a PVCs, she was suspected to have COPD but canceled for further workup for primary function testing and further evaluation.   SIGNIFICANT EVENTS    11/11> admitted for dyspnea and ?CAP, noted with tachycardia and frequent PVC, beta blocker stopped, cardizem increased 11/12>runs of Vtack overnight, noted to have significant run of svt in the 190s, given adenosine, broke SVT, started on amiodarone    PAST MEDICAL HISTORY    :  Past Medical History:  Diagnosis Date  . Dysrhythmia    "Fast and skips beats"  Reports recent echo and stress with Dr Fletcher Anon - [facility]  . Migraine    1x/month - uses Aleve  . Shortness of breath dyspnea    1 flight stairs - winded  . Vertigo    no meds   Past Surgical History:  Procedure Laterality Date  . CESAREAN SECTION    . CHOLECYSTECTOMY    . COLONOSCOPY N/A  12/07/2014   Procedure: COLONOSCOPY;  Surgeon: Lucilla Lame, MD;  Location: [place];  Service: Gastroenterology;  Laterality: N/A;  . ESOPHAGOGASTRODUODENOSCOPY N/A 12/07/2014   Procedure: ESOPHAGOGASTRODUODENOSCOPY (EGD);  Surgeon: Lucilla Lame, MD;  Location: [place];  Service: Gastroenterology;  Laterality: N/A;  . TONSILLECTOMY     Prior to Admission medications   Medication Sig Start Date End Date Taking? Authorizing Provider  aspirin 81 MG tablet Take 81 mg by mouth daily. AM   Yes Historical Provider, MD  Calcium Carbonate-Vitamin D (CALCIUM-VITAMIN D) 500-200 MG-UNIT per tablet Take 1 tablet by mouth daily. 10 AM   Yes Historical Provider, MD  Cholecalciferol (VITAMIN D3) 2000 UNITS TABS Take 1 tablet by mouth daily. 10 AM   Yes Historical Provider, MD  [place] Take by mouth. 07:30 AM   Yes Historical Provider, MD  Inulin (FIBER CHOICE PO) Take by mouth daily. 07:30 AM   Yes Historical Provider, MD  metoprolol tartrate (LOPRESSOR) 25 MG tablet TAKE ONE TABLET BY MOUTH TWICE DAILY 01/03/16  Yes Wellington Hampshire, MD  polyethylene glycol (MIRALAX / GLYCOLAX) packet Take 17 g by mouth daily.    Historical Provider, MD   No Known Allergies   FAMILY HISTORY   Family History  Problem Relation Age of Onset  . Stroke Father   . Alzheimer's disease Mother   . Cancer Brother   . Cancer Brother   . Cancer Sister     breast  . Breast cancer  Sister 28  . Cancer Sister     breast      SOCIAL HISTORY    reports that she has been smoking Cigarettes.  She has a 22.50 pack-year smoking history. She has never used smokeless tobacco. She reports that she does not drink alcohol or use drugs.  Review of Systems  Constitutional: Negative for chills and fever.  HENT: Negative for hearing loss.   Eyes: Negative for blurred vision and double vision.  Respiratory: Positive for shortness of breath. Negative for cough, hemoptysis, sputum production and wheezing.    Cardiovascular: Positive for palpitations. Negative for chest pain, orthopnea and claudication.  Gastrointestinal: Negative for heartburn and nausea.  Genitourinary: Negative for dysuria.  Musculoskeletal: Negative for myalgias.  Skin: Negative for rash.  Endo/Heme/Allergies: Does not bruise/bleed easily.  Psychiatric/Behavioral: Negative for depression.      VITAL SIGNS    Temp:  [97.8 F (36.6 C)-98.5 F (36.9 C)] 97.9 F (36.6 C) (11/12 0724) Pulse Rate:  [44-127] 89 (11/12 0700) Resp:  [17-33] 26 (11/12 0700) BP: (86-109)/(47-69) 92/59 (11/12 0700) SpO2:  [87 %-98 %] 93 % (11/12 0805) HEMODYNAMICS:   VENTILATOR SETTINGS:   INTAKE / OUTPUT:  Intake/Output Summary (Last 24 hours) at 06/15/16 1034 Last data filed at 06/15/16 0500  Gross per 24 hour  Intake             2668 ml  Output              675 ml  Net             1993 ml       PHYSICAL EXAM   Physical Exam  Constitutional: She is oriented to person, place, and time. She appears well-developed and well-nourished.  Initially with the stress, given adenosine 6 mg, now in no acute respiratory distress  HENT:  Head: Normocephalic and atraumatic.  Right Ear: External ear normal.  Left Ear: External ear normal.  Eyes: Conjunctivae and EOM are normal. Pupils are equal, round, and reactive to light.  Neck: Neck supple.  Cardiovascular: Normal heart sounds and intact distal pulses.   No murmur heard. tachycardia  Pulmonary/Chest: Effort normal. No respiratory distress. She has no wheezes. She has rales. She exhibits no tenderness.  Abdominal: Soft. Bowel sounds are normal. She exhibits no distension. There is no tenderness. There is no rebound and no guarding.  Musculoskeletal: Normal range of motion. She exhibits no edema.  Neurological: She is alert and oriented to person, place, and time.  Skin: Skin is warm and dry.  Psychiatric: She has a normal mood and affect.  Nursing note and vitals  reviewed.      LABS   LABS:  CBC  Recent Labs Lab 06/13/16 2300 06/15/16 0815  WBC 20.4* 23.4*  HGB 13.2 11.7*  HCT 39.8 35.6  PLT 196 208   Coag's No results for input(s): APTT, INR in the last 168 hours. BMET  Recent Labs Lab 06/13/16 2300 06/15/16 0815  NA 138 142  K 3.6 3.8  CL 106 111  CO2 22 24  BUN 11 14  CREATININE 0.78 0.79  GLUCOSE 208* 107*   Electrolytes  Recent Labs Lab 06/13/16 2300 06/15/16 0815  CALCIUM 8.8* 8.7*   Sepsis Markers  Recent Labs Lab 06/13/16 2345  LATICACIDVEN 3.0*   ABG No results for input(s): PHART, PCO2ART, PO2ART in the last 168 hours. Liver Enzymes No results for input(s): AST, ALT, ALKPHOS, BILITOT, ALBUMIN in the last 168 hours. Cardiac Enzymes  Recent Labs Lab 06/14/16 0415 06/14/16 0913 06/14/16 1532  TROPONINI 0.11* 0.09* 0.08*   Glucose No results for input(s): GLUCAP in the last 168 hours.   Recent Results (from the past 240 hour(s))  Blood Culture (routine x 2)     Status: None (Preliminary result)   Collection Time: 06/13/16 11:42 PM  Result Value Ref Range Status   Specimen Description BLOOD LEFT HAND  Final   Special Requests BOTTLES DRAWN AEROBIC AND ANAEROBIC 3CC  Final   Culture NO GROWTH < 12 HOURS  Final   Report Status PENDING  Incomplete  Blood Culture (routine x 2)     Status: None (Preliminary result)   Collection Time: 06/13/16 11:42 PM  Result Value Ref Range Status   Specimen Description BLOOD LEFT FOREARM  Final   Special Requests BOTTLES DRAWN AEROBIC AND ANAEROBIC 3CC  Final   Culture NO GROWTH < 12 HOURS  Final   Report Status PENDING  Incomplete  MRSA PCR Screening     Status: None   Collection Time: 06/14/16  3:31 AM  Result Value Ref Range Status   MRSA by PCR NEGATIVE NEGATIVE Final    Comment:        The GeneXpert MRSA Assay (FDA approved for NASAL specimens only), is one component of a comprehensive MRSA colonization surveillance program. It is  not intended to diagnose MRSA infection nor to guide or monitor treatment for MRSA infections.      Current Facility-Administered Medications:  .  acetaminophen (TYLENOL) tablet 650 mg, 650 mg, Oral, Q6H PRN, 650 mg at 06/14/16 1609 **OR** acetaminophen (TYLENOL) suppository 650 mg, 650 mg, Rectal, Q6H PRN, Harrie Foreman, MD .  amiodarone (NEXTERONE) 1.8 mg/mL load via infusion 150 mg, 150 mg, Intravenous, Once, 150 mg at 06/15/16 1027 **FOLLOWED BY** amiodarone (NEXTERONE PREMIX) 360-4.14 MG/200ML-% (1.8 mg/mL) IV infusion, 60 mg/hr, Intravenous, Continuous, Last Rate: 33.3 mL/hr at 06/15/16 1027, 60 mg/hr at 06/15/16 1027 **FOLLOWED BY** amiodarone (NEXTERONE PREMIX) 360-4.14 MG/200ML-% (1.8 mg/mL) IV infusion, 30 mg/hr, Intravenous, Continuous, Minna Merritts, MD .  aspirin EC tablet 81 mg, 81 mg, Oral, Daily, Harrie Foreman, MD, 81 mg at 06/15/16 0945 .  azithromycin (ZITHROMAX) 500 mg in dextrose 5 % 250 mL IVPB, 500 mg, Intravenous, Daily, Gregor Hams, MD, 500 mg at 06/14/16 2321 .  budesonide (PULMICORT) nebulizer solution 0.25 mg, 0.25 mg, Nebulization, BID, Loletha Grayer, MD, 0.25 mg at 06/15/16 0805 .  calcium-vitamin D (OSCAL WITH D) 500-200 MG-UNIT per tablet 1 tablet, 1 tablet, Oral, Daily, Harrie Foreman, MD, 1 tablet at 06/15/16 0945 .  cefTRIAXone (ROCEPHIN) IVPB 1 g, 1 g, Intravenous, Daily, Gregor Hams, MD, 1 g at 06/14/16 2230 .  cholecalciferol (VITAMIN D) tablet 2,000 Units, 2,000 Units, Oral, Daily, Harrie Foreman, MD, 2,000 Units at 06/15/16 0945 .  diltiazem (CARDIZEM CD) 24 hr capsule 240 mg, 240 mg, Oral, Daily, Loletha Grayer, MD, 240 mg at 06/15/16 0945 .  docusate sodium (COLACE) capsule 100 mg, 100 mg, Oral, BID, Harrie Foreman, MD, 100 mg at 06/15/16 0945 .  enoxaparin (LOVENOX) injection 40 mg, 40 mg, Subcutaneous, Q24H, Loletha Grayer, MD, 40 mg at 06/14/16 2331 .  ipratropium-albuterol (DUONEB) 0.5-2.5 (3) MG/3ML nebulizer  solution 3 mL, 3 mL, Nebulization, Q6H, Loletha Grayer, MD, 3 mL at 06/15/16 0805 .  ondansetron (ZOFRAN) tablet 4 mg, 4 mg, Oral, Q6H PRN **OR** ondansetron (ZOFRAN) injection 4 mg, 4 mg, Intravenous, Q6H PRN, Harrie Foreman, MD .  polyethylene glycol (MIRALAX / GLYCOLAX) packet 17 g, 17 g, Oral, Daily PRN, Harrie Foreman, MD .  predniSONE (DELTASONE) tablet 30 mg, 30 mg, Oral, Q breakfast, Loletha Grayer, MD, 30 mg at 06/15/16 0735 .  sodium chloride flush (NS) 0.9 % injection 3 mL, 3 mL, Intravenous, Q12H, Harrie Foreman, MD, 3 mL at 06/15/16 0947  IMAGING    No results found.    Indwelling Urinary Catheter continued, requirement due to   Reason to continue Indwelling Urinary Catheter for strict Intake/Output monitoring for hemodynamic instability   Central Line continued, requirement due to   Reason to continue [employer] Monitoring of central venous pressure or other hemodynamic parameters   Ventilator continued, requirement due to, resp failure    Ventilator Sedation RASS 0 to -2   Cultures: BCx2  UC  Sputum  Antibiotics: Azithro 11/11> Rocephin 11/11>  Lines:  Studies: CTA chest  11/11> no PE, Small bilateral pleural effusions with basilar atelectasis and diffuse bilateral peripheral and basilar interstitial edema.Multiple focal nodular ground-glass infiltrates demonstrated in both lungs, likely representing inflammatory process. Largest is in the right upper lung and measures about 12 mm diameter.  DISCUSSION: 18-year-old female past medical history of tobacco abuse, PVCs, now with SVT status post adenosine, and started on amiodarone drip, seen in consultation for shortness of breath and COPD.  ASSESSMENT/PLAN    Acute respiratory failure, hypoxic-resolving Community acquired pneumonia SVT Tobacco abuse Elevated troponin Leukocytosis - stress response PVCs ?COPD RUL GGO Pulmonary Edema Bilateral Lower lobe atelectasis  P: - patient  initially on BiPAP in the ER, weaned off acutely. CT scan of her chest has been reviewed, there is symmetric bilateral basilar vascular engorgement along with small pleural effusions, most consistent with CHF or flash pulmonary edema.  Also, there is some mild emphysematous changes noted at the bases - Patient has been having palpitations and elevated heart rate over the past couple weeks, now with SVT, cardiology following along.  - NO confirmed dx of COPD due to patient not following up in pulmonary clinic for further workup. - majority of breathing issues are related to the [place] - patient counseled on tobacco cessation.  - will start patient on Pulmicort BID during inpatient for bronchospams caused by dysrhythmia.  Further inhalers and workup as an outpatient.  - CXR today with increased vascular engorgement, will also give PRN morphine x 6 doses.  - RUL GGO - further workup as an outpatient.  - incentive spirometry - Prednisone 30mg  x 5 days total.   I have personally obtained a history, examined the patient, evaluated laboratory and imaging results, formulated the assessment and plan and placed orders.  The Patient requires high complexity decision making for assessment and support, frequent evaluation and titration of therapies, application of advanced monitoring technologies and extensive interpretation of multiple databases. Critical Care Time devoted to patient care services described in this note is 45 minutes.   Overall, patient is critically ill, prognosis is guarded. Patient at high risk for cardiac arrest and death.   Vilinda Boehringer, MD [facility] Pager [phone] (please enter 7-digits) On Call Pager [phone] (please enter 7-digits)     06/15/2016, 10:34 AM  Note: This note was prepared with Dragon dictation along with smaller phrase technology. Any transcriptional errors that result from this process are  unintentional.

## 2016-06-16 ENCOUNTER — Other Ambulatory Visit: Payer: Self-pay | Admitting: *Deleted

## 2016-06-16 DIAGNOSIS — I5031 Acute diastolic (congestive) heart failure: Principal | ICD-10-CM

## 2016-06-16 DIAGNOSIS — I340 Nonrheumatic mitral (valve) insufficiency: Secondary | ICD-10-CM

## 2016-06-16 DIAGNOSIS — J189 Pneumonia, unspecified organism: Secondary | ICD-10-CM

## 2016-06-16 LAB — BASIC METABOLIC PANEL
Anion gap: 6 (ref 5–15)
BUN: 12 mg/dL (ref 6–20)
CALCIUM: 8.7 mg/dL — AB (ref 8.9–10.3)
CO2: 25 mmol/L (ref 22–32)
CREATININE: 0.69 mg/dL (ref 0.44–1.00)
Chloride: 109 mmol/L (ref 101–111)
GFR calc Af Amer: >60 mL/min (ref >60)
GFR calc non Af Amer: >60 mL/min (ref >60)
GLUCOSE: 108 mg/dL — AB (ref 65–99)
Potassium: 3.4 mmol/L — ABNORMAL LOW (ref 3.5–5.1)
Sodium: 140 mmol/L (ref 135–145)

## 2016-06-16 LAB — LIPID PANEL
Cholesterol: 138 mg/dL (ref 0–200)
HDL: 50 mg/dL (ref >40)
LDL CALC: 66 mg/dL (ref 0–99)
Total CHOL/HDL Ratio: 2.8 RATIO
Triglycerides: 112 mg/dL (ref <150)
VLDL: 22 mg/dL (ref 0–40)

## 2016-06-16 LAB — CBC
HEMATOCRIT: 34.4 % — AB (ref 35.0–47.0)
Hemoglobin: 11.5 g/dL — ABNORMAL LOW (ref 12.0–16.0)
MCH: 30.8 pg (ref 26.0–34.0)
MCHC: 33.4 g/dL (ref 32.0–36.0)
MCV: 92.1 fL (ref 80.0–100.0)
Platelets: 197 10*3/uL (ref 150–440)
RBC: 3.74 MIL/uL — ABNORMAL LOW (ref 3.80–5.20)
RDW: 13.4 % (ref 11.5–14.5)
WBC: 22.4 10*3/uL — ABNORMAL HIGH (ref 3.6–11.0)

## 2016-06-16 LAB — GLUCOSE, CAPILLARY: Glucose-Capillary: 184 mg/dL — ABNORMAL HIGH (ref 65–99)

## 2016-06-16 MED ORDER — POTASSIUM CHLORIDE CRYS ER 20 MEQ PO TBCR
20.0000 meq | EXTENDED_RELEASE_TABLET | ORAL | Status: AC
  Administered 2016-06-16 (×2): 20 meq via ORAL
  Filled 2016-06-16: qty 1

## 2016-06-16 MED ORDER — DOXYCYCLINE HYCLATE 100 MG PO TABS
100.0000 mg | ORAL_TABLET | Freq: Two times a day (BID) | ORAL | Status: DC
  Administered 2016-06-16 – 2016-06-18 (×5): 100 mg via ORAL
  Filled 2016-06-16 (×5): qty 1

## 2016-06-16 MED ORDER — AMIODARONE HCL 200 MG PO TABS
400.0000 mg | ORAL_TABLET | Freq: Two times a day (BID) | ORAL | Status: DC
  Administered 2016-06-16 – 2016-06-18 (×6): 400 mg via ORAL
  Filled 2016-06-16 (×6): qty 2

## 2016-06-16 MED ORDER — FUROSEMIDE 10 MG/ML IJ SOLN
INTRAMUSCULAR | Status: AC
  Filled 2016-06-16: qty 4

## 2016-06-16 MED ORDER — POTASSIUM CHLORIDE CRYS ER 20 MEQ PO TBCR
20.0000 meq | EXTENDED_RELEASE_TABLET | ORAL | Status: AC
  Administered 2016-06-16 (×2): 20 meq via ORAL
  Filled 2016-06-16 (×2): qty 1

## 2016-06-16 MED ORDER — FUROSEMIDE 10 MG/ML IJ SOLN: 40.0000 mg | Freq: Once | INTRAMUSCULAR | Status: DC

## 2016-06-16 MED ORDER — FUROSEMIDE 10 MG/ML IJ SOLN
40.0000 mg | Freq: Two times a day (BID) | INTRAMUSCULAR | Status: DC
  Administered 2016-06-16 – 2016-06-17 (×3): 40 mg via INTRAVENOUS
  Filled 2016-06-16 (×3): qty 4

## 2016-06-16 MED ORDER — FUROSEMIDE 10 MG/ML IJ SOLN
40.0000 mg | Freq: Once | INTRAMUSCULAR | Status: AC
  Administered 2016-06-16: 40 mg via INTRAVENOUS

## 2016-06-16 NOTE — Progress Notes (Signed)
Pt placed on HFNC due to increased WOB and dropping O2 sats. Pt tolerating well

## 2016-06-16 NOTE — Progress Notes (Signed)
Pt has remained alert and oriented x4 with no c/o pain. Lung sounds clear/diminished to auscultation. RR even and unlabored-tachypnea with exertion in the 30s. Pt transitioned to [facility] -sats <90%; however, tachypnea to 40's-placed back on HFNC at 50% FiO2. ST on cardiac monitor with ST elevation. Pt remains on Amiodarone gtt. Pt with orders to transfer to the floor.

## 2016-06-16 NOTE — Progress Notes (Signed)
A & O. 3 L of oxygen. NSR. Pt has not reported any pain. Takes meds ok. Husband at the bedside. Up to [facility] and tolerated it well. Pt has no further concerns at this time.

## 2016-06-16 NOTE — Progress Notes (Signed)
    Spoke with RN for verbal IV Lasix 40 mg bid (order was repeated) as Epic is not allowing myself or Dr. Fletcher Anon to sign orders at this time.

## 2016-06-16 NOTE — Progress Notes (Signed)
Patient ID: Nicole Norman, female   DOB: 1946/05/01, 70 y.o.   MRN: [id number]    [employer] PROGRESS NOTE  Nicole Norman [id number] DOB: Aug 23, 1945 DOA: 06/13/2016 PCP: Tracie Harrier, MD  HPI/Subjective: Patient feeling some shortness of breath and cough.  Objective: Vitals:   06/16/16 0900 06/16/16 1204  BP: 117/63 118/63  Pulse: 98 (!) 108  Resp: (!) 27 19  Temp:  98.1 F (36.7 C)    Filed Weights   06/13/16 2256 06/14/16 0338 06/16/16 0500  Weight: 56.7 kg (125 lb) 57.7 kg (127 lb 3.3 oz) 58.2 kg (128 lb 4.9 oz)    ROS: Review of Systems  Constitutional: Negative for chills and fever.  Eyes: Negative for blurred vision.  Respiratory: Positive for cough and shortness of breath.   Cardiovascular: Positive for chest pain and palpitations.  Gastrointestinal: Negative for abdominal pain, constipation, diarrhea, nausea and vomiting.  Genitourinary: Negative for dysuria.  Musculoskeletal: Negative for joint pain.  Neurological: Negative for dizziness and headaches.   Exam: Physical Exam  Constitutional: She is oriented to person, place, and time.  HENT:  Nose: No mucosal edema.  Mouth/Throat: No oropharyngeal exudate or posterior oropharyngeal edema.  Eyes: Conjunctivae, EOM and lids are normal. Pupils are equal, round, and reactive to light.  Neck: No JVD present. Carotid bruit is not present. No edema present. No thyroid mass and no thyromegaly present.  Cardiovascular: S1 normal and S2 normal.  Tachycardia present.  Exam reveals no gallop.   No murmur heard. Pulses:      Dorsalis pedis pulses are 2+ on the right side, and 2+ on the left side.  Respiratory: No respiratory distress. She has decreased breath sounds in the right middle field, the right lower field, the left middle field and the left lower field. She has no wheezes. She has no rhonchi. She has no rales.  GI: Soft. Bowel sounds are normal. There is no tenderness.  Musculoskeletal:        Right ankle: She exhibits no swelling.       Left ankle: She exhibits no swelling.  Lymphadenopathy:    She has no cervical adenopathy.  Neurological: She is alert and oriented to person, place, and time. No cranial nerve deficit.  Skin: Skin is warm. No rash noted. Nails show no clubbing.  Psychiatric: She has a normal mood and affect.      Data Reviewed: Basic Metabolic Panel:  Recent Labs Lab 06/13/16 2300 06/15/16 0815 06/16/16 0410  NA 138 142 140  K 3.6 3.8 3.4*  CL 106 111 109  CO2 22 24 25   GLUCOSE 208* 107* 108*  BUN 11 14 12   CREATININE 0.78 0.79 0.69  CALCIUM 8.8* 8.7* 8.7*   CBC:  Recent Labs Lab 06/13/16 2300 06/15/16 0815 06/16/16 0410  WBC 20.4* 23.4* 22.4*  HGB 13.2 11.7* 11.5*  HCT 39.8 35.6 34.4*  MCV 92.6 93.0 92.1  PLT 196 208 197   Cardiac Enzymes:  Recent Labs Lab 06/13/16 2300 06/14/16 0415 06/14/16 0913 06/14/16 1532  TROPONINI 0.05* 0.11* 0.09* 0.08*   BNP (last 3 results)  Recent Labs  06/13/16 2300  BNP 378.0*      Recent Results (from the past 240 hour(s))  Blood Culture (routine x 2)     Status: None (Preliminary result)   Collection Time: 06/13/16 11:42 PM  Result Value Ref Range Status   Specimen Description BLOOD LEFT HAND  Final   Special Requests BOTTLES DRAWN AEROBIC AND ANAEROBIC 3CC  Final   Culture NO GROWTH 3 DAYS  Final   Report Status PENDING  Incomplete  Blood Culture (routine x 2)     Status: None (Preliminary result)   Collection Time: 06/13/16 11:42 PM  Result Value Ref Range Status   Specimen Description BLOOD LEFT FOREARM  Final   Special Requests BOTTLES DRAWN AEROBIC AND ANAEROBIC 3CC  Final   Culture NO GROWTH 3 DAYS  Final   Report Status PENDING  Incomplete  MRSA PCR Screening     Status: None   Collection Time: 06/14/16  3:31 AM  Result Value Ref Range Status   MRSA by PCR NEGATIVE NEGATIVE Final    Comment:        The GeneXpert MRSA Assay (FDA approved for NASAL  specimens only), is one component of a comprehensive MRSA colonization surveillance program. It is not intended to diagnose MRSA infection nor to guide or monitor treatment for MRSA infections.      Studies: Dg Chest 1 View  Result Date: 06/15/2016 CLINICAL DATA:  Dyspnea.  History of dysrhythmia. EXAM: CHEST 1 VIEW COMPARISON:  06/13/2016 FINDINGS: Normal heart size. Small right pleural effusion. Increased from previous exam. Mild diffuse pulmonary edema, increased from previous exam. IMPRESSION: Increase in CHF. Electronically Signed   By: Kerby Moors M.D.   On: 06/15/2016 10:49    Scheduled Meds: . amiodarone  400 mg Oral BID  . aspirin EC  81 mg Oral Daily  . budesonide (PULMICORT) nebulizer solution  0.5 mg Nebulization BID  . calcium-vitamin D  1 tablet Oral Daily  . cholecalciferol  2,000 Units Oral Daily  . diltiazem  240 mg Oral Daily  . docusate sodium  100 mg Oral BID  . doxycycline  100 mg Oral Q12H  . furosemide  40 mg Intravenous BID  . ipratropium-albuterol  3 mL Nebulization Q6H  . sodium chloride flush  3 mL Intravenous Q12H    Assessment/Plan:  1. Acute hypoxic respiratory failure requiring BiPAP on presentation. Patient Was on high flow nasal cannula when I saw her today and converted over to 4 L. 2. SVT. Patient on amiodarone drip yesterday and switched over to oral amiodarone today 3. COPD exacerbation. Patient was given Solu-Medrol by EMS. Prednisone stopped by pulmonary and patient was placed on budesonide nebulizers along with DuoNeb nebulizers. 4. Bilateral pneumonia with groundglass opacities. Patient placed on doxycycline 5. Acute diastolic CHF likely due to rapid heart rate. Lasix 40 mg IV twice a day 6. Tobacco abuse. Refuses nicotine patch. 7. Relative hypotension. Continue to monitor closely  Code Status:     Code Status Orders        Start     Ordered   06/14/16 0330  Full code  Continuous     06/14/16 0329    Code Status History     Date Active Date Inactive Code Status Order ID Comments User Context   This patient has a current code status but no historical code status.    Advance Directive Documentation   [place] Most Recent Value  Type of Advance Directive  Living will  Pre-existing out of facility DNR order (yellow form or pink MOST form)  No data  "MOST" Form in Place?  No data     Family Communication: Daughter at the bedside Disposition Plan: Respiratory status needs to improve prior to disposition  Antibiotics:  Doxycycline  Time spent: 24 minutes  [place], [place]

## 2016-06-16 NOTE — Progress Notes (Signed)
Patient: Nicole Norman / Admit Date: 06/13/2016 / Date of Encounter: 06/16/2016, 7:32 AM   Subjective: On HFNC this morning. Heart rates go into the 120's with exertion in the bed, then return to the low 100's to 90's bpm with rest. Potassium low this morning, getting repletion currently. BP remains soft precluding diuresis.   Review of Systems: Review of Systems  Constitutional: Positive for malaise/fatigue. Negative for chills, diaphoresis, fever and weight loss.  HENT: Negative for congestion.   Eyes: Negative for discharge and redness.  Respiratory: Positive for shortness of breath. Negative for cough, hemoptysis, sputum production and wheezing.   Cardiovascular: Negative for chest pain, palpitations, orthopnea, claudication, leg swelling and PND.  Gastrointestinal: Negative for abdominal pain, blood in stool, heartburn, melena, nausea and vomiting.  Genitourinary: Negative for hematuria.  Musculoskeletal: Negative for falls and myalgias.  Skin: Negative for rash.  Neurological: Positive for weakness. Negative for dizziness, tingling, tremors, sensory change, speech change, focal weakness and loss of consciousness.  Endo/Heme/Allergies: Does not bruise/bleed easily.  Psychiatric/Behavioral: Negative for substance abuse. The patient is not nervous/anxious.   All other systems reviewed and are negative.   Objective: Telemetry: NSR, 90's bpm at rest, develops sinus tachycardia into the 120's bpm with minimal exertion in the bed, with rest heart rate improves to the low 100's to 90's bpm Physical Exam: Blood pressure 95/64, pulse 90, temperature 98.7 F (37.1 C), temperature source Oral, resp. rate 18, height 4\' 11"  (1.499 m), weight 128 lb 4.9 oz (58.2 kg), SpO2 96 %. Body mass index is 25.91 kg/m. General: Well developed, well nourished, in no acute distress. Head: Normocephalic, atraumatic, sclera non-icteric, no xanthomas, nares are without discharge. Neck: Negative for  carotid bruits. JVP not elevated. Lungs: Decreased breath sounds bilaterally. Breathing is unlabored. Heart: tachcyardic S1 S2 without murmurs, rubs, or gallops.  Abdomen: Soft, non-tender, non-distended with normoactive bowel sounds. No rebound/guarding. Extremities: No clubbing or cyanosis. No edema. Distal pedal pulses are 2+ and equal bilaterally. Neuro: Alert and oriented X 3. Moves all extremities spontaneously. Psych:  Responds to questions appropriately with a normal affect.   Intake/Output Summary (Last 24 hours) at 06/16/16 0732 Last data filed at 06/16/16 0500  Gross per 24 hour  Intake           1149.2 ml  Output             1575 ml  Net           -425.8 ml    Inpatient Medications:  . aspirin EC  81 mg Oral Daily  . azithromycin  500 mg Intravenous Daily  . budesonide (PULMICORT) nebulizer solution  0.5 mg Nebulization BID  . calcium-vitamin D  1 tablet Oral Daily  . cefTRIAXone  1 g Intravenous Daily  . cholecalciferol  2,000 Units Oral Daily  . diltiazem  240 mg Oral Daily  . docusate sodium  100 mg Oral BID  . enoxaparin (LOVENOX) injection  40 mg Subcutaneous Q24H  . ipratropium-albuterol  3 mL Nebulization Q6H  . potassium chloride  20 mEq Oral Q4H  . predniSONE  30 mg Oral Q breakfast  . sodium chloride flush  3 mL Intravenous Q12H   Infusions:  . amiodarone 30 mg/hr (06/16/16 0500)    Labs:  Recent Labs  06/15/16 0815 06/16/16 0410  NA 142 140  K 3.8 3.4*  CL 111 109  CO2 24 25  GLUCOSE 107* 108*  BUN 14 12  CREATININE 0.79 0.69  CALCIUM 8.7* 8.7*   No results for input(s): AST, ALT, ALKPHOS, BILITOT, PROT, ALBUMIN in the last 72 hours.  Recent Labs  06/15/16 0815 06/16/16 0410  WBC 23.4* 22.4*  HGB 11.7* 11.5*  HCT 35.6 34.4*  MCV 93.0 92.1  PLT 208 197    Recent Labs  06/13/16 2300 06/14/16 0415 06/14/16 0913 06/14/16 1532  TROPONINI 0.05* 0.11* 0.09* 0.08*   Invalid input(s): POCBNP  Recent Labs  06/14/16 0415  HGBA1C  5.4     Weights: Filed Weights   06/13/16 2256 06/14/16 0338 06/16/16 0500  Weight: 125 lb (56.7 kg) 127 lb 3.3 oz (57.7 kg) 128 lb 4.9 oz (58.2 kg)     Radiology/Studies:  Dg Chest 1 View  Result Date: 06/15/2016 CLINICAL DATA:  Dyspnea.  History of dysrhythmia. EXAM: CHEST 1 VIEW COMPARISON:  06/13/2016 FINDINGS: Normal heart size. Small right pleural effusion. Increased from previous exam. Mild diffuse pulmonary edema, increased from previous exam. IMPRESSION: Increase in CHF. Electronically Signed   By: Kerby Moors M.D.   On: 06/15/2016 10:49   Ct Angio Chest Pe W And/or Wo Contrast  Result Date: 06/14/2016 CLINICAL DATA:  Respiratory distress.  D-dimer 987 EXAM: CT ANGIOGRAPHY CHEST WITH CONTRAST TECHNIQUE: Multidetector CT imaging of the chest was performed using the standard protocol during bolus administration of intravenous contrast. Multiplanar CT image reconstructions and MIPs were obtained to evaluate the vascular anatomy. CONTRAST:  75 mL Isovue 370 COMPARISON:  11/02/2007 FINDINGS: Cardiovascular: Satisfactory opacification of the pulmonary arteries to the segmental level. No evidence of pulmonary embolism. Normal heart size. No pericardial effusion. Aortic atherosclerosis. Mediastinum/Nodes: No enlarged mediastinal, hilar, or axillary lymph nodes. Thyroid gland, trachea, and esophagus demonstrate no significant findings. Lungs/Pleura: Small bilateral pleural effusions with basilar atelectasis. Interstitial prominence in the lung bases and lung periphery consistent with interstitial edema. Multiple focal nodular ground-glass infiltrates demonstrated in both lungs, likely representing inflammatory process. Largest is in the right upper lung and measures about 12 mm diameter. Airways are patent. No pneumothorax. Upper Abdomen: Small cyst in the lateral segment left lobe of the liver are unchanged since previous study. Surgical absence of the gallbladder. Musculoskeletal: No chest  wall abnormality. No acute or significant osseous findings. Review of the MIP images confirms the above findings. IMPRESSION: No evidence of significant pulmonary embolus. Small bilateral pleural effusions with basilar atelectasis and diffuse bilateral peripheral and basilar interstitial edema. **An incidental finding of potential clinical significance has been found. Multiple focal nodular ground-glass opacities in both lungs are nonspecific though probably represent inflammatory or infectious process. Non-contrast chest CT at 3-6 months is recommended. If nodules persist, subsequent management will be based upon the most suspicious nodule(s). This recommendation follows the consensus statement: Guidelines for Management of Incidental Pulmonary Nodules Detected on CT Images: From the Fleischner Society 2017; Radiology 2017; 284:228-243. ** Electronically Signed   By: Lucienne Capers M.D.   On: 06/14/2016 01:07   Dg Chest Port 1 View  Result Date: 06/13/2016 CLINICAL DATA:  70 y/o F; respiratory distress and feeling she could not breathe. EXAM: PORTABLE CHEST 1 VIEW COMPARISON:  07/15/2013 chest radiograph. FINDINGS: The stable cardiomediastinal silhouette within normal limits given projection and technique. Increased interstitial markings probably represents interstitial edema. Ill-defined opacity right lung base may represent atelectasis or pneumonia. No acute osseous abnormality is evident. IMPRESSION: Stable cardiac silhouette. Interstitial edema. Ill-defined right basilar opacity may represent atelectasis or pneumonia. Electronically Signed   By: Kristine Garbe M.D.   On: 06/13/2016 23:30  Assessment and Plan  Active Problems:   Acute respiratory failure with hypoxia (HCC)   SVT (supraventricular tachycardia) (HCC)   Aortic atherosclerosis (HCC)   Coronary artery disease involving native coronary artery of native heart without angina pectoris   Smoker    1. Acute respiratory  distress with hypoxia: -Likely multifactorial including possible COPD exacerbation, acute on chronic diastolic CHF, and tachyarrhythmia -Wean oxygen as able -Nebs, ABX, and steroids per IM -Consider changing albuterol to Xopenex, defer to primary -Unable to diurese given soft BP  2. PSVT: -Currently in NSR with heart rates in the 90's bpm -Heart rate will increase to the 120's with exertion in the bed -Give high risk of recurrent arrhythmia and tachycardic rates with minimal exertion given hypotension, volume overloaded, and lung function may want to continue IV amiodarone infusion for today for rate and rhythm control -Consider changing to PO amiodarone on 11/14 -Cannot use beta blocker given hypotension  -Replete potassium -Needs outpatient EP evaluation for possible SVT ablation   3. Hypotension: -Uncertain etiology -Will need to be cautious with diuresis   4. Acute on chronic diastolic CHF: -Unable to diurese at this time given hypotension -Not on beta blocker given hypotension   5. History of PVCs: -Previously on metoprolol, on hold at this time as above  6. CAD: -No symptoms concerning for angina -Not on beta blocker as above -ASA -Check lipid panel -Recent echo 06/05/16 with normal LV systolic function and normal PASP  7. Tobacco abuse: -Cessation advised    Signed, Christell Faith, PA-C [facility] Pager: [phone] 06/16/2016, 7:32 AM

## 2016-06-16 NOTE — Progress Notes (Signed)
Attempted to wean patient from HFNC to regular [place] per Dr. Leonidas Romberg request. Placed on 4l [place] and will continue to monitor.

## 2016-06-16 NOTE — Progress Notes (Signed)
Transferred patient to 2A on 4L [place]. Patient stated that she felt fine on [place] once in her new room, Sats on 4L were 96%. Left on 4L and notified RN of change. HFNC left on standby at bedside. Will continue to monitor.

## 2016-06-16 NOTE — Progress Notes (Signed)
Decreased Fio2 to 40% through HFNC. Sats prior to change were 97%. Will continue to monitor.

## 2016-06-16 NOTE — Progress Notes (Signed)
No new complaints. No distress @ rest. Dyspneic and tachycardic with minimal exertion  Vitals:   06/16/16 0700 06/16/16 0800 06/16/16 0900 06/16/16 1101  BP: 137/69 121/72 117/63   Pulse: (!) 120 (!) 103 98   Resp: (!) 34 (!) 35 (!) 27   Temp: 98.8 F (37.1 C)     TempSrc: Oral     SpO2: 91% 96% 94% 98%  Weight:      Height:       NAD HEENT WNL JVP not visualized Reg, III/VI blowing systolic M > apex NABS, soft Ext warm, no edema  BMP Latest Ref Rng & Units 06/16/2016 06/15/2016 06/13/2016  Glucose 65 - 99 mg/dL 108(H) 107(H) 208(H)  BUN 6 - 20 mg/dL 12 14 11   Creatinine 0.44 - 1.00 mg/dL 0.69 0.79 0.78  BUN/Creat Ratio 11 - 26 - - -  Sodium 135 - 145 mmol/L 140 142 138  Potassium 3.5 - 5.1 mmol/L 3.4(L) 3.8 3.6  Chloride 101 - 111 mmol/L 109 111 106  CO2 22 - 32 mmol/L 25 24 22   Calcium 8.9 - 10.3 mg/dL 8.7(L) 8.7(L) 8.8(L)   CBC Latest Ref Rng & Units 06/16/2016 06/15/2016 06/13/2016  WBC 3.6 - 11.0 K/uL 22.4(H) 23.4(H) 20.4(H)  Hemoglobin 12.0 - 16.0 g/dL 11.5(L) 11.7(L) 13.2  Hematocrit 35.0 - 47.0 % 34.4(L) 35.6 39.8  Platelets 150 - 440 K/uL 197 208 196   CXR 06/15/16: R>L infiltrate and small R effusion  IMPRESSION: Acute hypoxic respiratory failure Pulmonary edema Mitral regurgitation Possible PNA PSVT  PLAN/REC: Change abx to doxycycline and complete 7 days Recheck CXR in AM 11/14 Transition to oral amiodarone Transfer out of SDU to telemetry  Discussed with Dr Fletcher Anon Discussed with Dr Caryn Section, MD PCCM service Mobile [phone] Pager [phone] 06/16/2016

## 2016-06-16 NOTE — Progress Notes (Signed)
Patients RR increased from mid 20's to mid to upper 30's on the 4L [place] with sats 92%. Placed patient back on HFNC of 30L and 50% due to increased RR and WOB, will continue to monitor.

## 2016-06-16 NOTE — Evaluation (Signed)
Physical Therapy Evaluation Patient Details Name: Nicole Norman MRN: [id number] DOB: [birth year] Today's Date: 06/16/2016   History of Present Illness  Pt is a 70 y/o F who presented with c/o SOB.  CT of chest showed no pulmonary embolism but significant interstitial edema as well as small pleural effusions.  Pt's PMH includes vertigo.    Clinical Impression  Pt admitted with above diagnosis. Pt currently with functional limitations due to the deficits listed below (see PT Problem List). 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 She denies any falls over the past 6 months.  SpO2 remains at or above 90% on RA while ambulating 300 ft in hallway, briefly drops to 88% on RA with balance testing.  Placed back on 4L O2 via [place] at end of session.  Pt scored 54/56 on the Berg Balance Test indicating pt has a low risk of falling. Pt will benefit from skilled PT to increase their independence and safety with mobility with focus on energy conservation techniques to allow discharge to the venue listed below.      Follow Up Recommendations No PT follow up    Equipment Recommendations  None recommended by PT    Recommendations for Other Services       Precautions / Restrictions Precautions Precautions: Fall;Other (comment) Precaution Comments: monitor O2 Restrictions Weight Bearing Restrictions: No      Mobility  Bed Mobility Overal bed mobility: Modified Independent             General bed mobility comments: Increased time but no physical assist of cues needed.  Transfers Overall transfer level: Needs assistance Equipment used: None Transfers: Sit to/from Stand Sit to Stand: Min guard         General transfer comment: Min guard as pt mildly unstable but no LOB  Ambulation/Gait Ambulation/Gait assistance: Min guard Ambulation Distance (Feet): 300 Feet Assistive device: None Gait Pattern/deviations: Step-through  pattern;Decreased stride length;Drifts right/left   Gait velocity interpretation: Below normal speed for age/gender General Gait Details: Pt drifting L and R which she reports is her baseline.  No increased instability with high level balance activities and no LOB.  Min guard for safety.  Stairs            Wheelchair Mobility    Modified Rankin (Stroke Patients Only)       Balance Overall balance assessment: Needs assistance (denies h/o falls) Sitting-balance support: No upper extremity supported;Feet supported Sitting balance-Leahy Scale: Good     Standing balance support: No upper extremity supported;During functional activity Standing balance-Leahy Scale: Good               High level balance activites: Backward walking;Direction changes;Turns;Sudden stops;Head turns High Level Balance Comments: No instability noted with high level balance actvities Standardized Balance Assessment Standardized Balance Assessment : Berg Balance Test Berg Balance Test Sit to Stand: Able to stand without using hands and stabilize independently Standing Unsupported: Able to stand safely 2 minutes Sitting with Back Unsupported but Feet Supported on Floor or Stool: Able to sit safely and securely 2 minutes Stand to Sit: Sits safely with minimal use of hands Transfers: Able to transfer safely, minor use of hands Standing Unsupported with Eyes Closed: Able to stand 10 seconds safely Standing Ubsupported with Feet Together: Able to place feet together independently and stand 1 minute safely From Standing, Reach Forward with Outstretched Arm: Can reach forward >12 cm safely (5") From Standing Position, Pick up Object from  Floor: Able to pick up shoe safely and easily From Standing Position, Turn to Look Behind Over each Shoulder: Looks behind from both sides and weight shifts well Turn 360 Degrees: Able to turn 360 degrees safely in 4 seconds or less Standing Unsupported, Alternately Place  Feet on Step/Stool: Able to stand independently and safely and complete 8 steps in 20 seconds Standing Unsupported, One Foot in Front: Able to plae foot ahead of the other independently and hold 30 seconds Standing on One Leg: Able to lift leg independently and hold > 10 seconds Total Score: 54         Pertinent Vitals/Pain Pain Assessment: No/denies pain    Home Living Family/patient expects to be discharged to:: Private residence Living Arrangements: Spouse/significant other Available Help at Discharge: Family;Available PRN/intermittently Type of Home: House         Home Equipment: Kasandra Knudsen - single point      Prior Function Level of Independence: Independent         Comments: Works full time as a Radio broadcast assistant.  If she begins to feel an episode of SOB she takes a break and rests.     Hand Dominance        Extremity/Trunk Assessment   Upper Extremity Assessment: Overall WFL for tasks assessed           Lower Extremity Assessment: Overall WFL for tasks assessed      Cervical / Trunk Assessment: Normal  Communication   Communication: No difficulties  Cognition Arousal/Alertness: Awake/alert Behavior During Therapy: WFL for tasks assessed/performed Overall Cognitive Status: Within Functional Limits for tasks assessed                      General Comments General comments (skin integrity, edema, etc.): SpO2 remains at or above 90% on RA while ambulating, briefly drops to 88% on RA with balance testing.  Placed back on 4L O2 via [place] at end of session.  Pt scored 54/56 on the Berg Balance Test indicating pt has a low risk of falling.  Explained results to pt.    Exercises Other Exercises Other Exercises: Pt encouraged to ambulate in room and in hallway with nursing staff at least 3x/day Other Exercises: Pt educated in and demonstrated pursed lip breathing and was encouraged to continue throughout the day   Assessment/Plan    PT Assessment Patient needs  continued PT services  PT Problem List Decreased balance;Decreased safety awareness;Cardiopulmonary status limiting activity          PT Treatment Interventions DME instruction;Gait training;Functional mobility training;Therapeutic activities;Stair training;Therapeutic exercise;Balance training;Neuromuscular re-education;Patient/family education    PT Goals (Current goals can be found in the Care Plan section)  Acute Rehab PT Goals Patient Stated Goal: to go home PT Goal Formulation: With patient/family Time For Goal Achievement: 06/23/16 Potential to Achieve Goals: Good    Frequency Min 2X/week   Barriers to discharge        Co-evaluation               End of Session Equipment Utilized During Treatment: Gait belt;Oxygen Activity Tolerance: Patient tolerated treatment well Patient left: with call bell/phone within reach;in bed;with family/visitor present Nurse Communication: Mobility status;Other (comment) (SpO2)         Time: [id number] PT Time Calculation (min) (ACUTE ONLY): 22 min   Charges:   PT Evaluation $PT Eval Low Complexity: 1 Procedure     PT G Codes:        Collie Siad PT,  DPT 06/16/2016, 4:51 PM

## 2016-06-16 NOTE — Progress Notes (Signed)
[facility] ADULT ICU REPLACEMENT PROTOCOL FOR AM LAB REPLACEMENT ONLY  The patient does apply for the [facility] Adult ICU Electrolyte Replacment Protocol based on the criteria listed below:   1. Is GFR >/= 40 ml/min? Yes.    Patient's GFR today is >60 2. Is urine output >/= 0.5 ml/kg/hr for the last 6 hours? Yes.   Patient's UOP is 1.43 ml/kg/hr 3. Is BUN < 60 mg/dL? Yes.    Patient's BUN today is 12 4. Abnormal electrolyte(s): K= 3.4 5. Ordered repletion with: per protocol 6. If a panic level lab has been reported, has the CCM MD in charge been notified? Yes.  .   Physician:  Dr. Corrie Dandy  Kathyrn Drown Dollar 06/16/2016 5:33 AM

## 2016-06-16 NOTE — Progress Notes (Signed)
Pt remains on HFNC tolerating well 50% 30L sats 94%.

## 2016-06-17 ENCOUNTER — Inpatient Hospital Stay: Payer: PPO

## 2016-06-17 DIAGNOSIS — I5030 Unspecified diastolic (congestive) heart failure: Secondary | ICD-10-CM

## 2016-06-17 LAB — PROTIME-INR
INR: 1.01
PROTHROMBIN TIME: 13.3 s (ref 11.4–15.2)

## 2016-06-17 LAB — CBC
HCT: 37.8 % (ref 35.0–47.0)
Hemoglobin: 12.9 g/dL (ref 12.0–16.0)
MCH: 31.2 pg (ref 26.0–34.0)
MCHC: 34.1 g/dL (ref 32.0–36.0)
MCV: 91.6 fL (ref 80.0–100.0)
PLATELETS: 208 10*3/uL (ref 150–440)
RBC: 4.12 MIL/uL (ref 3.80–5.20)
RDW: 13.4 % (ref 11.5–14.5)
WBC: 17.9 10*3/uL — ABNORMAL HIGH (ref 3.6–11.0)

## 2016-06-17 LAB — BASIC METABOLIC PANEL
Anion gap: 9 (ref 5–15)
BUN: 15 mg/dL (ref 6–20)
CALCIUM: 9.4 mg/dL (ref 8.9–10.3)
CO2: 26 mmol/L (ref 22–32)
CREATININE: 0.83 mg/dL (ref 0.44–1.00)
Chloride: 103 mmol/L (ref 101–111)
GLUCOSE: 108 mg/dL — AB (ref 65–99)
Potassium: 4 mmol/L (ref 3.5–5.1)
Sodium: 138 mmol/L (ref 135–145)

## 2016-06-17 MED ORDER — SODIUM CHLORIDE 0.9 % IV SOLN: INTRAVENOUS | Status: DC

## 2016-06-17 MED ORDER — DILTIAZEM HCL ER COATED BEADS 120 MG PO CP24
120.0000 mg | ORAL_CAPSULE | Freq: Every day | ORAL | Status: DC
  Administered 2016-06-18: 120 mg via ORAL
  Filled 2016-06-17: qty 1

## 2016-06-17 MED ORDER — IPRATROPIUM-ALBUTEROL 0.5-2.5 (3) MG/3ML IN SOLN
3.0000 mL | Freq: Three times a day (TID) | RESPIRATORY_TRACT | Status: DC
  Administered 2016-06-18: 3 mL via RESPIRATORY_TRACT
  Filled 2016-06-17: qty 3

## 2016-06-17 NOTE — Progress Notes (Signed)
Patient: Nicole Norman / Admit Date: 06/13/2016 / Date of Encounter: 06/17/2016, 7:56 AM   Subjective: No acute overnight events. Last BP reading soft in the [id number] systolic. Last O2 sat low at 89% on room air. Planning for TEE on 06/18/16 (patient has already eaten this morning). Leukocytosis improving. Potassium at goal.   Review of Systems: Review of Systems  Constitutional: Positive for malaise/fatigue. Negative for chills, diaphoresis, fever and weight loss.  HENT: Negative for congestion.   Eyes: Negative for discharge and redness.  Respiratory: Negative for cough, hemoptysis, sputum production, shortness of breath and wheezing.   Cardiovascular: Negative for chest pain, palpitations, orthopnea, claudication, leg swelling and PND.  Gastrointestinal: Negative for abdominal pain, blood in stool, heartburn, melena, nausea and vomiting.  Genitourinary: Negative for hematuria.  Musculoskeletal: Negative for falls and myalgias.  Skin: Negative for rash.  Neurological: Negative for dizziness, tingling, tremors, sensory change, speech change, focal weakness, loss of consciousness and weakness.  Endo/Heme/Allergies: Does not bruise/bleed easily.  Psychiatric/Behavioral: Negative for substance abuse. The patient is not nervous/anxious.   All other systems reviewed and are negative.   Objective: Telemetry: NSR, 80's bpm, no further episodes of SVT Physical Exam: Blood pressure (!) 93/51, pulse 95, temperature 98.3 F (36.8 C), temperature source Oral, resp. rate 18, height 4\' 11"  (1.499 m), weight 118 lb 11.2 oz (53.8 kg), SpO2 (!) 89 %. Body mass index is 23.97 kg/m. General: Well developed, well nourished, in no acute distress. Head: Normocephalic, atraumatic, sclera non-icteric, no xanthomas, nares are without discharge. Neck: Negative for carotid bruits. JVP not elevated. Lungs: Clear bilaterally to auscultation without wheezes, rales, or rhonchi. Breathing is  unlabored. Heart: RRR S1 S2, III-VI systolic murmur radiating to the apex and back, no rubs, or gallops.  Abdomen: Soft, non-tender, non-distended with normoactive bowel sounds. No rebound/guarding. Extremities: No clubbing or cyanosis. No edema. Distal pedal pulses are 2+ and equal bilaterally. Neuro: Alert and oriented X 3. Moves all extremities spontaneously. Psych:  Responds to questions appropriately with a normal affect.   Intake/Output Summary (Last 24 hours) at 06/17/16 0756 Last data filed at 06/17/16 0653  Gross per 24 hour  Intake           463.48 ml  Output             1200 ml  Net          -736.52 ml    Inpatient Medications:  . amiodarone  400 mg Oral BID  . aspirin EC  81 mg Oral Daily  . budesonide (PULMICORT) nebulizer solution  0.5 mg Nebulization BID  . calcium-vitamin D  1 tablet Oral Daily  . cholecalciferol  2,000 Units Oral Daily  . diltiazem  240 mg Oral Daily  . docusate sodium  100 mg Oral BID  . doxycycline  100 mg Oral Q12H  . furosemide  40 mg Intravenous BID  . ipratropium-albuterol  3 mL Nebulization Q6H  . sodium chloride flush  3 mL Intravenous Q12H   Infusions:   Labs:  Recent Labs  06/16/16 0410 06/17/16 0518  NA 140 138  K 3.4* 4.0  CL 109 103  CO2 25 26  GLUCOSE 108* 108*  BUN 12 15  CREATININE 0.69 0.83  CALCIUM 8.7* 9.4   No results for input(s): AST, ALT, ALKPHOS, BILITOT, PROT, ALBUMIN in the last 72 hours.  Recent Labs  06/16/16 0410 06/17/16 0518  WBC 22.4* 17.9*  HGB 11.5* 12.9  HCT 34.4* 37.8  MCV 92.1 91.6  PLT 197 208    Recent Labs  06/14/16 0913 06/14/16 1532  TROPONINI 0.09* 0.08*   Invalid input(s): POCBNP No results for input(s): HGBA1C in the last 72 hours.   Weights: Filed Weights   06/14/16 0338 06/16/16 0500 06/17/16 0448  Weight: 127 lb 3.3 oz (57.7 kg) 128 lb 4.9 oz (58.2 kg) 118 lb 11.2 oz (53.8 kg)     Radiology/Studies:  Dg Chest 1 View  Result Date: 06/15/2016 CLINICAL DATA:   Dyspnea.  History of dysrhythmia. EXAM: CHEST 1 VIEW COMPARISON:  06/13/2016 FINDINGS: Normal heart size. Small right pleural effusion. Increased from previous exam. Mild diffuse pulmonary edema, increased from previous exam. IMPRESSION: Increase in CHF. Electronically Signed   By: Kerby Moors M.D.   On: 06/15/2016 10:49   Ct Angio Chest Pe W And/or Wo Contrast  Result Date: 06/14/2016 CLINICAL DATA:  Respiratory distress.  D-dimer 987 EXAM: CT ANGIOGRAPHY CHEST WITH CONTRAST TECHNIQUE: Multidetector CT imaging of the chest was performed using the standard protocol during bolus administration of intravenous contrast. Multiplanar CT image reconstructions and MIPs were obtained to evaluate the vascular anatomy. CONTRAST:  75 mL Isovue 370 COMPARISON:  11/02/2007 FINDINGS: Cardiovascular: Satisfactory opacification of the pulmonary arteries to the segmental level. No evidence of pulmonary embolism. Normal heart size. No pericardial effusion. Aortic atherosclerosis. Mediastinum/Nodes: No enlarged mediastinal, hilar, or axillary lymph nodes. Thyroid gland, trachea, and esophagus demonstrate no significant findings. Lungs/Pleura: Small bilateral pleural effusions with basilar atelectasis. Interstitial prominence in the lung bases and lung periphery consistent with interstitial edema. Multiple focal nodular ground-glass infiltrates demonstrated in both lungs, likely representing inflammatory process. Largest is in the right upper lung and measures about 12 mm diameter. Airways are patent. No pneumothorax. Upper Abdomen: Small cyst in the lateral segment left lobe of the liver are unchanged since previous study. Surgical absence of the gallbladder. Musculoskeletal: No chest wall abnormality. No acute or significant osseous findings. Review of the MIP images confirms the above findings. IMPRESSION: No evidence of significant pulmonary embolus. Small bilateral pleural effusions with basilar atelectasis and diffuse  bilateral peripheral and basilar interstitial edema. **An incidental finding of potential clinical significance has been found. Multiple focal nodular ground-glass opacities in both lungs are nonspecific though probably represent inflammatory or infectious process. Non-contrast chest CT at 3-6 months is recommended. If nodules persist, subsequent management will be based upon the most suspicious nodule(s). This recommendation follows the consensus statement: Guidelines for Management of Incidental Pulmonary Nodules Detected on CT Images: From the Fleischner Society 2017; Radiology 2017; 284:228-243. ** Electronically Signed   By: Lucienne Capers M.D.   On: 06/14/2016 01:07   Dg Chest Port 1 View  Result Date: 06/17/2016 CLINICAL DATA:  Respiratory failure coronary artery disease, current smoker. EXAM: PORTABLE CHEST 1 VIEW COMPARISON:  Portable chest x-ray of June 15, 2016 FINDINGS: The lungs are well-expanded. Increased density in the right lower lobe has developed since a previous study. Increased volume of the right pleural effusion has occurred. On the left mild lower lobe atelectatic changes have increased and the small left pleural effusion has increased but is less prominent than that on the right. The heart is top-normal in size. The pulmonary vascularity is not engorged. There is calcification in the wall of the aortic arch The mediastinum is normal in width. The bony thorax exhibits no acute abnormality. IMPRESSION: Worsening bibasilar pneumonia or atelectasis with pleural effusions with the greatest changes noted on the right. Underlying COPD. No pulmonary  edema. Aortic atherosclerosis. Electronically Signed   By: David  Martinique M.D.   On: 06/17/2016 07:11   Dg Chest Port 1 View  Result Date: 06/13/2016 CLINICAL DATA:  70 y/o F; respiratory distress and feeling she could not breathe. EXAM: PORTABLE CHEST 1 VIEW COMPARISON:  07/15/2013 chest radiograph. FINDINGS: The stable cardiomediastinal  silhouette within normal limits given projection and technique. Increased interstitial markings probably represents interstitial edema. Ill-defined opacity right lung base may represent atelectasis or pneumonia. No acute osseous abnormality is evident. IMPRESSION: Stable cardiac silhouette. Interstitial edema. Ill-defined right basilar opacity may represent atelectasis or pneumonia. Electronically Signed   By: Kristine Garbe M.D.   On: 06/13/2016 23:30     Assessment and Plan  Active Problems:   Acute respiratory failure with hypoxia (HCC)   SVT (supraventricular tachycardia) (HCC)   Aortic atherosclerosis (HCC)   Coronary artery disease involving native coronary artery of native heart without angina pectoris   Smoker    1. Acute respiratory distress with hypoxia: -Improved, now on room air -Most recent pulse ox of 89% on room air, update this morning -Nebs, ABX, and steroids per IM  2. PSVT/PVCs: -Currently in NSR with heart rates in the 80's bpm -Continue PO amiodarone for suppression of PVCs -Unable to use beta blocker given hypotension  -Potassium repleted -Plan for outpatient EP evaluation for SVT ablation   3. Mitral regurgitation: -Plan for TEE on 11/15 with Dr. Saunders Revel to better evaluate degree of valvular heart disease and its role in her symptoms given changes noted on echo (scheduled for 9 AM on 06/18/16) -NPO after midnight  4. 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 CAD: -No symptoms concerning for angina -Not on beta blocker as above -ASA  Signed, Christell Faith, PA-C [facility] Pager: [phone] 06/17/2016, 7:56 AM

## 2016-06-17 NOTE — Care Management (Signed)
Prior to this admission, patient Independent in all adls, denies issues accessing medical care, obtaining medications or with transportation.  Current with her PCP.   She declines [facility] and any other follow up. No discharge needs identified at present by care manager or members of care team

## 2016-06-17 NOTE — Progress Notes (Signed)
Patient ID: Nicole Norman, female   DOB: 30-Aug-1945, 70 y.o.   MRN: [id number]  [employer] PROGRESS NOTE  Nicole Norman [id number] DOB: Aug 18, 1945 DOA: 06/13/2016 PCP: Tracie Harrier, MD  HPI/Subjective: Patient feeling much better. Offers no complaints. Patient breathing much better  Objective: Vitals:   06/17/16 0907 06/17/16 1140  BP: (!) 111/54 101/60  Pulse: 99 90  Resp:    Temp: 98.2 F (36.8 C) 98.3 F (36.8 C)    Filed Weights   06/14/16 0338 06/16/16 0500 06/17/16 0448  Weight: 57.7 kg (127 lb 3.3 oz) 58.2 kg (128 lb 4.9 oz) 53.8 kg (118 lb 11.2 oz)    ROS: Review of Systems  Constitutional: Negative for chills and fever.  Eyes: Negative for blurred vision.  Respiratory: Positive for shortness of breath. Negative for cough.   Cardiovascular: Negative for chest pain and palpitations.  Gastrointestinal: Negative for abdominal pain, constipation, diarrhea, nausea and vomiting.  Genitourinary: Negative for dysuria.  Musculoskeletal: Negative for joint pain.  Neurological: Negative for dizziness and headaches.   Exam: Physical Exam  Constitutional: She is oriented to person, place, and time.  HENT:  Nose: No mucosal edema.  Mouth/Throat: No oropharyngeal exudate or posterior oropharyngeal edema.  Eyes: Conjunctivae, EOM and lids are normal. Pupils are equal, round, and reactive to light.  Neck: No JVD present. Carotid bruit is not present. No edema present. No thyroid mass and no thyromegaly present.  Cardiovascular: S1 normal and S2 normal.  Tachycardia present.  Exam reveals no gallop.   No murmur heard. Pulses:      Dorsalis pedis pulses are 2+ on the right side, and 2+ on the left side.  Respiratory: No respiratory distress. She has no decreased breath sounds. She has no wheezes. She has no rhonchi. She has no rales.  GI: Soft. Bowel sounds are normal. There is no tenderness.  Musculoskeletal:       Right ankle: She exhibits no  swelling.       Left ankle: She exhibits no swelling.  Lymphadenopathy:    She has no cervical adenopathy.  Neurological: She is alert and oriented to person, place, and time. No cranial nerve deficit.  Skin: Skin is warm. No rash noted. Nails show no clubbing.  Psychiatric: She has a normal mood and affect.      Data Reviewed: Basic Metabolic Panel:  Recent Labs Lab 06/13/16 2300 06/15/16 0815 06/16/16 0410 06/17/16 0518  NA 138 142 140 138  K 3.6 3.8 3.4* 4.0  CL 106 111 109 103  CO2 22 24 25 26   GLUCOSE 208* 107* 108* 108*  BUN 11 14 12 15   CREATININE 0.78 0.79 0.69 0.83  CALCIUM 8.8* 8.7* 8.7* 9.4   CBC:  Recent Labs Lab 06/13/16 2300 06/15/16 0815 06/16/16 0410 06/17/16 0518  WBC 20.4* 23.4* 22.4* 17.9*  HGB 13.2 11.7* 11.5* 12.9  HCT 39.8 35.6 34.4* 37.8  MCV 92.6 93.0 92.1 91.6  PLT 196 208 197 208   Cardiac Enzymes:  Recent Labs Lab 06/13/16 2300 06/14/16 0415 06/14/16 0913 06/14/16 1532  TROPONINI 0.05* 0.11* 0.09* 0.08*   BNP (last 3 results)  Recent Labs  06/13/16 2300  BNP 378.0*      Recent Results (from the past 240 hour(s))  Blood Culture (routine x 2)     Status: None (Preliminary result)   Collection Time: 06/13/16 11:42 PM  Result Value Ref Range Status   Specimen Description BLOOD LEFT HAND  Final   Special Requests BOTTLES  DRAWN AEROBIC AND ANAEROBIC 3CC  Final   Culture NO GROWTH 4 DAYS  Final   Report Status PENDING  Incomplete  Blood Culture (routine x 2)     Status: None (Preliminary result)   Collection Time: 06/13/16 11:42 PM  Result Value Ref Range Status   Specimen Description BLOOD LEFT FOREARM  Final   Special Requests BOTTLES DRAWN AEROBIC AND ANAEROBIC 3CC  Final   Culture NO GROWTH 4 DAYS  Final   Report Status PENDING  Incomplete  MRSA PCR Screening     Status: None   Collection Time: 06/14/16  3:31 AM  Result Value Ref Range Status   MRSA by PCR NEGATIVE NEGATIVE Final    Comment:        The  GeneXpert MRSA Assay (FDA approved for NASAL specimens only), is one component of a comprehensive MRSA colonization surveillance program. It is not intended to diagnose MRSA infection nor to guide or monitor treatment for MRSA infections.      Studies: Dg Chest Port 1 View  Result Date: 06/17/2016 CLINICAL DATA:  Respiratory failure coronary artery disease, current smoker. EXAM: PORTABLE CHEST 1 VIEW COMPARISON:  Portable chest x-ray of June 15, 2016 FINDINGS: The lungs are well-expanded. Increased density in the right lower lobe has developed since a previous study. Increased volume of the right pleural effusion has occurred. On the left mild lower lobe atelectatic changes have increased and the small left pleural effusion has increased but is less prominent than that on the right. The heart is top-normal in size. The pulmonary vascularity is not engorged. There is calcification in the wall of the aortic arch The mediastinum is normal in width. The bony thorax exhibits no acute abnormality. IMPRESSION: Worsening bibasilar pneumonia or atelectasis with pleural effusions with the greatest changes noted on the right. Underlying COPD. No pulmonary edema. Aortic atherosclerosis. Electronically Signed   By: David  Martinique M.D.   On: 06/17/2016 07:11    Scheduled Meds: . amiodarone  400 mg Oral BID  . aspirin EC  81 mg Oral Daily  . budesonide (PULMICORT) nebulizer solution  0.5 mg Nebulization BID  . calcium-vitamin D  1 tablet Oral Daily  . cholecalciferol  2,000 Units Oral Daily  . [START ON 06/18/2016] diltiazem  120 mg Oral Daily  . docusate sodium  100 mg Oral BID  . doxycycline  100 mg Oral Q12H  . furosemide  40 mg Intravenous BID  . ipratropium-albuterol  3 mL Nebulization Q6H  . sodium chloride flush  3 mL Intravenous Q12H    Assessment/Plan:  1. Acute hypoxic respiratory failure requiring BiPAP on presentation. Patient Now on room air 2. SVT. Patient on oral amiodarone and  Cardizem CD. Potentially can get off the Cardizem CD prior to disposition 3. COPD exacerbation. Patient was given Solu-Medrol by EMS. Prednisone stopped by pulmonary and patient was placed on budesonide nebulizers along with DuoNeb nebulizers. 4. Bilateral pneumonia with groundglass opacities. Patient placed on doxycycline 5. Acute diastolic CHF likely due to rapid heart rate. Lasix 40 mg IV twice a day. Lungs are clear today so hopefully can switch over to oral Lasix for tomorrow 6. Tobacco abuse. Refuses nicotine patch. 7. Relative hypotension. Continue to monitor closely\ 8. Cardiology order TEE for tomorrow  Code Status:     Code Status Orders        Start     Ordered   06/14/16 0330  Full code  Continuous     06/14/16 0329  Code Status History    Date Active Date Inactive Code Status Order ID Comments User Context   This patient has a current code status but no historical code status.    Advance Directive Documentation   [place] Most Recent Value  Type of Advance Directive  Living will  Pre-existing out of facility DNR order (yellow form or pink MOST form)  No data  "MOST" Form in Place?  No data     Family Communication: Husband at the bedside Disposition Plan: Potentially home tomorrow after TEE  Antibiotics:  Doxycycline  Time spent: 22 minutes  [place], [place]

## 2016-06-17 NOTE — Consult Note (Signed)
   [facility] CM Inpatient Consult   06/17/2016  Nicole Norman May 03, 1946 [id number]    [facility] Care Management referral received. Telephone call into room to speak [name] to explain and offer [place] Management program services. She pleasantly declines [facility] Care Management follow up. Will make inpatient RNCM aware.    [name], MSN-Ed, RN,BSN [facility] Liaison [phone]

## 2016-06-18 ENCOUNTER — Inpatient Hospital Stay (HOSPITAL_COMMUNITY)
Admit: 2016-06-18 | Discharge: 2016-06-18 | Disposition: A | Discharge status: Home / Self Care | Payer: PPO | Attending: Physician Assistant | Admitting: Physician Assistant

## 2016-06-18 ENCOUNTER — Encounter
Admission: EM | Disposition: A | Discharge status: Other Acute Inpatient Hospital | Payer: Self-pay | Source: Home / Self Care | Attending: Internal Medicine

## 2016-06-18 DIAGNOSIS — I340 Nonrheumatic mitral (valve) insufficiency: Secondary | ICD-10-CM

## 2016-06-18 DIAGNOSIS — J810 Acute pulmonary edema: Secondary | ICD-10-CM

## 2016-06-18 HISTORY — PX: TEE WITHOUT CARDIOVERSION: SHX5443

## 2016-06-18 LAB — BASIC METABOLIC PANEL
Anion gap: 13 (ref 5–15)
BUN: 22 mg/dL — AB (ref 6–20)
CALCIUM: 9.4 mg/dL (ref 8.9–10.3)
CHLORIDE: 100 mmol/L — AB (ref 101–111)
CO2: 25 mmol/L (ref 22–32)
CREATININE: 0.99 mg/dL (ref 0.44–1.00)
GFR calc Af Amer: >60 mL/min (ref >60)
GFR calc non Af Amer: 58 mL/min — ABNORMAL LOW (ref >60)
Glucose, Bld: 137 mg/dL — ABNORMAL HIGH (ref 65–99)
Potassium: 3.5 mmol/L (ref 3.5–5.1)
SODIUM: 138 mmol/L (ref 135–145)

## 2016-06-18 LAB — CBC
HCT: 41.2 % (ref 35.0–47.0)
Hemoglobin: 14.2 g/dL (ref 12.0–16.0)
MCH: 31.3 pg (ref 26.0–34.0)
MCHC: 34.4 g/dL (ref 32.0–36.0)
MCV: 90.9 fL (ref 80.0–100.0)
PLATELETS: 239 10*3/uL (ref 150–440)
RBC: 4.54 MIL/uL (ref 3.80–5.20)
RDW: 13.1 % (ref 11.5–14.5)
WBC: 17.8 10*3/uL — AB (ref 3.6–11.0)

## 2016-06-18 LAB — CULTURE, BLOOD (ROUTINE X 2)
CULTURE: NO GROWTH
Culture: NO GROWTH

## 2016-06-18 SURGERY — ECHOCARDIOGRAM, TRANSESOPHAGEAL
Anesthesia: Moderate Sedation

## 2016-06-18 MED ORDER — ALBUTEROL SULFATE (2.5 MG/3ML) 0.083% IN NEBU: 2.5000 mg | INHALATION_SOLUTION | RESPIRATORY_TRACT | Status: DC | PRN

## 2016-06-18 MED ORDER — TIOTROPIUM BROMIDE MONOHYDRATE 18 MCG IN CAPS
18.0000 ug | ORAL_CAPSULE | Freq: Every day | RESPIRATORY_TRACT | Status: DC
  Administered 2016-06-18: 18 ug via RESPIRATORY_TRACT
  Filled 2016-06-18: qty 5

## 2016-06-18 MED ORDER — SODIUM CHLORIDE 0.9% FLUSH: 3.0000 mL | Freq: Two times a day (BID) | INTRAVENOUS | Status: DC

## 2016-06-18 MED ORDER — FUROSEMIDE 40 MG PO TABS
40.0000 mg | ORAL_TABLET | Freq: Every day | ORAL | Status: DC
  Administered 2016-06-18: 40 mg via ORAL
  Filled 2016-06-18: qty 1

## 2016-06-18 MED ORDER — SODIUM CHLORIDE 0.9 % IV SOLN: 250.0000 mL | INTRAVENOUS | Status: DC | PRN

## 2016-06-18 MED ORDER — SODIUM CHLORIDE 0.9 % IV SOLN
INTRAVENOUS | Status: DC
  Administered 2016-06-18 (×2): via INTRAVENOUS

## 2016-06-18 MED ORDER — POTASSIUM CHLORIDE CRYS ER 20 MEQ PO TBCR
20.0000 meq | EXTENDED_RELEASE_TABLET | Freq: Every day | ORAL | Status: DC
  Administered 2016-06-18: 20 meq via ORAL
  Filled 2016-06-18: qty 1

## 2016-06-18 MED ORDER — FENTANYL CITRATE (PF) 100 MCG/2ML IJ SOLN
INTRAMUSCULAR | Status: AC | PRN
  Administered 2016-06-18: 25 ug via INTRAVENOUS

## 2016-06-18 MED ORDER — SODIUM CHLORIDE 0.9% FLUSH: 3.0000 mL | INTRAVENOUS | Status: DC | PRN

## 2016-06-18 MED ORDER — MIDAZOLAM HCL 5 MG/5ML IJ SOLN
INTRAMUSCULAR | Status: AC
  Filled 2016-06-18: qty 5

## 2016-06-18 MED ORDER — LORAZEPAM 2 MG/ML IJ SOLN: INTRAMUSCULAR | Status: AC | PRN

## 2016-06-18 MED ORDER — FENTANYL CITRATE (PF) 100 MCG/2ML IJ SOLN
INTRAMUSCULAR | Status: AC
  Filled 2016-06-18: qty 2

## 2016-06-18 MED ORDER — MIDAZOLAM HCL 2 MG/2ML IJ SOLN
INTRAMUSCULAR | Status: AC | PRN
  Administered 2016-06-18 (×2): 1 mg via INTRAVENOUS

## 2016-06-18 MED ORDER — SALINE SPRAY 0.65 % NA SOLN
1.0000 | NASAL | Status: DC | PRN
  Administered 2016-06-18: 1 via NASAL
  Filled 2016-06-18: qty 44

## 2016-06-18 MED ORDER — SODIUM CHLORIDE 0.9 % IV SOLN: INTRAVENOUS | Status: DC

## 2016-06-18 MED ORDER — IPRATROPIUM-ALBUTEROL 0.5-2.5 (3) MG/3ML IN SOLN
RESPIRATORY_TRACT | Status: AC
  Filled 2016-06-18: qty 3

## 2016-06-18 MED ORDER — LIDOCAINE VISCOUS 2 % MT SOLN
OROMUCOSAL | Status: AC
  Filled 2016-06-18: qty 15

## 2016-06-18 MED ORDER — BUTAMBEN-TETRACAINE-BENZOCAINE 2-2-14 % EX AERO
INHALATION_SPRAY | CUTANEOUS | Status: AC
  Filled 2016-06-18: qty 20

## 2016-06-18 MED ORDER — SODIUM CHLORIDE FLUSH 0.9 % IV SOLN
INTRAVENOUS | Status: AC
  Filled 2016-06-18: qty 10

## 2016-06-18 NOTE — Progress Notes (Signed)
No new complaints, no distress. Denies dyspnea  Vitals:   06/18/16 1210 06/18/16 1215 06/18/16 1230 06/18/16 1245  BP:  127/69 117/67 113/69  Pulse: 92 (!) 104 97 96  Resp: (!) 28 (!) 21 (!) 25 16  Temp:      TempSrc:      SpO2: 96% 97% 94% 94%  Weight:      Height:       NAD No JVD No wheezes IRIR, + systolic M NABS No edema  BMP Latest Ref Rng & Units 06/18/2016 06/17/2016 06/16/2016  Glucose 65 - 99 mg/dL 137(H) 108(H) 108(H)  BUN 6 - 20 mg/dL 22(H) 15 12  Creatinine 0.44 - 1.00 mg/dL 0.99 0.83 0.69  BUN/Creat Ratio 11 - 26 - - -  Sodium 135 - 145 mmol/L 138 138 140  Potassium 3.5 - 5.1 mmol/L 3.5 4.0 3.4(L)  Chloride 101 - 111 mmol/L 100(L) 103 109  CO2 22 - 32 mmol/L 25 26 25   Calcium 8.9 - 10.3 mg/dL 9.4 9.4 8.7(L)   CBC Latest Ref Rng & Units 06/18/2016 06/17/2016 06/16/2016  WBC 3.6 - 11.0 K/uL 17.8(H) 17.9(H) 22.4(H)  Hemoglobin 12.0 - 16.0 g/dL 14.2 12.9 11.5(L)  Hematocrit 35.0 - 47.0 % 41.2 37.8 34.4(L)  Platelets 150 - 440 K/uL 239 208 197   No new CXR  TEE results reviewed  IMPRESSION: Acute hypoxic respiratory failure Pulmonary edema MVP with severe mitral regurgitation Possible PNA PSVT Smoker Possible COPD  PLAN/REC: Discussed with Dr Leslye Peer DC home on Spiriva once daily and PRN albuterol MDI Counseled re: smoking cessation Plan for cardiac cath 11/16 noted I have scheduled follow up with me in 3-4 weeks with PFTs and CXR prior  PCCM will sign off. Please call if we can be of further assistance  Merton Border, MD PCCM service Mobile [phone] Pager [phone] 06/18/2016

## 2016-06-18 NOTE — Interval H&P Note (Signed)
History and Physical Interval Note:  06/18/2016 11:45 AM  Nicole Norman  has presented today for transesophageal echocardiogram, with the diagnosis of mitral regurgitation  The various methods of treatment have been discussed with the patient and family. After consideration of risks, benefits and other options for treatment, the patient has consented to  Procedure(s): TRANSESOPHAGEAL ECHOCARDIOGRAM (TEE) (N/A) as a surgical intervention .  The patient's history has been reviewed, patient examined, no change in status, stable for surgery.  I have reviewed the patient's chart and labs.  Questions were answered to the patient's satisfaction.     Kassem Kibbe

## 2016-06-18 NOTE — Care Management Important Message (Signed)
Important Message  Patient Details  Name: Nicole Norman MRN: [id number] Date of Birth: [birth year]   Medicare Important Message Given:  Yes    Katrina Stack, RN 06/18/2016, 2:04 PM

## 2016-06-18 NOTE — Progress Notes (Signed)
Cardiology Progress Note  06/18/16 7:17 PM  TEE today showed severe, eccentric MR.  We will proceed with left and right heart catheterization tomorrow to better evaluate her hemodynamics and to exclude significant coronary artery disease.  I have reviewed the risks, indications, and alternatives to cardiac catheterization, possible angioplasty, and stenting with the patient. Risks include but are not limited to bleeding, infection, vascular injury, stroke, myocardial infection, arrhythmia, kidney injury, radiation-related injury in the case of prolonged fluoroscopy use, emergency cardiac surgery, and death. The patient understands the risks of serious complication is 1-2 in [id number] with diagnostic cardiac cath and 1-2% or less with angioplasty/stenting.  The patient will be NPO after midnight.  Nelva Bush, MD [facility] Pager: [phone]

## 2016-06-18 NOTE — CV Procedure (Signed)
    Transesophageal Echocardiogram Note  FRANCY MONT [id number] 11/02/1945  Procedure: Transesophageal Echocardiogram Indications: Mitral regurgitation and heart failure  Procedure Details Consent: Obtained Time Out: Verified patient identification, verified procedure, site/side was marked, verified correct patient position, special equipment/implants available, Radiology Safety Procedures followed,  medications/allergies/relevent history reviewed, required imaging and test results available.  Performed  Medications:  During this procedure the patient is administered a total of Versed 2 mg and Fentanyl 25 mcg  to achieve and maintain moderate conscious sedation.  The patient's heart rate, blood pressure, and oxygen saturation are monitored continuously during the procedure. The period of conscious sedation is 18 minutes, of which I was present face-to-face 100% of this time.  Left Ventrical:  Normal size and contaction  Mitral Valve: Severe prolapse/flail of posterior leaflet with severe eccentric MR (reversal of flow in pulmonary vein)  Aortic Valve: Trileaflet. Normal.  Tricuspid Valve: Normal.  Pulmonic Valve: Normal.  Left Atrium/ Left atrial appendage: No thrombus.  Atrial septum: Normal; no ASA/PFO by color Dopper.  Aorta: Normal root size.  Mild atherosclerosis of descending aorta.   Complications: No apparent complications Patient did tolerate procedure well.  Full report to follow.  Nelva Bush, MD 06/18/2016, 12:16 PM

## 2016-06-18 NOTE — Progress Notes (Signed)
Patient: Nicole Norman / Admit Date: 06/13/2016 / Date of Encounter: 06/18/2016, 7:52 AM   Subjective: No acute overnight events. She is for TEE later today. She appears euvolemic. Labs pending this morning.   Review of Systems: Review of Systems  Constitutional: Positive for malaise/fatigue. Negative for chills, diaphoresis, fever and weight loss.  HENT: Negative for congestion.   Eyes: Negative for discharge and redness.  Respiratory: Positive for shortness of breath. Negative for cough, hemoptysis, sputum production and wheezing.   Cardiovascular: Negative for chest pain, palpitations, orthopnea, claudication, leg swelling and PND.  Gastrointestinal: Negative for abdominal pain, blood in stool, heartburn, melena, nausea and vomiting.  Genitourinary: Negative for hematuria.  Musculoskeletal: Negative for falls and myalgias.  Skin: Negative for rash.  Neurological: Positive for weakness. Negative for dizziness, tingling, tremors, sensory change, speech change, focal weakness and loss of consciousness.  Endo/Heme/Allergies: Does not bruise/bleed easily.  Psychiatric/Behavioral: Negative for substance abuse. The patient is not nervous/anxious.   All other systems reviewed and are negative.   Objective: Telemetry: NSR, 90's, episodes of sinus tachycardia into the 120's to 140's bpm Physical Exam: Blood pressure 123/65, pulse 94, temperature 98.3 F (36.8 C), temperature source Oral, resp. rate 18, height 4\' 11"  (1.499 m), weight 115 lb 12.8 oz (52.5 kg), SpO2 92 %. Body mass index is 23.39 kg/m. General: Well developed, well nourished, in no acute distress. Head: Normocephalic, atraumatic, sclera non-icteric, no xanthomas, nares are without discharge. Neck: Negative for carotid bruits. JVP not elevated. Lungs: Faint crackles along bilateral bases. Breathing is unlabored. Heart: RRR S1 S2. III/VI systolic murmur at the apex radiating to the axilla, no rubs, or gallops.    Abdomen: Soft, non-tender, non-distended with normoactive bowel sounds. No rebound/guarding. Extremities: No clubbing or cyanosis. No edema. Distal pedal pulses are 2+ and equal bilaterally. Neuro: Alert and oriented X 3. Moves all extremities spontaneously. Psych:  Responds to questions appropriately with a normal affect.   Intake/Output Summary (Last 24 hours) at 06/18/16 0752 Last data filed at 06/18/16 0500  Gross per 24 hour  Intake              360 ml  Output             1850 ml  Net            -1490 ml    Inpatient Medications:  . amiodarone  400 mg Oral BID  . aspirin EC  81 mg Oral Daily  . budesonide (PULMICORT) nebulizer solution  0.5 mg Nebulization BID  . calcium-vitamin D  1 tablet Oral Daily  . cholecalciferol  2,000 Units Oral Daily  . diltiazem  120 mg Oral Daily  . docusate sodium  100 mg Oral BID  . doxycycline  100 mg Oral Q12H  . furosemide  40 mg Oral Daily  . ipratropium-albuterol  3 mL Nebulization TID  . potassium chloride  20 mEq Oral Daily  . sodium chloride flush  3 mL Intravenous Q12H   Infusions:  . sodium chloride 20 mL/hr at 06/18/16 [id number]    Labs:  Recent Labs  06/16/16 0410 06/17/16 0518  NA 140 138  K 3.4* 4.0  CL 109 103  CO2 25 26  GLUCOSE 108* 108*  BUN 12 15  CREATININE 0.69 0.83  CALCIUM 8.7* 9.4   No results for input(s): AST, ALT, ALKPHOS, BILITOT, PROT, ALBUMIN in the last 72 hours.  Recent Labs  06/16/16 0410 06/17/16 0518  WBC 22.4* 17.9*  HGB 11.5* 12.9  HCT 34.4* 37.8  MCV 92.1 91.6  PLT 197 208   No results for input(s): CKTOTAL, CKMB, TROPONINI in the last 72 hours. Invalid input(s): POCBNP No results for input(s): HGBA1C in the last 72 hours.   Weights: Filed Weights   06/16/16 0500 06/17/16 0448 06/18/16 0623  Weight: 128 lb 4.9 oz (58.2 kg) 118 lb 11.2 oz (53.8 kg) 115 lb 12.8 oz (52.5 kg)     Radiology/Studies:  Dg Chest 1 View  Result Date: 06/15/2016 CLINICAL DATA:  Dyspnea.  History of  dysrhythmia. EXAM: CHEST 1 VIEW COMPARISON:  06/13/2016 FINDINGS: Normal heart size. Small right pleural effusion. Increased from previous exam. Mild diffuse pulmonary edema, increased from previous exam. IMPRESSION: Increase in CHF. Electronically Signed   By: Kerby Moors M.D.   On: 06/15/2016 10:49   Ct Angio Chest Pe W And/or Wo Contrast  Result Date: 06/14/2016 CLINICAL DATA:  Respiratory distress.  D-dimer 987 EXAM: CT ANGIOGRAPHY CHEST WITH CONTRAST TECHNIQUE: Multidetector CT imaging of the chest was performed using the standard protocol during bolus administration of intravenous contrast. Multiplanar CT image reconstructions and MIPs were obtained to evaluate the vascular anatomy. CONTRAST:  75 mL Isovue 370 COMPARISON:  11/02/2007 FINDINGS: Cardiovascular: Satisfactory opacification of the pulmonary arteries to the segmental level. No evidence of pulmonary embolism. Normal heart size. No pericardial effusion. Aortic atherosclerosis. Mediastinum/Nodes: No enlarged mediastinal, hilar, or axillary lymph nodes. Thyroid gland, trachea, and esophagus demonstrate no significant findings. Lungs/Pleura: Small bilateral pleural effusions with basilar atelectasis. Interstitial prominence in the lung bases and lung periphery consistent with interstitial edema. Multiple focal nodular ground-glass infiltrates demonstrated in both lungs, likely representing inflammatory process. Largest is in the right upper lung and measures about 12 mm diameter. Airways are patent. No pneumothorax. Upper Abdomen: Small cyst in the lateral segment left lobe of the liver are unchanged since previous study. Surgical absence of the gallbladder. Musculoskeletal: No chest wall abnormality. No acute or significant osseous findings. Review of the MIP images confirms the above findings. IMPRESSION: No evidence of significant pulmonary embolus. Small bilateral pleural effusions with basilar atelectasis and diffuse bilateral peripheral and  basilar interstitial edema. **An incidental finding of potential clinical significance has been found. Multiple focal nodular ground-glass opacities in both lungs are nonspecific though probably represent inflammatory or infectious process. Non-contrast chest CT at 3-6 months is recommended. If nodules persist, subsequent management will be based upon the most suspicious nodule(s). This recommendation follows the consensus statement: Guidelines for Management of Incidental Pulmonary Nodules Detected on CT Images: From the Fleischner Society 2017; Radiology 2017; 284:228-243. ** Electronically Signed   By: Lucienne Capers M.D.   On: 06/14/2016 01:07   Dg Chest Port 1 View  Result Date: 06/17/2016 CLINICAL DATA:  Respiratory failure coronary artery disease, current smoker. EXAM: PORTABLE CHEST 1 VIEW COMPARISON:  Portable chest x-ray of June 15, 2016 FINDINGS: The lungs are well-expanded. Increased density in the right lower lobe has developed since a previous study. Increased volume of the right pleural effusion has occurred. On the left mild lower lobe atelectatic changes have increased and the small left pleural effusion has increased but is less prominent than that on the right. The heart is top-normal in size. The pulmonary vascularity is not engorged. There is calcification in the wall of the aortic arch The mediastinum is normal in width. The bony thorax exhibits no acute abnormality. IMPRESSION: Worsening bibasilar pneumonia or atelectasis with pleural effusions with the greatest changes noted on  the right. Underlying COPD. No pulmonary edema. Aortic atherosclerosis. Electronically Signed   By: David  Martinique M.D.   On: 06/17/2016 07:11   Dg Chest Port 1 View  Result Date: 06/13/2016 CLINICAL DATA:  70 y/o F; respiratory distress and feeling she could not breathe. EXAM: PORTABLE CHEST 1 VIEW COMPARISON:  07/15/2013 chest radiograph. FINDINGS: The stable cardiomediastinal silhouette within normal  limits given projection and technique. Increased interstitial markings probably represents interstitial edema. Ill-defined opacity right lung base may represent atelectasis or pneumonia. No acute osseous abnormality is evident. IMPRESSION: Stable cardiac silhouette. Interstitial edema. Ill-defined right basilar opacity may represent atelectasis or pneumonia. Electronically Signed   By: Kristine Garbe M.D.   On: 06/13/2016 23:30     Assessment and Plan  Active Problems:   Acute respiratory failure with hypoxia (HCC)   SVT (supraventricular tachycardia) (HCC)   Aortic atherosclerosis (HCC)   Coronary artery disease involving native coronary artery of native heart without angina pectoris   Smoker    1. Acute respiratory distress with hypoxia: -Improved, now on room air -Ambulated on 11/14 without issues -Nebs, ABX, and steroids per IM  2. PSVT/PVCs: -Currently in NSR with heart rates in the 90's bpm with episodes of sinus tachycardia with heart rates into the 120's to 140's bpm, possibly with ambulation in the hallways -Continue PO amiodarone 400 mg bid for 1 week total, then transition to 200 mg bid x 1 week, then 200 mg daily thereafter for suppression of PVCs -Unable to use beta blocker given hypotension  -Remains on Cardizem CD 120 mg daily -Potassium repleted, bmet pending this morning -Plan for outpatient EP evaluation for SVT ablation   3. Mitral regurgitation: -Plan for TEE on 11/15 with Dr. Saunders Revel to better evaluate degree of valvular heart disease and its role in her symptoms given changes noted on echo (scheduled for 06/18/16) -NPO after midnight  4. 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 CAD: -No symptoms concerning for angina -Not on beta blocker as above -ASA  Signed, Christell Faith, PA-C [facility]  [facility] Pager: [phone] 06/18/2016, 7:52 AM

## 2016-06-18 NOTE — H&P (View-Only) (Signed)
Patient: Nicole Norman / Admit Date: 06/13/2016 / Date of Encounter: 06/17/2016, 7:56 AM   Subjective: No acute overnight events. Last BP reading soft in the [id number] systolic. Last O2 sat low at 89% on room air. Planning for TEE on 06/18/16 (patient has already eaten this morning). Leukocytosis improving. Potassium at goal.   Review of Systems: Review of Systems  Constitutional: Positive for malaise/fatigue. Negative for chills, diaphoresis, fever and weight loss.  HENT: Negative for congestion.   Eyes: Negative for discharge and redness.  Respiratory: Negative for cough, hemoptysis, sputum production, shortness of breath and wheezing.   Cardiovascular: Negative for chest pain, palpitations, orthopnea, claudication, leg swelling and PND.  Gastrointestinal: Negative for abdominal pain, blood in stool, heartburn, melena, nausea and vomiting.  Genitourinary: Negative for hematuria.  Musculoskeletal: Negative for falls and myalgias.  Skin: Negative for rash.  Neurological: Negative for dizziness, tingling, tremors, sensory change, speech change, focal weakness, loss of consciousness and weakness.  Endo/Heme/Allergies: Does not bruise/bleed easily.  Psychiatric/Behavioral: Negative for substance abuse. The patient is not nervous/anxious.   All other systems reviewed and are negative.   Objective: Telemetry: NSR, 80's bpm, no further episodes of SVT Physical Exam: Blood pressure (!) 93/51, pulse 95, temperature 98.3 F (36.8 C), temperature source Oral, resp. rate 18, height 4\' 11"  (1.499 m), weight 118 lb 11.2 oz (53.8 kg), SpO2 (!) 89 %. Body mass index is 23.97 kg/m. General: Well developed, well nourished, in no acute distress. Head: Normocephalic, atraumatic, sclera non-icteric, no xanthomas, nares are without discharge. Neck: Negative for carotid bruits. JVP not elevated. Lungs: Clear bilaterally to auscultation without wheezes, rales, or rhonchi. Breathing is  unlabored. Heart: RRR S1 S2, III-VI systolic murmur radiating to the apex and back, no rubs, or gallops.  Abdomen: Soft, non-tender, non-distended with normoactive bowel sounds. No rebound/guarding. Extremities: No clubbing or cyanosis. No edema. Distal pedal pulses are 2+ and equal bilaterally. Neuro: Alert and oriented X 3. Moves all extremities spontaneously. Psych:  Responds to questions appropriately with a normal affect.   Intake/Output Summary (Last 24 hours) at 06/17/16 0756 Last data filed at 06/17/16 0653  Gross per 24 hour  Intake           463.48 ml  Output             1200 ml  Net          -736.52 ml    Inpatient Medications:  . amiodarone  400 mg Oral BID  . aspirin EC  81 mg Oral Daily  . budesonide (PULMICORT) nebulizer solution  0.5 mg Nebulization BID  . calcium-vitamin D  1 tablet Oral Daily  . cholecalciferol  2,000 Units Oral Daily  . diltiazem  240 mg Oral Daily  . docusate sodium  100 mg Oral BID  . doxycycline  100 mg Oral Q12H  . furosemide  40 mg Intravenous BID  . ipratropium-albuterol  3 mL Nebulization Q6H  . sodium chloride flush  3 mL Intravenous Q12H   Infusions:   Labs:  Recent Labs  06/16/16 0410 06/17/16 0518  NA 140 138  K 3.4* 4.0  CL 109 103  CO2 25 26  GLUCOSE 108* 108*  BUN 12 15  CREATININE 0.69 0.83  CALCIUM 8.7* 9.4   No results for input(s): AST, ALT, ALKPHOS, BILITOT, PROT, ALBUMIN in the last 72 hours.  Recent Labs  06/16/16 0410 06/17/16 0518  WBC 22.4* 17.9*  HGB 11.5* 12.9  HCT 34.4* 37.8  MCV 92.1 91.6  PLT 197 208    Recent Labs  06/14/16 0913 06/14/16 1532  TROPONINI 0.09* 0.08*   Invalid input(s): POCBNP No results for input(s): HGBA1C in the last 72 hours.   Weights: Filed Weights   06/14/16 0338 06/16/16 0500 06/17/16 0448  Weight: 127 lb 3.3 oz (57.7 kg) 128 lb 4.9 oz (58.2 kg) 118 lb 11.2 oz (53.8 kg)     Radiology/Studies:  Dg Chest 1 View  Result Date: 06/15/2016 CLINICAL DATA:   Dyspnea.  History of dysrhythmia. EXAM: CHEST 1 VIEW COMPARISON:  06/13/2016 FINDINGS: Normal heart size. Small right pleural effusion. Increased from previous exam. Mild diffuse pulmonary edema, increased from previous exam. IMPRESSION: Increase in CHF. Electronically Signed   By: Kerby Moors M.D.   On: 06/15/2016 10:49   Ct Angio Chest Pe W And/or Wo Contrast  Result Date: 06/14/2016 CLINICAL DATA:  Respiratory distress.  D-dimer 987 EXAM: CT ANGIOGRAPHY CHEST WITH CONTRAST TECHNIQUE: Multidetector CT imaging of the chest was performed using the standard protocol during bolus administration of intravenous contrast. Multiplanar CT image reconstructions and MIPs were obtained to evaluate the vascular anatomy. CONTRAST:  75 mL Isovue 370 COMPARISON:  11/02/2007 FINDINGS: Cardiovascular: Satisfactory opacification of the pulmonary arteries to the segmental level. No evidence of pulmonary embolism. Normal heart size. No pericardial effusion. Aortic atherosclerosis. Mediastinum/Nodes: No enlarged mediastinal, hilar, or axillary lymph nodes. Thyroid gland, trachea, and esophagus demonstrate no significant findings. Lungs/Pleura: Small bilateral pleural effusions with basilar atelectasis. Interstitial prominence in the lung bases and lung periphery consistent with interstitial edema. Multiple focal nodular ground-glass infiltrates demonstrated in both lungs, likely representing inflammatory process. Largest is in the right upper lung and measures about 12 mm diameter. Airways are patent. No pneumothorax. Upper Abdomen: Small cyst in the lateral segment left lobe of the liver are unchanged since previous study. Surgical absence of the gallbladder. Musculoskeletal: No chest wall abnormality. No acute or significant osseous findings. Review of the MIP images confirms the above findings. IMPRESSION: No evidence of significant pulmonary embolus. Small bilateral pleural effusions with basilar atelectasis and diffuse  bilateral peripheral and basilar interstitial edema. **An incidental finding of potential clinical significance has been found. Multiple focal nodular ground-glass opacities in both lungs are nonspecific though probably represent inflammatory or infectious process. Non-contrast chest CT at 3-6 months is recommended. If nodules persist, subsequent management will be based upon the most suspicious nodule(s). This recommendation follows the consensus statement: Guidelines for Management of Incidental Pulmonary Nodules Detected on CT Images: From the Fleischner Society 2017; Radiology 2017; 284:228-243. ** Electronically Signed   By: Lucienne Capers M.D.   On: 06/14/2016 01:07   Dg Chest Port 1 View  Result Date: 06/17/2016 CLINICAL DATA:  Respiratory failure coronary artery disease, current smoker. EXAM: PORTABLE CHEST 1 VIEW COMPARISON:  Portable chest x-ray of June 15, 2016 FINDINGS: The lungs are well-expanded. Increased density in the right lower lobe has developed since a previous study. Increased volume of the right pleural effusion has occurred. On the left mild lower lobe atelectatic changes have increased and the small left pleural effusion has increased but is less prominent than that on the right. The heart is top-normal in size. The pulmonary vascularity is not engorged. There is calcification in the wall of the aortic arch The mediastinum is normal in width. The bony thorax exhibits no acute abnormality. IMPRESSION: Worsening bibasilar pneumonia or atelectasis with pleural effusions with the greatest changes noted on the right. Underlying COPD. No pulmonary  edema. Aortic atherosclerosis. Electronically Signed   By: David  Martinique M.D.   On: 06/17/2016 07:11   Dg Chest Port 1 View  Result Date: 06/13/2016 CLINICAL DATA:  70 y/o F; respiratory distress and feeling she could not breathe. EXAM: PORTABLE CHEST 1 VIEW COMPARISON:  07/15/2013 chest radiograph. FINDINGS: The stable cardiomediastinal  silhouette within normal limits given projection and technique. Increased interstitial markings probably represents interstitial edema. Ill-defined opacity right lung base may represent atelectasis or pneumonia. No acute osseous abnormality is evident. IMPRESSION: Stable cardiac silhouette. Interstitial edema. Ill-defined right basilar opacity may represent atelectasis or pneumonia. Electronically Signed   By: Kristine Garbe M.D.   On: 06/13/2016 23:30     Assessment and Plan  Active Problems:   Acute respiratory failure with hypoxia (HCC)   SVT (supraventricular tachycardia) (HCC)   Aortic atherosclerosis (HCC)   Coronary artery disease involving native coronary artery of native heart without angina pectoris   Smoker    1. Acute respiratory distress with hypoxia: -Improved, now on room air -Most recent pulse ox of 89% on room air, update this morning -Nebs, ABX, and steroids per IM  2. PSVT/PVCs: -Currently in NSR with heart rates in the 80's bpm -Continue PO amiodarone for suppression of PVCs -Unable to use beta blocker given hypotension  -Potassium repleted -Plan for outpatient EP evaluation for SVT ablation   3. Mitral regurgitation: -Plan for TEE on 11/15 with Dr. Saunders Revel to better evaluate degree of valvular heart disease and its role in her symptoms given changes noted on echo (scheduled for 9 AM on 06/18/16) -NPO after midnight  4. 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 CAD: -No symptoms concerning for angina -Not on beta blocker as above -ASA  Signed, Christell Faith, PA-C [facility] Pager: [phone] 06/17/2016, 7:56 AM

## 2016-06-18 NOTE — Progress Notes (Signed)
Patient ID: Nicole Norman, female   DOB: 02/28/1946, 70 y.o.   MRN: [id number]  [facility] PROGRESS NOTE  Nicole Norman [id number] DOB: [birth year] DOA: 06/13/2016 PCP: Tracie Harrier, MD  HPI/Subjective: Patient feeling much better but willing to stay in the hospital for cardiac catheter tomorrow. Breathing is better no chest pain.  Objective: Vitals:   06/18/16 1245 06/18/16 1415  BP: 113/69 116/61  Pulse: 96 93  Resp: 16   Temp:  97.7 F (36.5 C)    Filed Weights   06/16/16 0500 06/17/16 0448 06/18/16 0623  Weight: 58.2 kg (128 lb 4.9 oz) 53.8 kg (118 lb 11.2 oz) 52.5 kg (115 lb 12.8 oz)    ROS: Review of Systems  Constitutional: Negative for chills and fever.  Eyes: Negative for blurred vision.  Respiratory: Negative for cough and shortness of breath.   Cardiovascular: Negative for chest pain and palpitations.  Gastrointestinal: Negative for abdominal pain, constipation, diarrhea, nausea and vomiting.  Genitourinary: Negative for dysuria.  Musculoskeletal: Negative for joint pain.  Neurological: Negative for dizziness and headaches.   Exam: Physical Exam  Constitutional: She is oriented to person, place, and time.  HENT:  Nose: No mucosal edema.  Mouth/Throat: No oropharyngeal exudate or posterior oropharyngeal edema.  Eyes: Conjunctivae, EOM and lids are normal. Pupils are equal, round, and reactive to light.  Neck: No JVD present. Carotid bruit is not present. No edema present. No thyroid mass and no thyromegaly present.  Cardiovascular: S1 normal and S2 normal.  Exam reveals no gallop.   Murmur heard.  Systolic murmur is present with a grade of 4/6  Pulses:      Dorsalis pedis pulses are 2+ on the right side, and 2+ on the left side.  Respiratory: No respiratory distress. She has no decreased breath sounds. She has no wheezes. She has no rhonchi. She has no rales.  GI: Soft. Bowel sounds are normal. There is no tenderness.   Musculoskeletal:       Right ankle: She exhibits no swelling.       Left ankle: She exhibits no swelling.  Lymphadenopathy:    She has no cervical adenopathy.  Neurological: She is alert and oriented to person, place, and time. No cranial nerve deficit.  Skin: Skin is warm. No rash noted. Nails show no clubbing.  Psychiatric: She has a normal mood and affect.      Data Reviewed: Basic Metabolic Panel:  Recent Labs Lab 06/13/16 2300 06/15/16 0815 06/16/16 0410 06/17/16 0518 06/18/16 0811  NA 138 142 140 138 138  K 3.6 3.8 3.4* 4.0 3.5  CL 106 111 109 103 100*  CO2 22 24 25 26 25   GLUCOSE 208* 107* 108* 108* 137*  BUN 11 14 12 15  22*  CREATININE 0.78 0.79 0.69 0.83 0.99  CALCIUM 8.8* 8.7* 8.7* 9.4 9.4   CBC:  Recent Labs Lab 06/13/16 2300 06/15/16 0815 06/16/16 0410 06/17/16 0518 06/18/16 0811  WBC 20.4* 23.4* 22.4* 17.9* 17.8*  HGB 13.2 11.7* 11.5* 12.9 14.2  HCT 39.8 35.6 34.4* 37.8 41.2  MCV 92.6 93.0 92.1 91.6 90.9  PLT 196 208 197 208 239   Cardiac Enzymes:  Recent Labs Lab 06/13/16 2300 06/14/16 0415 06/14/16 0913 06/14/16 1532  TROPONINI 0.05* 0.11* 0.09* 0.08*   BNP (last 3 results)  Recent Labs  06/13/16 2300  BNP 378.0*      Recent Results (from the past 240 hour(s))  Blood Culture (routine x 2)     Status: None  Collection Time: 06/13/16 11:42 PM  Result Value Ref Range Status   Specimen Description BLOOD LEFT HAND  Final   Special Requests BOTTLES DRAWN AEROBIC AND ANAEROBIC 3CC  Final   Culture NO GROWTH 5 DAYS  Final   Report Status 06/18/2016 FINAL  Final  Blood Culture (routine x 2)     Status: None   Collection Time: 06/13/16 11:42 PM  Result Value Ref Range Status   Specimen Description BLOOD LEFT FOREARM  Final   Special Requests BOTTLES DRAWN AEROBIC AND ANAEROBIC 3CC  Final   Culture NO GROWTH 5 DAYS  Final   Report Status 06/18/2016 FINAL  Final  MRSA PCR Screening     Status: None   Collection Time: 06/14/16   3:31 AM  Result Value Ref Range Status   MRSA by PCR NEGATIVE NEGATIVE Final    Comment:        The GeneXpert MRSA Assay (FDA approved for NASAL specimens only), is one component of a comprehensive MRSA colonization surveillance program. It is not intended to diagnose MRSA infection nor to guide or monitor treatment for MRSA infections.      Studies: Dg Chest Port 1 View  Result Date: 06/17/2016 CLINICAL DATA:  Respiratory failure coronary artery disease, current smoker. EXAM: PORTABLE CHEST 1 VIEW COMPARISON:  Portable chest x-ray of June 15, 2016 FINDINGS: The lungs are well-expanded. Increased density in the right lower lobe has developed since a previous study. Increased volume of the right pleural effusion has occurred. On the left mild lower lobe atelectatic changes have increased and the small left pleural effusion has increased but is less prominent than that on the right. The heart is top-normal in size. The pulmonary vascularity is not engorged. There is calcification in the wall of the aortic arch The mediastinum is normal in width. The bony thorax exhibits no acute abnormality. IMPRESSION: Worsening bibasilar pneumonia or atelectasis with pleural effusions with the greatest changes noted on the right. Underlying COPD. No pulmonary edema. Aortic atherosclerosis. Electronically Signed   By: David  Martinique M.D.   On: 06/17/2016 07:11    Scheduled Meds: . amiodarone  400 mg Oral BID  . aspirin EC  81 mg Oral Daily  . butamben-tetracaine-benzocaine      . calcium-vitamin D  1 tablet Oral Daily  . cholecalciferol  2,000 Units Oral Daily  . diltiazem  120 mg Oral Daily  . docusate sodium  100 mg Oral BID  . fentaNYL      . furosemide  40 mg Oral Daily  . ipratropium-albuterol      . lidocaine      . midazolam      . potassium chloride  20 mEq Oral Daily  . sodium chloride flush  3 mL Intravenous Q12H  . sodium chloride flush      . tiotropium  18 mcg Inhalation Daily     Assessment/Plan:  1. Acute hypoxic respiratory failure requiring BiPAP on presentation. Patient Now on room air. 2. SVT. Patient on oral amiodarone and Cardizem CD.  3. Acute diastolic CHF with severe mitral regurgitation.  Switched to oral Lasix today. Patient will have a cardiac catheterization tomorrow and then will have to be set up for mitral valve replacement at some point 4. Tobacco abuse. Refuses nicotine patch. 5. Relative hypotension. Continue to monitor closely 6. COPD placed on Spiriva, when necessary nebulizers. Budesonide stopped 7. Groundglass opacities which was thought to be pneumonia may likely be heart failure instead. Antibiotics stopped.  Code Status:     Code Status Orders        Start     Ordered   06/14/16 0330  Full code  Continuous     06/14/16 0329    Code Status History    Date Active Date Inactive Code Status Order ID Comments User Context   This patient has a current code status but no historical code status.    Advance Directive Documentation   [place] Most Recent Value  Type of Advance Directive  Living will  Pre-existing out of facility DNR order (yellow form or pink MOST form)  No data  "MOST" Form in Place?  No data     Family Communication: Husband at the bedside Disposition Plan: Potentially home tomorrow After cardiac catheter  Time spent: 25 minutes  Colon, [place]

## 2016-06-18 NOTE — Progress Notes (Signed)
Patient has no acute event overnight. She remained NSR, ambulated one time one the unit, and maintain stable VS. She is NPO for scheduled TEE in AM.

## 2016-06-19 ENCOUNTER — Inpatient Hospital Stay: Payer: PPO

## 2016-06-19 ENCOUNTER — Telehealth: Payer: Self-pay | Admitting: *Deleted

## 2016-06-19 ENCOUNTER — Inpatient Hospital Stay (HOSPITAL_COMMUNITY)
Admission: AD | Admit: 2016-06-19 | Discharge: 2016-06-20 | DRG: 306 | Disposition: A | Discharge status: Home / Self Care | Payer: PPO | Source: Other Acute Inpatient Hospital | Attending: Cardiovascular Disease | Admitting: Cardiovascular Disease

## 2016-06-19 ENCOUNTER — Encounter: Payer: Self-pay | Admitting: Internal Medicine

## 2016-06-19 ENCOUNTER — Encounter
Admission: EM | Disposition: A | Discharge status: Other Acute Inpatient Hospital | Payer: Self-pay | Source: Home / Self Care | Attending: Internal Medicine

## 2016-06-19 ENCOUNTER — Encounter: Payer: Self-pay | Admitting: Thoracic Surgery (Cardiothoracic Vascular Surgery)

## 2016-06-19 DIAGNOSIS — J189 Pneumonia, unspecified organism: Secondary | ICD-10-CM | POA: Diagnosis present

## 2016-06-19 DIAGNOSIS — R0603 Acute respiratory distress: Secondary | ICD-10-CM | POA: Diagnosis present

## 2016-06-19 DIAGNOSIS — Z823 Family history of stroke: Secondary | ICD-10-CM | POA: Diagnosis not present

## 2016-06-19 DIAGNOSIS — Z803 Family history of malignant neoplasm of breast: Secondary | ICD-10-CM | POA: Diagnosis not present

## 2016-06-19 DIAGNOSIS — Z01810 Encounter for preprocedural cardiovascular examination: Secondary | ICD-10-CM | POA: Diagnosis not present

## 2016-06-19 DIAGNOSIS — I5043 Acute on chronic combined systolic (congestive) and diastolic (congestive) heart failure: Secondary | ICD-10-CM | POA: Diagnosis present

## 2016-06-19 DIAGNOSIS — G43909 Migraine, unspecified, not intractable, without status migrainosus: Secondary | ICD-10-CM | POA: Diagnosis present

## 2016-06-19 DIAGNOSIS — I7409 Other arterial embolism and thrombosis of abdominal aorta: Secondary | ICD-10-CM

## 2016-06-19 DIAGNOSIS — J449 Chronic obstructive pulmonary disease, unspecified: Secondary | ICD-10-CM | POA: Diagnosis not present

## 2016-06-19 DIAGNOSIS — J9690 Respiratory failure, unspecified, unspecified whether with hypoxia or hypercapnia: Secondary | ICD-10-CM | POA: Diagnosis not present

## 2016-06-19 DIAGNOSIS — I493 Ventricular premature depolarization: Secondary | ICD-10-CM | POA: Diagnosis present

## 2016-06-19 DIAGNOSIS — J9601 Acute respiratory failure with hypoxia: Secondary | ICD-10-CM | POA: Diagnosis not present

## 2016-06-19 DIAGNOSIS — IMO0001 Reserved for inherently not codable concepts without codable children: Secondary | ICD-10-CM

## 2016-06-19 DIAGNOSIS — Z820 Family history of epilepsy and other diseases of the nervous system: Secondary | ICD-10-CM | POA: Diagnosis not present

## 2016-06-19 DIAGNOSIS — J40 Bronchitis, not specified as acute or chronic: Secondary | ICD-10-CM | POA: Diagnosis present

## 2016-06-19 DIAGNOSIS — Z720 Tobacco use: Secondary | ICD-10-CM | POA: Diagnosis present

## 2016-06-19 DIAGNOSIS — I272 Pulmonary hypertension, unspecified: Secondary | ICD-10-CM | POA: Diagnosis present

## 2016-06-19 DIAGNOSIS — J9600 Acute respiratory failure, unspecified whether with hypoxia or hypercapnia: Secondary | ICD-10-CM | POA: Diagnosis not present

## 2016-06-19 DIAGNOSIS — I5033 Acute on chronic diastolic (congestive) heart failure: Secondary | ICD-10-CM | POA: Diagnosis not present

## 2016-06-19 DIAGNOSIS — I471 Supraventricular tachycardia, unspecified: Secondary | ICD-10-CM | POA: Diagnosis present

## 2016-06-19 DIAGNOSIS — K5900 Constipation, unspecified: Secondary | ICD-10-CM | POA: Diagnosis not present

## 2016-06-19 DIAGNOSIS — Z7982 Long term (current) use of aspirin: Secondary | ICD-10-CM | POA: Diagnosis not present

## 2016-06-19 DIAGNOSIS — I340 Nonrheumatic mitral (valve) insufficiency: Secondary | ICD-10-CM | POA: Diagnosis not present

## 2016-06-19 DIAGNOSIS — Z0389 Encounter for observation for other suspected diseases and conditions ruled out: Secondary | ICD-10-CM

## 2016-06-19 DIAGNOSIS — F17210 Nicotine dependence, cigarettes, uncomplicated: Secondary | ICD-10-CM | POA: Diagnosis not present

## 2016-06-19 DIAGNOSIS — Z23 Encounter for immunization: Secondary | ICD-10-CM | POA: Diagnosis not present

## 2016-06-19 DIAGNOSIS — I5031 Acute diastolic (congestive) heart failure: Secondary | ICD-10-CM | POA: Diagnosis not present

## 2016-06-19 HISTORY — DX: Tobacco use: Z72.0

## 2016-06-19 HISTORY — DX: Encounter for observation for other suspected diseases and conditions ruled out: Z03.89

## 2016-06-19 HISTORY — DX: Supraventricular tachycardia: I47.1

## 2016-06-19 HISTORY — DX: Nonrheumatic mitral (valve) insufficiency: I34.0

## 2016-06-19 HISTORY — DX: Ventricular premature depolarization: I49.3

## 2016-06-19 HISTORY — DX: Chronic obstructive pulmonary disease, unspecified: J44.9

## 2016-06-19 HISTORY — DX: Supraventricular tachycardia, unspecified: I47.10

## 2016-06-19 HISTORY — PX: CARDIAC CATHETERIZATION: SHX172

## 2016-06-19 HISTORY — DX: Reserved for inherently not codable concepts without codable children: IMO0001

## 2016-06-19 LAB — CREATININE, SERUM
CREATININE: 0.97 mg/dL (ref 0.44–1.00)
GFR, EST NON AFRICAN AMERICAN: 59 mL/min — AB (ref >60)

## 2016-06-19 LAB — CBC WITH DIFFERENTIAL/PLATELET
BASOS PCT: 0 %
Basophils Absolute: 0 10*3/uL (ref 0.0–0.1)
EOS ABS: 0.4 10*3/uL (ref 0.0–0.7)
EOS PCT: 4 %
HCT: 35.7 % — ABNORMAL LOW (ref 36.0–46.0)
HEMOGLOBIN: 11.9 g/dL — AB (ref 12.0–15.0)
LYMPHS ABS: 2.5 10*3/uL (ref 0.7–4.0)
Lymphocytes Relative: 21 %
MCH: 30.5 pg (ref 26.0–34.0)
MCHC: 33.3 g/dL (ref 30.0–36.0)
MCV: 91.5 fL (ref 78.0–100.0)
Monocytes Absolute: 1.3 10*3/uL — ABNORMAL HIGH (ref 0.1–1.0)
Monocytes Relative: 11 %
NEUTROS PCT: 64 %
Neutro Abs: 7.7 10*3/uL (ref 1.7–7.7)
PLATELETS: 255 10*3/uL (ref 150–400)
RBC: 3.9 MIL/uL (ref 3.87–5.11)
RDW: 12.7 % (ref 11.5–15.5)
WBC: 11.9 10*3/uL — AB (ref 4.0–10.5)

## 2016-06-19 LAB — BASIC METABOLIC PANEL
Anion gap: 8 (ref 5–15)
BUN: 21 mg/dL — AB (ref 6–20)
CHLORIDE: 102 mmol/L (ref 101–111)
CO2: 26 mmol/L (ref 22–32)
CREATININE: 0.81 mg/dL (ref 0.44–1.00)
Calcium: 8.9 mg/dL (ref 8.9–10.3)
GFR calc Af Amer: >60 mL/min (ref >60)
GFR calc non Af Amer: >60 mL/min (ref >60)
Glucose, Bld: 116 mg/dL — ABNORMAL HIGH (ref 65–99)
Potassium: 3.7 mmol/L (ref 3.5–5.1)
Sodium: 136 mmol/L (ref 135–145)

## 2016-06-19 LAB — MAGNESIUM: Magnesium: 1.9 mg/dL (ref 1.7–2.4)

## 2016-06-19 LAB — PROTIME-INR
INR: 1.08
PROTHROMBIN TIME: 14 s (ref 11.4–15.2)

## 2016-06-19 LAB — BRAIN NATRIURETIC PEPTIDE: B Natriuretic Peptide: 113.3 pg/mL — ABNORMAL HIGH (ref 0.0–100.0)

## 2016-06-19 SURGERY — RIGHT/LEFT HEART CATH AND CORONARY ANGIOGRAPHY
Anesthesia: Moderate Sedation

## 2016-06-19 MED ORDER — CALCIUM CARBONATE-VITAMIN D 500-200 MG-UNIT PO TABS
1.0000 | ORAL_TABLET | Freq: Every day | ORAL | Status: DC
  Filled 2016-06-19: qty 1

## 2016-06-19 MED ORDER — SODIUM CHLORIDE 0.9% FLUSH: 3.0000 mL | Freq: Two times a day (BID) | INTRAVENOUS | Status: DC

## 2016-06-19 MED ORDER — MIDAZOLAM HCL 2 MG/2ML IJ SOLN
INTRAMUSCULAR | Status: AC
  Filled 2016-06-19: qty 2

## 2016-06-19 MED ORDER — AMIODARONE HCL 200 MG PO TABS: 200.0000 mg | ORAL_TABLET | Freq: Two times a day (BID) | ORAL | Status: DC

## 2016-06-19 MED ORDER — ALBUTEROL SULFATE (2.5 MG/3ML) 0.083% IN NEBU: 2.5000 mg | INHALATION_SOLUTION | RESPIRATORY_TRACT | 12 refills | Status: DC | PRN

## 2016-06-19 MED ORDER — FENTANYL CITRATE (PF) 100 MCG/2ML IJ SOLN
INTRAMUSCULAR | Status: AC
  Filled 2016-06-19: qty 2

## 2016-06-19 MED ORDER — FUROSEMIDE 40 MG PO TABS
40.0000 mg | ORAL_TABLET | Freq: Every day | ORAL | Status: DC
Start: 2016-06-19 — End: 2016-06-20
  Administered 2016-06-19 – 2016-06-20 (×2): 40 mg via ORAL
  Filled 2016-06-19 (×2): qty 1

## 2016-06-19 MED ORDER — SODIUM CHLORIDE 0.9 % IV SOLN: 250.0000 mL | INTRAVENOUS | Status: DC | PRN

## 2016-06-19 MED ORDER — SODIUM CHLORIDE 0.9 % IV SOLN
INTRAVENOUS | Status: AC
  Administered 2016-06-19: 12:00:00 via INTRAVENOUS

## 2016-06-19 MED ORDER — IOPAMIDOL (ISOVUE-300) INJECTION 61%
INTRAVENOUS | Status: DC | PRN
  Administered 2016-06-19: 70 mL via INTRA_ARTERIAL

## 2016-06-19 MED ORDER — POTASSIUM CHLORIDE CRYS ER 20 MEQ PO TBCR
20.0000 meq | EXTENDED_RELEASE_TABLET | Freq: Every day | ORAL | Status: DC
  Administered 2016-06-19: 20 meq via ORAL
  Filled 2016-06-19 (×2): qty 1

## 2016-06-19 MED ORDER — TIOTROPIUM BROMIDE MONOHYDRATE 18 MCG IN CAPS: 18.0000 ug | ORAL_CAPSULE | Freq: Every day | RESPIRATORY_TRACT | 12 refills | Status: DC

## 2016-06-19 MED ORDER — TIOTROPIUM BROMIDE MONOHYDRATE 18 MCG IN CAPS
18.0000 ug | ORAL_CAPSULE | Freq: Every day | RESPIRATORY_TRACT | Status: DC
  Administered 2016-06-20: 18 ug via RESPIRATORY_TRACT
  Filled 2016-06-19: qty 5

## 2016-06-19 MED ORDER — HEPARIN (PORCINE) IN NACL 2-0.9 UNIT/ML-% IJ SOLN
INTRAMUSCULAR | Status: AC
  Filled 2016-06-19: qty 500

## 2016-06-19 MED ORDER — SODIUM CHLORIDE 0.9% FLUSH
3.0000 mL | Freq: Two times a day (BID) | INTRAVENOUS | Status: DC
  Administered 2016-06-19 – 2016-06-20 (×2): 3 mL via INTRAVENOUS

## 2016-06-19 MED ORDER — FUROSEMIDE 40 MG PO TABS: 40.0000 mg | ORAL_TABLET | Freq: Every day | ORAL | Status: DC

## 2016-06-19 MED ORDER — TIOTROPIUM BROMIDE MONOHYDRATE 18 MCG IN CAPS
18.0000 ug | ORAL_CAPSULE | Freq: Every day | RESPIRATORY_TRACT | Status: DC
  Filled 2016-06-19: qty 5

## 2016-06-19 MED ORDER — DOCUSATE SODIUM 100 MG PO CAPS
100.0000 mg | ORAL_CAPSULE | Freq: Two times a day (BID) | ORAL | Status: DC
  Filled 2016-06-19: qty 1

## 2016-06-19 MED ORDER — ACETAMINOPHEN 325 MG PO TABS: 650.0000 mg | ORAL_TABLET | ORAL | Status: DC | PRN

## 2016-06-19 MED ORDER — POTASSIUM CHLORIDE CRYS ER 20 MEQ PO TBCR: 20.0000 meq | EXTENDED_RELEASE_TABLET | Freq: Every day | ORAL | Status: DC

## 2016-06-19 MED ORDER — SALINE SPRAY 0.65 % NA SOLN
1.0000 | NASAL | Status: DC | PRN
  Filled 2016-06-19: qty 44

## 2016-06-19 MED ORDER — VITAMIN D 1000 UNITS PO TABS
2000.0000 [IU] | ORAL_TABLET | Freq: Every day | ORAL | Status: DC
  Administered 2016-06-19 – 2016-06-20 (×2): 2000 [IU] via ORAL
  Filled 2016-06-19 (×2): qty 2

## 2016-06-19 MED ORDER — HEPARIN SODIUM (PORCINE) 1000 UNIT/ML IJ SOLN
INTRAMUSCULAR | Status: DC | PRN
  Administered 2016-06-19: 3000 [IU] via INTRAVENOUS

## 2016-06-19 MED ORDER — AMIODARONE HCL 200 MG PO TABS
200.0000 mg | ORAL_TABLET | Freq: Two times a day (BID) | ORAL | Status: DC
  Administered 2016-06-19 – 2016-06-20 (×2): 200 mg via ORAL
  Filled 2016-06-19 (×2): qty 1

## 2016-06-19 MED ORDER — VERAPAMIL HCL 2.5 MG/ML IV SOLN
INTRAVENOUS | Status: DC | PRN
  Administered 2016-06-19: 2.5 mg via INTRAVENOUS

## 2016-06-19 MED ORDER — FENTANYL CITRATE (PF) 100 MCG/2ML IJ SOLN
INTRAMUSCULAR | Status: DC | PRN
  Administered 2016-06-19 (×2): 25 ug via INTRAVENOUS

## 2016-06-19 MED ORDER — DILTIAZEM HCL ER COATED BEADS 120 MG PO CP24
120.0000 mg | ORAL_CAPSULE | Freq: Every day | ORAL | Status: DC
  Administered 2016-06-19 – 2016-06-20 (×2): 120 mg via ORAL
  Filled 2016-06-19 (×2): qty 1

## 2016-06-19 MED ORDER — ENOXAPARIN SODIUM 40 MG/0.4ML ~~LOC~~ SOLN
40.0000 mg | SUBCUTANEOUS | Status: DC
  Administered 2016-06-19: 40 mg via SUBCUTANEOUS
  Filled 2016-06-19: qty 0.4

## 2016-06-19 MED ORDER — POLYETHYLENE GLYCOL 3350 17 G PO PACK
17.0000 g | PACK | Freq: Every day | ORAL | Status: DC
  Filled 2016-06-19: qty 1

## 2016-06-19 MED ORDER — SODIUM CHLORIDE 0.9% FLUSH: 3.0000 mL | INTRAVENOUS | Status: DC | PRN

## 2016-06-19 MED ORDER — ASPIRIN EC 81 MG PO TBEC
81.0000 mg | DELAYED_RELEASE_TABLET | Freq: Every day | ORAL | Status: DC
  Administered 2016-06-19 – 2016-06-20 (×2): 81 mg via ORAL
  Filled 2016-06-19 (×2): qty 1

## 2016-06-19 MED ORDER — DILTIAZEM HCL ER COATED BEADS 120 MG PO CP24: 120.0000 mg | ORAL_CAPSULE | Freq: Every day | ORAL | Status: DC

## 2016-06-19 MED ORDER — ONDANSETRON HCL 4 MG/2ML IJ SOLN: 4.0000 mg | Freq: Four times a day (QID) | INTRAMUSCULAR | Status: DC | PRN

## 2016-06-19 MED ORDER — ACETAMINOPHEN 325 MG PO TABS
650.0000 mg | ORAL_TABLET | Freq: Four times a day (QID) | ORAL | Status: DC | PRN
Start: 2016-06-19 — End: 2017-10-08

## 2016-06-19 MED ORDER — VERAPAMIL HCL 2.5 MG/ML IV SOLN
INTRAVENOUS | Status: AC
  Filled 2016-06-19: qty 2

## 2016-06-19 MED ORDER — HEPARIN SODIUM (PORCINE) 1000 UNIT/ML IJ SOLN
INTRAMUSCULAR | Status: AC
  Filled 2016-06-19: qty 1

## 2016-06-19 MED ORDER — MIDAZOLAM HCL 2 MG/2ML IJ SOLN
INTRAMUSCULAR | Status: DC | PRN
  Administered 2016-06-19 (×2): 1 mg via INTRAVENOUS

## 2016-06-19 SURGICAL SUPPLY — 13 items
CATH INFINITI 5FR ANG PIGTAIL (CATHETERS) ×2 IMPLANT
CATH OPTITORQUE JACKY 4.0 5F (CATHETERS) ×2 IMPLANT
CATH SWANZ 7F THERMO (CATHETERS) ×2 IMPLANT
DEVICE RAD TR BAND REGULAR (VASCULAR PRODUCTS) ×2 IMPLANT
GLIDESHEATH SLEND SS 6F .021 (SHEATH) ×4 IMPLANT
GUIDEWIRE EMER 3M J .025X150CM (WIRE) ×2 IMPLANT
KIT MANI 3VAL PERCEP (MISCELLANEOUS) ×2 IMPLANT
KIT RIGHT HEART (MISCELLANEOUS) ×2 IMPLANT
NEEDLE PERC 18GX7CM (NEEDLE) ×2 IMPLANT
PACK CARDIAC CATH (CUSTOM PROCEDURE TRAY) ×2 IMPLANT
SHEATH PINNACLE 7F 10CM (SHEATH) ×2 IMPLANT
WIRE HITORQ VERSACORE ST 145CM (WIRE) ×2 IMPLANT
WIRE SAFE-T 1.5MM-J .035X260CM (WIRE) ×2 IMPLANT

## 2016-06-19 NOTE — Progress Notes (Signed)
Patient transported off unit with [facility].

## 2016-06-19 NOTE — Telephone Encounter (Signed)
LMOM for pt to return call so that we can get a PFT scheduled prior to hosp f/u with DS. Will await call back.

## 2016-06-19 NOTE — Progress Notes (Signed)
2 cc of air removed from right TR band.  7 cc remain.  Site without any bleeding.

## 2016-06-19 NOTE — Progress Notes (Signed)
2 cc of air removed from TR band.  5 cc remain.

## 2016-06-19 NOTE — Care Management (Signed)
Informed that patient is to transfer today to [facility] for CABG and mitral valve surgery.  [employer] is in network with [facility]

## 2016-06-19 NOTE — Progress Notes (Signed)
Telephone report called to [facility] on [address].

## 2016-06-19 NOTE — Progress Notes (Signed)
[facility] at bedside to transport patient to [facility]. Husband at bedside and advised by [facility] Staff on directions

## 2016-06-19 NOTE — Progress Notes (Signed)
3 cc of air removed from right TR band.  Site without bleeding.  No c/o from patient.

## 2016-06-19 NOTE — Progress Notes (Signed)
2 cc of air removed from right TR band.  Site without bleeding.  3 cc remain.

## 2016-06-19 NOTE — Progress Notes (Signed)
Telephone report given to [employer] with [facility].

## 2016-06-19 NOTE — Progress Notes (Signed)
1 cc of air removed from right TR band.  9 cc remain.  No c/o from patient and no distress noted.

## 2016-06-19 NOTE — Interval H&P Note (Signed)
History and Physical Interval Note:  06/19/2016 7:51 AM  Nicole Norman  has presented today for surgery, with the diagnosis of mitral regurgitation/SOB  The various methods of treatment have been discussed with the patient and family. After consideration of risks, benefits and other options for treatment, the patient has consented to  Procedure(s): Right/Left Heart Cath and Coronary Angiography (N/A) as a surgical intervention .  The patient's history has been reviewed, patient examined, no change in status, stable for surgery.  I have reviewed the patient's chart and labs.  Questions were answered to the patient's satisfaction.     Kathlyn [place]

## 2016-06-19 NOTE — H&P (Signed)
History and Physical  Patient ID: Nicole Norman MRN: [id number], DOB: 1945/10/05 Admit Date: (Not on file) Date of Encounter: 06/19/2016, 10:54 AM Primary Physician: Tracie Harrier, MD Primary Cardiologist: Dr. Fletcher Anon, MD  Chief Complaint: SOB Reason for Admission: Severe mitral valve regurgitation with flail of posterior leaflet  HPI: 70 y.o. female with h/o MVP of posterior leaflet with previously noted moderate to severe mitral valve regurgitation now severe with flailing of the middle scallop of the posterior leaflet by TEE 06/18/2016. Also with history of paroxysmal SVT and frequent PVCs well controlled on beta blocker as an outpatient, prolonged history of tobacco abuse with continued abuse at nearly 1 pack daily, normal coronary arteries by cardiac cath 06/19/2016, chronic combined CHF, mild pulmonary hypertension, migraine disorder, vertigo, and chronic constipation who was transferred to [facility] from [facility] for evaluation and treatment of severe mitral valve disease.   She previously underwent 48-hour Holter monitoring in 2015 that showed a total of 26,000 PVCs over 48 hours. Nuclear stress testing at that time showed normal LV systolic function with no evidence of ischemia. Echo in 2015 showed a normal EF at 60-65%, moderate posterior mitral valve prolapse with moderate to severe mitral regurgitation. Her symptoms were noted to have improved on metoprolol. She was doing well at her last outpatient follow up on 05/15/2016. She was scheduled for outpatient echocardiogram on 06/05/2016 to evaluate her mitral valve disease. This echo showed a normal LV systolic function with an EF of 60-65%, normal wall motion, normal LV diastolic function parameters, moderate prolapse of the posterior leaflet with moderate eccentric regurgitation, left atrium was mildly dilated, RV systolic function was normal, PASP was normal. She was also scheduled to wear a Holter monitor to evaluate PVC burden (had not yet  started this). She was advised to follow up after her Holter was completed. Unfortunately, she presented to [facility] on [facility]10 with increased SOB felt to be in the setting of CAP vs bronchtiis. She did require BiPAP briefly, though was able to be transitioned to nasal cannula upon arrival to the [facility] ICU. On 11/12 she was noted to have approximately 20 minutes of SVT with a rate that peaked at 197 bpm. She was started on amiodarone gtt by PCCM which did not help in rate or rhythm control. Upon cardiology arriving she was given adenosine 6 mg IV push with resolution of SVT and conversion to sinus rhythm in the 90's bpm to low 100's bpm. She was very symptomatic with her SVT. She was felt to be somewhat volume overloaded and started on IV Lasix 40 mg bid with close monitoring of her soft BP in the low [id number] mmHg systolic. She diuresed well and was transitioned to PO Lasix. Her beta blocker was held given poor pulmonary function and she was placed on Cardizem with good response. Her respiratory status continued to improve and she was transitioned to room air without difficulty. She underwent TEE on 11/15 that showed normal EF of 60-65%, flail motion of the middle scallop of the posterior mitral valve leaflet with severe mitral regurgitation. She underwent right and left heart catheterization on 06/19/2016 that showed normal coronary arteries, 4+ mitral regurgitation, LVEDP 14 mmHg, giant V waves with mild pulmonary hypertension, pulmonary pressure was 39/10 with a mean of 27 mmHg. Mildly reduced cardiac output at 3.1 with a cardiac index of 2.09. Her volume status was felt to be acceptable. Her amiodarone was decreased to 200 mg bid (left on 2/2 PVCs rather than SVT). It was recommended  she be transferred to [facility] for evaluation and treatment of her severe mitral valve disease.   Past Medical History:  Diagnosis Date  . Migraine    1x/month - uses Aleve  . Normal coronary arteries    a. cardiac cath 06/19/2016: normal  coronary arteries and left dominant system, LVEDP nl, 4+ mitral regurgitation, RHC showed mildly elevated LVEDP (17mmHg), giant V waves noted on PCWP w/ mild pulmonary HTN., pulmonary pressure 39/10 with a mean of 27 mmHg, mildly reduced cardiac output at 3.1 with a cardiac index of 2.09, recommned mitral valve repair, acceptable volume status  . Paroxysmal SVT (supraventricular tachycardia) ([place])   . PVC's (premature ventricular contractions)    a. Holter 2015 with 26,000 PVCs in 48 hours  . Severe mitral regurgitation    a. TTE 06/05/16: EF 60-65%, nl WM, LV dias fxn nl, mod prolapse of posterior mitral leaflet with mod eccentric regurg, LA mildly dilated, PASP nl; b. TEE 06/18/16: EF 65-70%, LV mildly dilated, no valvular vegetations seen throughout, mitral valve w/ flail motion involving the middle scallop of the post leaflet. Severe regurgitation directed eccentrically, LA mildly dilated  . Shortness of breath dyspnea    1 flight stairs - winded  . Tobacco abuse   . Vertigo    no meds     Most Recent Cardiac Studies: Right/left heart cath 06/19/2016: Conclusion   The left ventricular systolic function is normal.  LV end diastolic pressure is normal.  There is severe (4+) mitral regurgitation.   1. Normal coronary arteries and a left dominant system. 2. Normal LV systolic function with an ejection fraction of 55%. 3. Severe mitral regurgitation with severely dilated left atrium. 4. Right heart catheterization showed mildly elevated left ventricular end-diastolic pressure (14 mmHg), giant V waves noted on pulmonary capillary wedge pressure with mild pulmonary hypertension. Pulmonary pressure was 39/10 with a mean of 27 mmHg. Mildly reduced cardiac output at 3.1 with a cardiac index of 2.09.  Recommendations: I recommend mitral valve repair. The patient volume status appears to be acceptable. Continue same dose of furosemide   TEE 06/18/2016: Study Conclusions - Left ventricle: The  cavity size was mildly dilated. Systolic   function was vigorous. The estimated ejection fraction was in the   range of 65% to 70%. - Aortic valve: No evidence of vegetation. - Mitral valve: Flail motion involving the middle scallop of the   posterior leaflet. There was severe regurgitation directed   eccentrically. Severe regurgitation is suggested by pulmonary   vein systolic flow reversal and a vena contracta width >= 6 mm. - Left atrium: The atrium was dilated. No evidence of thrombus in   the atrial cavity or appendage. - Right atrium: No evidence of thrombus in the atrial cavity or   appendage. - Tricuspid valve: No evidence of vegetation. - Pulmonic valve: No evidence of vegetation.  Impressions: - Flail posterior leaflet of the mitral valve with severe,   eccentric regurgitation.  TTE 06/05/2016: Study Conclusions - Left ventricle: The cavity size was normal. Systolic function was   normal. The estimated ejection fraction was in the range of 60%   to 65%. Wall motion was normal; there were no regional wall   motion abnormalities. Left ventricular diastolic function   parameters were normal. - Mitral valve: Moderate prolapse of the posterior leaflet. There   was moderate eccentric regurgitation. - Left atrium: The atrium was mildly dilated. - Right ventricle: Systolic function was normal. - Pulmonary arteries: Systolic pressure was within  the normal   range.   Surgical History:  Past Surgical History:  Procedure Laterality Date  . CESAREAN SECTION    . CHOLECYSTECTOMY    . COLONOSCOPY N/A 12/07/2014   Procedure: COLONOSCOPY;  Surgeon: Lucilla Lame, MD;  Location: [place];  Service: Gastroenterology;  Laterality: N/A;  . ESOPHAGOGASTRODUODENOSCOPY N/A 12/07/2014   Procedure: ESOPHAGOGASTRODUODENOSCOPY (EGD);  Surgeon: Lucilla Lame, MD;  Location: [place];  Service: Gastroenterology;  Laterality: N/A;  . TEE WITHOUT CARDIOVERSION N/A 06/18/2016    Procedure: TRANSESOPHAGEAL ECHOCARDIOGRAM (TEE);  Surgeon: Nelva Bush, MD;  Location: [facility];  Service: Cardiovascular;  Laterality: N/A;  . TONSILLECTOMY       Home Meds: Prior to Admission medications   Medication Sig Start Date End Date Taking? Authorizing Provider  aspirin 81 MG tablet Take 81 mg by mouth daily. AM    Historical Provider, MD  Calcium Carbonate-Vitamin D (CALCIUM-VITAMIN D) 500-200 MG-UNIT per tablet Take 1 tablet by mouth daily. 10 AM    Historical Provider, MD  Cholecalciferol (VITAMIN D3) 2000 UNITS TABS Take 1 tablet by mouth daily. 10 AM    Historical Provider, MD  [place] Take by mouth. 07:30 AM    Historical Provider, MD  Inulin (FIBER CHOICE PO) Take by mouth daily. 07:30 AM    Historical Provider, MD  metoprolol tartrate (LOPRESSOR) 25 MG tablet TAKE ONE TABLET BY MOUTH TWICE DAILY 01/03/16   Wellington Hampshire, MD  polyethylene glycol (MIRALAX / GLYCOLAX) packet Take 17 g by mouth daily.    Historical Provider, MD    Allergies: No Known Allergies  Social History   Social History  . Marital status: Married    Spouse name: N/A  . Number of children: N/A  . Years of education: N/A   Occupational History  . Not on file.   Social History Main Topics  . Smoking status: Current Every Day Smoker    Packs/day: 0.50    Years: 45.00    Types: Cigarettes  . Smokeless tobacco: Never Used  . Alcohol use No     Comment: occasional  . Drug use: No  . Sexual activity: Not on file   Other Topics Concern  . Not on file   Social History Narrative  . No narrative on file     Family History  Problem Relation Age of Onset  . Stroke Father   . Alzheimer's disease Mother   . Cancer Brother   . Cancer Brother   . Cancer Sister     breast  . Breast cancer Sister 45  . Cancer Sister     breast    Review of Systems: Review of Systems  Constitutional: Positive for malaise/fatigue. Negative for chills, diaphoresis, fever and weight loss.   HENT: Negative for congestion.   Eyes: Negative for discharge and redness.  Respiratory: Negative for cough, hemoptysis, sputum production, shortness of breath and wheezing.   Cardiovascular: Negative for chest pain, palpitations, orthopnea, claudication, leg swelling and PND.  Gastrointestinal: Negative for abdominal pain, blood in stool, heartburn, melena, nausea and vomiting.  Genitourinary: Negative for hematuria.  Musculoskeletal: Negative for falls and myalgias.  Skin: Negative for rash.  Neurological: Positive for weakness. Negative for dizziness, tingling, tremors, sensory change, speech change, focal weakness and loss of consciousness.  Endo/Heme/Allergies: Does not bruise/bleed easily.  Psychiatric/Behavioral: Positive for substance abuse. The patient is not nervous/anxious.        Ongoing tobacco abuse  All other systems reviewed and are  negative.   Labs:   Lab Results  Component Value Date   WBC 17.8 (H) 06/18/2016   HGB 14.2 06/18/2016   HCT 41.2 06/18/2016   MCV 90.9 06/18/2016   PLT 239 06/18/2016     Recent Labs Lab 06/19/16 0330  NA 136  K 3.7  CL 102  CO2 26  BUN 21*  CREATININE 0.81  CALCIUM 8.9  GLUCOSE 116*   No results for input(s): CKTOTAL, CKMB, TROPONINI in the last 72 hours. Lab Results  Component Value Date   CHOL 138 06/16/2016   HDL 50 06/16/2016   LDLCALC 66 06/16/2016   TRIG 112 06/16/2016   No results found for: DDIMER  Radiology/Studies:  Dg Chest 1 View  Result Date: 06/15/2016 IMPRESSION: Increase in CHF. Electronically Signed   By: Kerby Moors M.D.   On: 06/15/2016 10:49   Ct Angio Chest Pe W And/or Wo Contrast  Result Date: 06/14/2016 IMPRESSION: No evidence of significant pulmonary embolus. Small bilateral pleural effusions with basilar atelectasis and diffuse bilateral peripheral and basilar interstitial edema. **An incidental finding of potential clinical significance has been found. Multiple focal nodular  ground-glass opacities in both lungs are nonspecific though probably represent inflammatory or infectious process. Non-contrast chest CT at 3-6 months is recommended. If nodules persist, subsequent management will be based upon the most suspicious nodule(s). This recommendation follows the consensus statement: Guidelines for Management of Incidental Pulmonary Nodules Detected on CT Images: From the Fleischner Society 2017; Radiology 2017; 284:228-243. ** Electronically Signed   By: Lucienne Capers M.D.   On: 06/14/2016 01:07   Dg Chest Port 1 View  Result Date: 06/19/2016 IMPRESSION: COPD. No pneumonia nor CHF. A trace of pleural fluid persists on the right. Thoracic aortic atherosclerosis. Electronically Signed   By: David  Martinique M.D.   On: 06/19/2016 08:51   Dg Chest Port 1 View  Result Date: 06/17/2016 IMPRESSION: Worsening bibasilar pneumonia or atelectasis with pleural effusions with the greatest changes noted on the right. Underlying COPD. No pulmonary edema. Aortic atherosclerosis. Electronically Signed   By: David  Martinique M.D.   On: 06/17/2016 07:11   Dg Chest Port 1 View  Result Date: 06/13/2016 IMPRESSION: Stable cardiac silhouette. Interstitial edema. Ill-defined right basilar opacity may represent atelectasis or pneumonia. Electronically Signed   By: Kristine Garbe M.D.   On: 06/13/2016 23:30     EKG: Interpreted by me showed: Admission EKG 11/10 with NSR, 100 bpm, LAE, nonspecific st/t changes. Repeat EKG 11/12 with sinus tachycardia, 110 bpm, rare PVC, LAE, nonspecific st/t changes Telemetry: Interpreted by me showed: currently in NSR, 90's bpm, no further episodes of SVT since 11/12  Weights: Filed Weights   06/16/16 0500 06/17/16 0448 06/18/16 0623  Weight: 128 lb 4.9 oz (58.2 kg) 118 lb 11.2 oz (53.8 kg) 115 lb 12.8 oz (52.5 kg)     Physical Exam: Blood pressure 123/65, pulse 94, temperature 98.3 F (36.8 C), temperature source Oral, resp. rate 18, height  4\' 11"  (1.499 m), weight 115 lb 12.8 oz (52.5 kg), SpO2 92 %. Body mass index is 23.39 kg/m. General: Well developed, well nourished, in no acute distress. Head: Normocephalic, atraumatic, sclera non-icteric, no xanthomas, nares are without discharge.  Neck: Negative for carotid bruits. JVD not elevated. Lungs: Clear bilaterally to auscultation without wheezes, rales, or rhonchi. Breathing is unlabored. Heart: RRR with S1 S2. III/VI systolic murmur at the apex radiating to the axilla, no rubs, or gallops appreciated. Abdomen: Soft, non-tender, non-distended with normoactive bowel  sounds. No hepatomegaly. No rebound/guarding. No obvious abdominal masses. Msk:  Strength and tone appear normal for age. Extremities: No clubbing or cyanosis. No edema. Distal pedal pulses are 2+ and equal bilaterally. Neuro: Alert and oriented X 3. No focal deficit. No facial asymmetry. Moves all extremities spontaneously. Psych:  Responds to questions appropriately with a normal affect.    ASSESSMENT AND PLAN:  Principal Problem:   Acute respiratory distress Active Problems:   Severe mitral regurgitation   SVT (supraventricular tachycardia) (HCC)   PVC (premature ventricular contraction)   Acute on chronic combined systolic and diastolic CHF (congestive heart failure) (HCC)   Normal coronary arteries   Pulmonary hypertension   Tobacco use   Constipation   Bronchitis   1. Acute respiratory distress with hypoxia: -Now resolved and on room air with stable pulse ox saturations  -Likely multifactorial including mitral valve disease, acute on chronic combined CHF, and possible bronchitis   2. Severe mitral regurgitation: -Previous echo from 2015 showed normal EF with moderate mitral valve prolapse of the posterior leaflet with moderate to severe mitral regurgitation. Recent outpatient echo from 06/05/2016 showed progressive mitral valve disease that was now severe. She was admitted prior to outpatient follow up  for acute respiratory distress with hypoxia as above -After her respiratory status improved she underwent TEE on 06/18/16 that showed severe mitral regurgitation with flailing -Right and left cardiac cath on 06/19/2016 showed normal coronary arteries and mild pulmonary hypertension -It was recommended the patient be transferred to [facility] for evaluation and treatment of her mitral valve disease  -Will need to consult TCTS for evaluation   3. PSVT/PVCs: -Much improved on Cardizem 120 mg daily as well as amiodarone  -Beta blocker was held upon admission given poor pulmonary status -Since episode of SVT on 11/12 she has remained in sinus rhythm -Continue long-acting Cardizem 120 mg daily, decrease amiodarone to 200 mg bid at this time -Continue close monitoring of electrolytes and replete as needed -Would benefit from EP evaluation, likely outpatient for possible SVT ablation, will likely need to be off amiodarone prior  4. Acute on chronic combined CHF/pulmonary hypertension: -Was briefly diuresed with IV Lasix, now on PO Lasix 40 mg daily with KCl repletion -Volume status acceptable at this time -Not on beta blocker given pulmonary status upon admission, could restart when able -Echo as above  5. Normal coronary arteries: -By cardiac cath on 06/19/2016 -Continue ASA -Not on beta blocker as above  6. Bronchitis: -Has completed ABX per IM -No longer needing nebs or steroids   7. Tobacco abuse: -Cessation advised  8. Constipation: -Continue home Miralax and Colace    Signed, Christell Faith, PA-C [facility] Pager: [phone] 06/19/2016, 10:54 AM

## 2016-06-19 NOTE — Telephone Encounter (Signed)
-----   Message from Wilhelmina Mcardle, MD sent at 06/18/2016  2:06 PM EST ----- Please schedule post hospital follow up with me in 3-4 weeks with CXR and Spirometry prior to visit  Thanks  Waunita Schooner

## 2016-06-19 NOTE — H&P (View-Only) (Signed)
Patient: Nicole Norman / Admit Date: 06/13/2016 / Date of Encounter: 06/18/2016, 7:52 AM   Subjective: No acute overnight events. She is for TEE later today. She appears euvolemic. Labs pending this morning.   Review of Systems: Review of Systems  Constitutional: Positive for malaise/fatigue. Negative for chills, diaphoresis, fever and weight loss.  HENT: Negative for congestion.   Eyes: Negative for discharge and redness.  Respiratory: Positive for shortness of breath. Negative for cough, hemoptysis, sputum production and wheezing.   Cardiovascular: Negative for chest pain, palpitations, orthopnea, claudication, leg swelling and PND.  Gastrointestinal: Negative for abdominal pain, blood in stool, heartburn, melena, nausea and vomiting.  Genitourinary: Negative for hematuria.  Musculoskeletal: Negative for falls and myalgias.  Skin: Negative for rash.  Neurological: Positive for weakness. Negative for dizziness, tingling, tremors, sensory change, speech change, focal weakness and loss of consciousness.  Endo/Heme/Allergies: Does not bruise/bleed easily.  Psychiatric/Behavioral: Negative for substance abuse. The patient is not nervous/anxious.   All other systems reviewed and are negative.   Objective: Telemetry: NSR, 90's, episodes of sinus tachycardia into the 120's to 140's bpm Physical Exam: Blood pressure 123/65, pulse 94, temperature 98.3 F (36.8 C), temperature source Oral, resp. rate 18, height 4\' 11"  (1.499 m), weight 115 lb 12.8 oz (52.5 kg), SpO2 92 %. Body mass index is 23.39 kg/m. General: Well developed, well nourished, in no acute distress. Head: Normocephalic, atraumatic, sclera non-icteric, no xanthomas, nares are without discharge. Neck: Negative for carotid bruits. JVP not elevated. Lungs: Faint crackles along bilateral bases. Breathing is unlabored. Heart: RRR S1 S2. III/VI systolic murmur at the apex radiating to the axilla, no rubs, or gallops.    Abdomen: Soft, non-tender, non-distended with normoactive bowel sounds. No rebound/guarding. Extremities: No clubbing or cyanosis. No edema. Distal pedal pulses are 2+ and equal bilaterally. Neuro: Alert and oriented X 3. Moves all extremities spontaneously. Psych:  Responds to questions appropriately with a normal affect.   Intake/Output Summary (Last 24 hours) at 06/18/16 0752 Last data filed at 06/18/16 0500  Gross per 24 hour  Intake              360 ml  Output             1850 ml  Net            -1490 ml    Inpatient Medications:  . amiodarone  400 mg Oral BID  . aspirin EC  81 mg Oral Daily  . budesonide (PULMICORT) nebulizer solution  0.5 mg Nebulization BID  . calcium-vitamin D  1 tablet Oral Daily  . cholecalciferol  2,000 Units Oral Daily  . diltiazem  120 mg Oral Daily  . docusate sodium  100 mg Oral BID  . doxycycline  100 mg Oral Q12H  . furosemide  40 mg Oral Daily  . ipratropium-albuterol  3 mL Nebulization TID  . potassium chloride  20 mEq Oral Daily  . sodium chloride flush  3 mL Intravenous Q12H   Infusions:  . sodium chloride 20 mL/hr at 06/18/16 [id number]    Labs:  Recent Labs  06/16/16 0410 06/17/16 0518  NA 140 138  K 3.4* 4.0  CL 109 103  CO2 25 26  GLUCOSE 108* 108*  BUN 12 15  CREATININE 0.69 0.83  CALCIUM 8.7* 9.4   No results for input(s): AST, ALT, ALKPHOS, BILITOT, PROT, ALBUMIN in the last 72 hours.  Recent Labs  06/16/16 0410 06/17/16 0518  WBC 22.4* 17.9*  HGB 11.5* 12.9  HCT 34.4* 37.8  MCV 92.1 91.6  PLT 197 208   No results for input(s): CKTOTAL, CKMB, TROPONINI in the last 72 hours. Invalid input(s): POCBNP No results for input(s): HGBA1C in the last 72 hours.   Weights: Filed Weights   06/16/16 0500 06/17/16 0448 06/18/16 0623  Weight: 128 lb 4.9 oz (58.2 kg) 118 lb 11.2 oz (53.8 kg) 115 lb 12.8 oz (52.5 kg)     Radiology/Studies:  Dg Chest 1 View  Result Date: 06/15/2016 CLINICAL DATA:  Dyspnea.  History of  dysrhythmia. EXAM: CHEST 1 VIEW COMPARISON:  06/13/2016 FINDINGS: Normal heart size. Small right pleural effusion. Increased from previous exam. Mild diffuse pulmonary edema, increased from previous exam. IMPRESSION: Increase in CHF. Electronically Signed   By: Kerby Moors M.D.   On: 06/15/2016 10:49   Ct Angio Chest Pe W And/or Wo Contrast  Result Date: 06/14/2016 CLINICAL DATA:  Respiratory distress.  D-dimer 987 EXAM: CT ANGIOGRAPHY CHEST WITH CONTRAST TECHNIQUE: Multidetector CT imaging of the chest was performed using the standard protocol during bolus administration of intravenous contrast. Multiplanar CT image reconstructions and MIPs were obtained to evaluate the vascular anatomy. CONTRAST:  75 mL Isovue 370 COMPARISON:  11/02/2007 FINDINGS: Cardiovascular: Satisfactory opacification of the pulmonary arteries to the segmental level. No evidence of pulmonary embolism. Normal heart size. No pericardial effusion. Aortic atherosclerosis. Mediastinum/Nodes: No enlarged mediastinal, hilar, or axillary lymph nodes. Thyroid gland, trachea, and esophagus demonstrate no significant findings. Lungs/Pleura: Small bilateral pleural effusions with basilar atelectasis. Interstitial prominence in the lung bases and lung periphery consistent with interstitial edema. Multiple focal nodular ground-glass infiltrates demonstrated in both lungs, likely representing inflammatory process. Largest is in the right upper lung and measures about 12 mm diameter. Airways are patent. No pneumothorax. Upper Abdomen: Small cyst in the lateral segment left lobe of the liver are unchanged since previous study. Surgical absence of the gallbladder. Musculoskeletal: No chest wall abnormality. No acute or significant osseous findings. Review of the MIP images confirms the above findings. IMPRESSION: No evidence of significant pulmonary embolus. Small bilateral pleural effusions with basilar atelectasis and diffuse bilateral peripheral and  basilar interstitial edema. **An incidental finding of potential clinical significance has been found. Multiple focal nodular ground-glass opacities in both lungs are nonspecific though probably represent inflammatory or infectious process. Non-contrast chest CT at 3-6 months is recommended. If nodules persist, subsequent management will be based upon the most suspicious nodule(s). This recommendation follows the consensus statement: Guidelines for Management of Incidental Pulmonary Nodules Detected on CT Images: From the Fleischner Society 2017; Radiology 2017; 284:228-243. ** Electronically Signed   By: Lucienne Capers M.D.   On: 06/14/2016 01:07   Dg Chest Port 1 View  Result Date: 06/17/2016 CLINICAL DATA:  Respiratory failure coronary artery disease, current smoker. EXAM: PORTABLE CHEST 1 VIEW COMPARISON:  Portable chest x-ray of June 15, 2016 FINDINGS: The lungs are well-expanded. Increased density in the right lower lobe has developed since a previous study. Increased volume of the right pleural effusion has occurred. On the left mild lower lobe atelectatic changes have increased and the small left pleural effusion has increased but is less prominent than that on the right. The heart is top-normal in size. The pulmonary vascularity is not engorged. There is calcification in the wall of the aortic arch The mediastinum is normal in width. The bony thorax exhibits no acute abnormality. IMPRESSION: Worsening bibasilar pneumonia or atelectasis with pleural effusions with the greatest changes noted on  the right. Underlying COPD. No pulmonary edema. Aortic atherosclerosis. Electronically Signed   By: David  Martinique M.D.   On: 06/17/2016 07:11   Dg Chest Port 1 View  Result Date: 06/13/2016 CLINICAL DATA:  70 y/o F; respiratory distress and feeling she could not breathe. EXAM: PORTABLE CHEST 1 VIEW COMPARISON:  07/15/2013 chest radiograph. FINDINGS: The stable cardiomediastinal silhouette within normal  limits given projection and technique. Increased interstitial markings probably represents interstitial edema. Ill-defined opacity right lung base may represent atelectasis or pneumonia. No acute osseous abnormality is evident. IMPRESSION: Stable cardiac silhouette. Interstitial edema. Ill-defined right basilar opacity may represent atelectasis or pneumonia. Electronically Signed   By: Kristine Garbe M.D.   On: 06/13/2016 23:30     Assessment and Plan  Active Problems:   Acute respiratory failure with hypoxia (HCC)   SVT (supraventricular tachycardia) (HCC)   Aortic atherosclerosis (HCC)   Coronary artery disease involving native coronary artery of native heart without angina pectoris   Smoker    1. Acute respiratory distress with hypoxia: -Improved, now on room air -Ambulated on 11/14 without issues -Nebs, ABX, and steroids per IM  2. PSVT/PVCs: -Currently in NSR with heart rates in the 90's bpm with episodes of sinus tachycardia with heart rates into the 120's to 140's bpm, possibly with ambulation in the hallways -Continue PO amiodarone 400 mg bid for 1 week total, then transition to 200 mg bid x 1 week, then 200 mg daily thereafter for suppression of PVCs -Unable to use beta blocker given hypotension  -Remains on Cardizem CD 120 mg daily -Potassium repleted, bmet pending this morning -Plan for outpatient EP evaluation for SVT ablation   3. Mitral regurgitation: -Plan for TEE on 11/15 with Dr. Saunders Revel to better evaluate degree of valvular heart disease and its role in her symptoms given changes noted on echo (scheduled for 06/18/16) -NPO after midnight  4. 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 CAD: -No symptoms concerning for angina -Not on beta blocker as above -ASA  Signed, Christell Faith, PA-C [facility]  [facility] Pager: [phone] 06/18/2016, 7:52 AM

## 2016-06-19 NOTE — Progress Notes (Signed)
1 cc of air removed from right TR band. Site without bleeding.

## 2016-06-19 NOTE — Progress Notes (Signed)
Pt remains clinically stable post heart cath, all air  Has been removed from tr band, and after 22min's no bleeding at site, unremarkable, husband present, sr per monitor, vitals remain stable dressing placed to site, no visible bleeding at this time. Denies complaints at this time, right groin without bleeding nor hematoma, with dressing intact, has had sips sprite without difficulty. Plan to transfer pt back to room as she remains stable to await transfer to [facility] for future CABG AND VALVE SURGERY. Report given to care nurse with plan reviewed.

## 2016-06-19 NOTE — Discharge Summary (Signed)
[place] [facility] [place] NAME: Nicole Norman    MR#:  [id number]  DATE OF BIRTH:  Jul 15, 1946  DATE OF ADMISSION:  06/13/2016 ADMITTING PHYSICIAN: Harrie Foreman, MD  DATE OF DISCHARGE: 06/19/2016 to [facility]  PRIMARY CARE PHYSICIAN: Tracie Harrier, MD    ADMISSION DIAGNOSIS:  Acute respiratory failure, unspecified whether with hypoxia or hypercapnia ([place]) [J96.00] Multifocal pneumonia [J18.9]  DISCHARGE DIAGNOSIS:  Active Problems:   Acute respiratory failure with hypoxia (HCC)   SVT (supraventricular tachycardia) (HCC)   Aortic atherosclerosis (HCC)   Coronary artery disease involving native coronary artery of native heart without angina pectoris   Smoker   SECONDARY DIAGNOSIS:   Past Medical History:  Diagnosis Date  . Migraine    1x/month - uses Aleve  . Normal coronary arteries    a. cardiac cath 06/19/2016: normal coronary arteries and left dominant system, LVEDP nl, 4+ mitral regurgitation, RHC showed mildly elevated LVEDP (3mmHg), giant V waves noted on PCWP w/ mild pulmonary HTN., pulmonary pressure 39/10 with a mean of 27 mmHg, mildly reduced cardiac output at 3.1 with a cardiac index of 2.09, recommned mitral valve repair, acceptable volume status  . Paroxysmal SVT (supraventricular tachycardia) ([place])   . PVC's (premature ventricular contractions)    a. Holter 2015 with 26,000 PVCs in 48 hours  . Severe mitral regurgitation    a. TTE 06/05/16: EF 60-65%, nl WM, LV dias fxn nl, mod prolapse of posterior mitral leaflet with mod eccentric regurg, LA mildly dilated, PASP nl; b. TEE 06/18/16: EF 65-70%, LV mildly dilated, no valvular vegetations seen throughout, mitral valve w/ flail motion involving the middle scallop of the post leaflet. Severe regurgitation directed eccentrically, LA mildly dilated  . Shortness of breath dyspnea    1 flight stairs - winded  . Tobacco abuse   . Vertigo    no meds     HOSPITAL COURSE:   1. Acute hypoxic respiratory failure. Patient required BiPAP on presentation and then was tapered off the BiPAP to room air then worsened requiring high flow nasal cannula and now back on room air. 2. Supraventricular tachycardia. Amiodarone needed to be started and Cardizem added for heart rate control. Patient currently in normal sinus rhythm. 3. Acute diastolic congestive heart failure with severe mitral regurgitation. Patient improved after starting IV Lasix with regards to her respiratory status. Slight wheeze in the bottom of her lungs today after cardiac catheterization. Dr. Fletcher Anon cardiology decided to transfer the patient to [facility] for mitral valve repair or replacement. They set up transfer. Medications can be adjusted based on her course at [facility]. 4. Tobacco abuse. Patient declined nicotine patch. She understands that she must stop smoking. 5. COPD. Initially this was thought to be COPD exacerbation the patient was given steroids and nebulizer treatments. On the patient's respiratory status worsened the second time it was likely all secondary to heart failure rather than COPD or pneumonia. Patient's antibiotics were stopped. Patient will follow-up with Dr. Alva Garnet pulmonary as outpatient for PFT testing. 6. Groundglass opacities which initially was thought to be pneumonia and the patient was given antibiotics.  The patient's respiratory status worsened the second time and the patient improved with Lasix.  The groundglass or secondary to CHF and not pneumonia and antibiotics were stopped.    DISCHARGE CONDITIONS:   Satisfactory   CONSULTS OBTAINED:  Treatment Team:  Minna Merritts, MD  DRUG ALLERGIES:  No Known Allergies  DISCHARGE MEDICATIONS:  Current Discharge Medication List    START taking these medications   Details  acetaminophen (TYLENOL) 325 MG tablet Take 2 tablets (650 mg total) by mouth every 6 (six) hours as needed for  mild pain (or Fever >/= 101).    albuterol (PROVENTIL) (2.5 MG/3ML) 0.083% nebulizer solution Inhale 3 mLs (2.5 mg total) into the lungs every 4 (four) hours as needed for wheezing or shortness of breath. Qty: 75 mL, Refills: 12    amiodarone (PACERONE) 200 MG tablet Take 1 tablet (200 mg total) by mouth 2 (two) times daily.    diltiazem (CARDIZEM CD) 120 MG 24 hr capsule Take 1 capsule (120 mg total) by mouth daily.    furosemide (LASIX) 40 MG tablet Take 1 tablet (40 mg total) by mouth daily. Qty: 30 tablet    potassium chloride SA (K-DUR,KLOR-CON) 20 MEQ tablet Take 1 tablet (20 mEq total) by mouth daily.    tiotropium (SPIRIVA) 18 MCG inhalation capsule Place 1 capsule (18 mcg total) into inhaler and inhale daily. Qty: 30 capsule, Refills: 12      CONTINUE these medications which have NOT CHANGED   Details  aspirin 81 MG tablet Take 81 mg by mouth daily. AM    Calcium Carbonate-Vitamin D (CALCIUM-VITAMIN D) 500-200 MG-UNIT per tablet Take 1 tablet by mouth daily. 10 AM    Cholecalciferol (VITAMIN D3) 2000 UNITS TABS Take 1 tablet by mouth daily. 10 AM    polyethylene glycol (MIRALAX / GLYCOLAX) packet Take 17 g by mouth daily.      STOP taking these medications     FIBER SELECT GUMMIES PO      Inulin (FIBER CHOICE PO)      metoprolol tartrate (LOPRESSOR) 25 MG tablet          DISCHARGE INSTRUCTIONS:   Patient will follow-up at [facility] for evaluation for mitral valve repair or replacement.  If you experience worsening of your admission symptoms, develop shortness of breath, life threatening emergency, suicidal or homicidal thoughts you must seek medical attention immediately by calling 911 or calling your MD immediately  if symptoms less severe.  You Must read complete instructions/literature along with all the possible adverse reactions/side effects for all the Medicines you take and that have been prescribed to you. Take any new Medicines after you have  completely understood and accept all the possible adverse reactions/side effects.   Please note  You were cared for by a hospitalist during your hospital stay. If you have any questions about your discharge medications or the care you received while you were in the hospital after you are discharged, you can call the unit and asked to speak with the hospitalist on call if the hospitalist that took care of you is not available. Once you are discharged, your primary care physician will handle any further medical issues. Please note that NO REFILLS for any discharge medications will be authorized once you are discharged, as it is imperative that you return to your primary care physician (or establish a relationship with a primary care physician if you do not have one) for your aftercare needs so that they can reassess your need for medications and monitor your lab values.    Today   CHIEF COMPLAINT:   Chief Complaint  Patient presents with  . Respiratory Distress    HISTORY OF PRESENT ILLNESS:  Nicole Norman  is a 70 y.o. female presented with respiratory distress and initially thought to have pneumonia    VITAL  SIGNS:  Blood pressure 112/70, pulse 83, temperature 98.6 F (37 C), temperature source Oral, resp. rate 18, height 4\' 11"  (1.499 m), weight 54.4 kg (120 lb), SpO2 93 %.    PHYSICAL EXAMINATION:  GENERAL:  70 y.o.-year-old patient lying in the bed with no acute distress.  EYES: Pupils equal, round, reactive to light and accommodation. No scleral icterus. Extraocular muscles intact.  HEENT: Head atraumatic, normocephalic. Oropharynx and nasopharynx clear.  NECK:  Supple, no jugular venous distention. No thyroid enlargement, no tenderness.  LUNGS: Normal breath sounds bilaterally, slight  wheezingab bilateral bases.  no rales,rhonchi or crepitation. No use of accessory muscles of respiration.  CARDIOVASCULAR: S1, S2 normal. 4 and a 6 systolic  Murmurs. No  rubs, or gallops.   ABDOMEN: Soft, non-tender, non-distended. Bowel sounds present. No organomegaly or mass.  EXTREMITIES: No pedal edema, cyanosis, or clubbing.  NEUROLOGIC: Cranial nerves II through XII are intact. Muscle strength 5/5 in all extremities. Sensation intact. Gait not checked.  PSYCHIATRIC: The patient is alert and oriented x 3.  SKIN: No obvious rash, lesion, or ulcer.   DATA REVIEW:   CBC  Recent Labs Lab 06/18/16 0811  WBC 17.8*  HGB 14.2  HCT 41.2  PLT 239    Chemistries   Recent Labs Lab 06/19/16 0330  NA 136  K 3.7  CL 102  CO2 26  GLUCOSE 116*  BUN 21*  CREATININE 0.81  CALCIUM 8.9  MG 1.9    Cardiac Enzymes  Recent Labs Lab 06/14/16 1532  TROPONINI 0.08*    Microbiology Results  Results for orders placed or performed during the hospital encounter of 06/13/16  Blood Culture (routine x 2)     Status: None   Collection Time: 06/13/16 11:42 PM  Result Value Ref Range Status   Specimen Description BLOOD LEFT HAND  Final   Special Requests BOTTLES DRAWN AEROBIC AND ANAEROBIC 3CC  Final   Culture NO GROWTH 5 DAYS  Final   Report Status 06/18/2016 FINAL  Final  Blood Culture (routine x 2)     Status: None   Collection Time: 06/13/16 11:42 PM  Result Value Ref Range Status   Specimen Description BLOOD LEFT FOREARM  Final   Special Requests BOTTLES DRAWN AEROBIC AND ANAEROBIC 3CC  Final   Culture NO GROWTH 5 DAYS  Final   Report Status 06/18/2016 FINAL  Final  MRSA PCR Screening     Status: None   Collection Time: 06/14/16  3:31 AM  Result Value Ref Range Status   MRSA by PCR NEGATIVE NEGATIVE Final    Comment:        The GeneXpert MRSA Assay (FDA approved for NASAL specimens only), is one component of a comprehensive MRSA colonization surveillance program. It is not intended to diagnose MRSA infection nor to guide or monitor treatment for MRSA infections.     RADIOLOGY:  Dg Chest Port 1 View  Result Date: 06/19/2016 CLINICAL DATA:   Respiratory failure, cardiac dysrhythmia coronary artery disease, mitral regurgitation. Current smoker. EXAM: PORTABLE CHEST 1 VIEW COMPARISON:  Portable chest x-ray of June 17, 2016 FINDINGS: The lungs are hyperinflated with hemidiaphragm flattening. There is no focal infiltrate. There is has been resolution of the pleural effusion on the right with only a trace of fluid remaining. The heart and pulmonary vascularity are normal. There is calcification in the wall of the aortic arch. IMPRESSION: COPD. No pneumonia nor CHF. A trace of pleural fluid persists on the right. Thoracic aortic atherosclerosis. Electronically  Signed   By: David  Martinique M.D.   On: 06/19/2016 08:51    Management plans discussed with the patient, family and they are in agreement.  CODE STATUS:     Code Status Orders        Start     Ordered   06/14/16 0330  Full code  Continuous     06/14/16 0329    Code Status History    Date Active Date Inactive Code Status Order ID Comments User Context   This patient has a current code status but no historical code status.    Advance Directive Documentation   [place] Most Recent Value  Type of Advance Directive  Living will  Pre-existing out of facility DNR order (yellow form or pink MOST form)  No data  "MOST" Form in Place?  No data      TOTAL TIME TAKING CARE OF THIS PATIENT: 35 minutes.    Loletha Grayer M.D on 06/19/2016 at 11:42 AM  Between 7am to 6pm - Pager - [phone]  After 6pm go to [email or web address] - password [email or web address]  [facility] Office  [phone]  CC: Primary care physician; Tracie Harrier, MD

## 2016-06-20 ENCOUNTER — Other Ambulatory Visit: Payer: Self-pay | Admitting: *Deleted

## 2016-06-20 ENCOUNTER — Encounter (HOSPITAL_COMMUNITY): Payer: Self-pay | Admitting: Thoracic Surgery (Cardiothoracic Vascular Surgery)

## 2016-06-20 ENCOUNTER — Inpatient Hospital Stay (HOSPITAL_COMMUNITY): Payer: PPO

## 2016-06-20 ENCOUNTER — Ambulatory Visit (HOSPITAL_COMMUNITY): Payer: PPO

## 2016-06-20 DIAGNOSIS — Z01810 Encounter for preprocedural cardiovascular examination: Secondary | ICD-10-CM

## 2016-06-20 DIAGNOSIS — J9601 Acute respiratory failure with hypoxia: Secondary | ICD-10-CM

## 2016-06-20 DIAGNOSIS — I700 Atherosclerosis of aorta: Secondary | ICD-10-CM | POA: Diagnosis not present

## 2016-06-20 DIAGNOSIS — I5033 Acute on chronic diastolic (congestive) heart failure: Secondary | ICD-10-CM

## 2016-06-20 DIAGNOSIS — I340 Nonrheumatic mitral (valve) insufficiency: Secondary | ICD-10-CM

## 2016-06-20 DIAGNOSIS — Z720 Tobacco use: Secondary | ICD-10-CM

## 2016-06-20 DIAGNOSIS — I471 Supraventricular tachycardia: Secondary | ICD-10-CM

## 2016-06-20 DIAGNOSIS — J449 Chronic obstructive pulmonary disease, unspecified: Secondary | ICD-10-CM | POA: Diagnosis not present

## 2016-06-20 DIAGNOSIS — R0603 Acute respiratory distress: Secondary | ICD-10-CM | POA: Diagnosis not present

## 2016-06-20 DIAGNOSIS — I493 Ventricular premature depolarization: Secondary | ICD-10-CM | POA: Diagnosis not present

## 2016-06-20 DIAGNOSIS — I774 Celiac artery compression syndrome: Secondary | ICD-10-CM | POA: Diagnosis not present

## 2016-06-20 LAB — COMPREHENSIVE METABOLIC PANEL
ALBUMIN: 3.1 g/dL — AB (ref 3.5–5.0)
ALT: 16 U/L (ref 14–54)
ANION GAP: 8 (ref 5–15)
AST: 14 U/L — ABNORMAL LOW (ref 15–41)
Alkaline Phosphatase: 61 U/L (ref 38–126)
BUN: 14 mg/dL (ref 6–20)
CHLORIDE: 105 mmol/L (ref 101–111)
CO2: 26 mmol/L (ref 22–32)
Calcium: 8.8 mg/dL — ABNORMAL LOW (ref 8.9–10.3)
Creatinine, Ser: 0.88 mg/dL (ref 0.44–1.00)
GFR calc non Af Amer: >60 mL/min (ref >60)
GLUCOSE: 106 mg/dL — AB (ref 65–99)
Potassium: 3.6 mmol/L (ref 3.5–5.1)
SODIUM: 139 mmol/L (ref 135–145)
Total Bilirubin: 0.3 mg/dL (ref 0.3–1.2)
Total Protein: 5.7 g/dL — ABNORMAL LOW (ref 6.5–8.1)

## 2016-06-20 LAB — VAS US DOPPLER PRE CABG
LCCADDIAS: -33 cm/s
LCCAPSYS: 106 cm/s
LEFT ECA DIAS: -29 cm/s
LEFT VERTEBRAL DIAS: 38 cm/s
Left CCA dist sys: -106 cm/s
Left CCA prox dias: 30 cm/s
Left ICA dist dias: -31 cm/s
Left ICA dist sys: -90 cm/s
Left ICA prox dias: -34 cm/s
Left ICA prox sys: -96 cm/s
RCCADSYS: -107 cm/s
RCCAPDIAS: 21 cm/s
RIGHT ECA DIAS: -20 cm/s
RIGHT VERTEBRAL DIAS: 15 cm/s
Right CCA prox sys: 71 cm/s

## 2016-06-20 MED ORDER — POTASSIUM CHLORIDE CRYS ER 20 MEQ PO TBCR: 20.0000 meq | EXTENDED_RELEASE_TABLET | Freq: Every day | ORAL | 1 refills | Status: DC

## 2016-06-20 MED ORDER — DILTIAZEM HCL ER COATED BEADS 120 MG PO CP24: 120.0000 mg | ORAL_CAPSULE | Freq: Every day | ORAL | 1 refills | Status: DC

## 2016-06-20 MED ORDER — FUROSEMIDE 40 MG PO TABS: 40.0000 mg | ORAL_TABLET | Freq: Every day | ORAL | Status: DC

## 2016-06-20 MED ORDER — AMIODARONE HCL 200 MG PO TABS
200.0000 mg | ORAL_TABLET | Freq: Two times a day (BID) | ORAL | Status: DC
  Filled 2016-06-20: qty 1

## 2016-06-20 MED ORDER — IOPAMIDOL (ISOVUE-370) INJECTION 76%
INTRAVENOUS | Status: AC
  Filled 2016-06-20: qty 100

## 2016-06-20 MED ORDER — IOPAMIDOL (ISOVUE-370) INJECTION 76%
100.0000 mL | Freq: Once | INTRAVENOUS | Status: AC | PRN
  Administered 2016-06-20: 100 mL via INTRAVENOUS

## 2016-06-20 MED ORDER — AMIODARONE HCL 200 MG PO TABS: 200.0000 mg | ORAL_TABLET | Freq: Two times a day (BID) | ORAL | 1 refills | Status: DC

## 2016-06-20 MED ORDER — FUROSEMIDE 40 MG PO TABS: 40.0000 mg | ORAL_TABLET | Freq: Every day | ORAL | 1 refills | Status: DC

## 2016-06-20 NOTE — Progress Notes (Addendum)
Pre-op Cardiac Surgery  Carotid Findings:  Bilateral: No significant (1-39%) ICA stenosis. Antegrade vertebral flow.    Upper Extremity Right Left  Brachial Pressures N/A restricted arm 127  Radial Waveforms Tri Tri  Ulnar Waveforms Tri Tri  Palmar Arch (Allen's Test) Decreases >50% with radial compression, normal with ulnar compression Normal     Landry Mellow, RDMS, RVT 06/20/2016

## 2016-06-20 NOTE — Progress Notes (Signed)
Patient Name: Nicole Norman Date of Encounter: 06/20/2016  Primary Cardiologist: [facility] Problem List     Principal Problem:   Acute respiratory distress Active Problems:   PVC (premature ventricular contraction)   Severe mitral regurgitation   Tobacco use   Constipation   SVT (supraventricular tachycardia) (HCC)   Acute on chronic combined systolic and diastolic CHF (congestive heart failure) (HCC)   Normal coronary arteries   Pulmonary hypertension   Bronchitis     Subjective   Feels great, breathing back to normal. Able to lie fully supine without dyspnea.   Inpatient Medications    Scheduled Meds: . amiodarone  200 mg Oral BID PC  . aspirin EC  81 mg Oral Daily  . [START ON 06/21/2016] calcium-vitamin D  1 tablet Oral Q breakfast  . cholecalciferol  2,000 Units Oral Daily  . diltiazem  120 mg Oral Daily  . docusate sodium  100 mg Oral BID  . enoxaparin (LOVENOX) injection  40 mg Subcutaneous Q24H  . furosemide  40 mg Oral Daily  . polyethylene glycol  17 g Oral Daily  . potassium chloride  20 mEq Oral Daily  . sodium chloride flush  3 mL Intravenous Q12H  . tiotropium  18 mcg Inhalation Daily   Continuous Infusions:  PRN Meds: sodium chloride, acetaminophen, ondansetron (ZOFRAN) IV, sodium chloride, sodium chloride flush   Vital Signs    Vitals:   06/19/16 2044 06/20/16 0500 06/20/16 0845 06/20/16 0936  BP: (!) 115/49 (!) 116/57 (!) 110/53   Pulse: 86 84 74 76  Resp: 18 16  18   Temp: 98.4 F (36.9 C) 98.1 F (36.7 C)    TempSrc: Oral Oral    SpO2:  97%  96%  Weight:  116 lb 9.6 oz (52.9 kg)    Height:        Intake/Output Summary (Last 24 hours) at 06/20/16 1038 Last data filed at 06/19/16 2047  Gross per 24 hour  Intake              240 ml  Output              150 ml  Net               90 ml   Filed Weights   06/19/16 1826 06/20/16 0500  Weight: 117 lb 4.6 oz (53.2 kg) 116 lb 9.6 oz (52.9 kg)    Physical Exam   Smiling, comfortable GEN: Well nourished, well developed, in no acute distress.  HEENT: Grossly normal.  Neck: Supple, no JVD, bilateral carotid bruits, or masses. Cardiac: RRR, 3/6 apical holosystolic murmur with broad radiation throughout chest, no diastolic murmurs, rubs, or gallops. No clubbing, cyanosis, edema.  Radials/DP/PT 2+ and equal bilaterally.  Respiratory:  Respirations regular and unlabored, clear to auscultation bilaterally. GI: Soft, nontender, nondistended, BS + x 4. MS: no deformity or atrophy. Skin: warm and dry, no rash. Neuro:  Strength and sensation are intact. Psych: AAOx3.  Normal affect.  Labs    CBC  Recent Labs  06/18/16 0811 06/19/16 1920  WBC 17.8* 11.9*  NEUTROABS  --  7.7  HGB 14.2 11.9*  HCT 41.2 35.7*  MCV 90.9 91.5  PLT 239 [id number]   Basic Metabolic Panel  Recent Labs  06/19/16 0330 06/19/16 1920 06/20/16 0159  NA 136  --  139  K 3.7  --  3.6  CL 102  --  105  CO2 26  --  26  GLUCOSE 116*  --  106*  BUN 21*  --  14  CREATININE 0.81 0.97 0.88  CALCIUM 8.9  --  8.8*  MG 1.9  --   --    Liver Function Tests  Recent Labs  06/20/16 0159  AST 14*  ALT 16  ALKPHOS 61  BILITOT 0.3  PROT 5.7*  ALBUMIN 3.1*     Telemetry    NSR - Personally Reviewed  ECG    S Tachy, rare PVC, nonspecific ST changes - Personally Reviewed  Radiology    Dg Chest Port 1 View  Result Date: 06/19/2016 CLINICAL DATA:  Respiratory failure, cardiac dysrhythmia coronary artery disease, mitral regurgitation. Current smoker. EXAM: PORTABLE CHEST 1 VIEW COMPARISON:  Portable chest x-ray of June 17, 2016 FINDINGS: The lungs are hyperinflated with hemidiaphragm flattening. There is no focal infiltrate. There is has been resolution of the pleural effusion on the right with only a trace of fluid remaining. The heart and pulmonary vascularity are normal. There is calcification in the wall of the aortic arch. IMPRESSION: COPD. No pneumonia nor CHF. A trace  of pleural fluid persists on the right. Thoracic aortic atherosclerosis. Electronically Signed   By: David  Martinique M.D.   On: 06/19/2016 08:51    Cardiac Studies   06/19/16 cath  The left ventricular systolic function is normal.  LV end diastolic pressure is normal.  There is severe (4+) mitral regurgitation.   1. Normal coronary arteries and a left dominant system. 2. Normal LV systolic function with an ejection fraction of 55%. 3. Severe mitral regurgitation with severely dilated left atrium. 4. Right heart catheterization showed mildly elevated left ventricular end-diastolic pressure (14 mmHg), giant V waves noted on pulmonary capillary wedge pressure with mild pulmonary hypertension. Pulmonary pressure was 39/10 with a mean of 27 mmHg. Mildly reduced cardiac output at 3.1 with a cardiac index of 2.09.  06/19/16 TEE  - Left ventricle: The cavity size was mildly dilated. Systolic function was vigorous. The estimated ejection fraction was in the range of 65% to 70%. - Aortic valve: No evidence of vegetation. - Mitral valve: Flail motion involving the middle scallop of the posterior leaflet. There was severe regurgitation directed eccentrically. Severe regurgitation is suggested by pulmonary vein systolic flow reversal and a vena contracta width >= 6 mm. - Left atrium: The atrium was dilated. No evidence of thrombus in the atrial cavity or appendage.  Patient Profile     70 year old with severe chronic mitral insufficiency and recent development of symptoms and signs of volume overload and SVT, apparently exacerbated by acute respiratory infection in a long-time smoker/probable COPD.  Assessment & Plan    Good response to diuresis. Clinically euvolemic. Preserved LVEF. Evaluated for surgery by Dr. Roxy Manns. Good prognosis for successful surgical repair, planned via less invasive lateral thoracotomy. Procedure scheduled for 07/02/16. This will allow time for some improvement in lung  function after smoking cessation/antibiotics/steroids and for amiodarone "loading" to reduce risk of postop atrial fibrillation. Plan carotid duplex and PFTs today, DC afterwards. Low sodium diet. Plans discussed with Dr. Roxy Manns and Dr. Fletcher Anon.  Signed, Sanda Klein, MD  06/20/2016, 10:38 AM

## 2016-06-20 NOTE — Discharge Instructions (Signed)
Call [facility] at [phone] if any bleeding, swelling or drainage at cath site.  May shower, no tub baths for 48 hours for groin sticks. No lifting over 5 pounds for 3 days.  No Driving for 3 days  Weigh daily Call [phone] if weight climbs more than 3 pounds in a day or 5 pounds in a week. No salt to very little salt in your diet.  No more than 2000 mg in a day. Call if increased shortness of breath or increased swelling.  IF ANY INCREASE SOB call Dr. Tyrell Antonio office if moderate to severe go to ER.  STop METOPROLOL   You are scheduled for Pulmonary Functions Studies  (PFTs) on Monday go to admitting at [facility] and they will check you in and direct you to correct place.  Be here at 1045 AM this AM   check with Dr. Roxy Manns about Pulmonary follow up though it may be beneficial.

## 2016-06-20 NOTE — Consult Note (Signed)
[address]       [place] [address]             [phone]          CARDIOTHORACIC SURGERY CONSULTATION REPORT  PCP is HANDE,VISHWANATH, MD Referring Provider is ARIDA, Mertie Clause, MD  Reason for consultation:  Severe mitral regurgitation  HPI:  Patient is a 70 year old female with history of mitral valve prolapse causing mitral regurgitation, palpitations with frequent PVCs, long-standing symptoms of exertional shortness of breath, and long-standing tobacco abuse with COPD who was transferred from [facility] to [facility] for evaluation of severe mitral regurgitation.  The patient states that she first began to experience symptoms of exertional shortness of breath and palpitations nearly 5 years ago. She was noted to have mitral valve prolapse and initially was evaluated by Dr. Ubaldo Glassing.  Symptoms progressed and she was referred to Dr. Fletcher Anon who has been following her since December 2015.  At that time the patient complained of significant exertional shortness of breath and frequent palpitations. Echocardiogram performed at that time revealed normal left ventricular systolic function with mitral valve prolapse and "moderate to severe" mitral regurgitation.  A nuclear stress test was performed and felt to be low risk for myocardial ischemia. She was started on metoprolol and symptoms of palpitations improved. The patient has been followed intermittently ever since by Dr. Fletcher Anon.  A Holter monitor was performed documenting frequent PVCs. The patient states that over the past year she has developed further progression of symptoms of exertional shortness of breath and fatigue with increasing episodes of palpitations.  She was seen in follow-up recently by Dr. Fletcher Anon and a follow-up echocardiogram was performed 06/05/2016. This revealed normal left ventricular size and systolic function with posterior leaflet prolapse and "moderate eccentric regurgitation". Arrangements  for were made for a repeat Holter monitor because of worsening symptoms of palpitations.    The patient states that symptoms of shortness of breath continued to progress to the point where she began to experience trouble breathing even at rest. She presented acutely to the emergency department at [facility] on 06/14/2016 with resting shortness of breath with baseline oxygen saturation was 88% on room air.  She also complained of cough and baseline white blood count was elevated at 20,400. The patient was admitted to the hospital and placed on BiPAP with symptomatic improvement. CT scan of the chest was performed to rule out pulmonary embolism but was notable for the presence of interstitial edema and groundglass opacities suspicious for possible pneumonia.  She was treated with intravenous antibiotics, steroids and nebulizers for presumed community acquired pneumonia and bronchitis.  Following admission she had a brief episode of supraventricular tachycardia which converted to sinus rhythm following adenosine administration.  Cardiology consultation was obtained and the patient was started on diuretic therapy.  The patient gradually improved over the next several days. She underwent transesophageal echocardiogram 06/18/2016 confirmed the presence of mitral valve prolapse with flail segment of posterior leaflet and severe mitral regurgitation.  Left and right heart catheterization was performed 06/19/2016 and notable for the absence of significant coronary artery disease with severe mitral regurgitation and mildly elevated pulmonary artery pressures. The patient was transferred to [facility] for further management.  The patient is married and lives with her husband in [place], [place]. She works full-time as a Statistician. She has a long-standing history of tobacco abuse smoking greater than 1 pack of cigarettes daily for many years,  more recently smoking half pack of cigarettes daily. She describes  a several year history of progressive symptoms of exertional shortness of breath and fatigue. Symptoms have reportedly got much worse over the past year. Onset of symptoms has been slow and gradual without any recent acute changes. Prior to hospital admission she reports symptoms of orthopnea with frequent palpitations. She has not had lower extremity edema. She gets occasional episodes of tightness across her chest typically in the setting of more severe shortness of breath and palpitations. She has not had dizzy spells or syncope. Since hospital admission nearly 1 week ago the patient's breathing has improved dramatically and she now reports feeling comfortable breathing on room air. She denies any fevers, chills, or productive cough. Appetite is fair. Remainder for review of systems is noncontributory.    Past Medical History:  Diagnosis Date  . Incidental pulmonary nodule 06/13/2016   Multiple small ground glass opacities seen on chest CT scan  . Migraine    1x/month - uses Aleve  . Normal coronary arteries    a. cardiac cath 06/19/2016: normal coronary arteries and left dominant system, LVEDP nl, 4+ mitral regurgitation, RHC showed mildly elevated LVEDP (53mmHg), giant V waves noted on PCWP w/ mild pulmonary HTN., pulmonary pressure 39/10 with a mean of 27 mmHg, mildly reduced cardiac output at 3.1 with a cardiac index of 2.09, recommned mitral valve repair, acceptable volume status  . Paroxysmal SVT (supraventricular tachycardia) ([place])   . PVC's (premature ventricular contractions)    a. Holter 2015 with 26,000 PVCs in 48 hours  . Severe mitral regurgitation    a. TTE 06/05/16: EF 60-65%, nl WM, LV dias fxn nl, mod prolapse of posterior mitral leaflet with mod eccentric regurg, LA mildly dilated, PASP nl; b. TEE 06/18/16: EF 65-70%, LV mildly dilated, no valvular vegetations seen throughout, mitral valve w/ flail motion involving the middle scallop of the post leaflet. Severe regurgitation  directed eccentrically, LA mildly dilated  . Shortness of breath dyspnea    1 flight stairs - winded  . Tobacco abuse   . Vertigo    no meds    Past Surgical History:  Procedure Laterality Date  . CARDIAC CATHETERIZATION N/A 06/19/2016   Procedure: Right/Left Heart Cath and Coronary Angiography;  Surgeon: Wellington Hampshire, MD;  Location: [place] [facility];  Service: Cardiovascular;  Laterality: N/A;  . CESAREAN SECTION    . CHOLECYSTECTOMY    . COLONOSCOPY N/A 12/07/2014   Procedure: COLONOSCOPY;  Surgeon: Lucilla Lame, MD;  Location: [place];  Service: Gastroenterology;  Laterality: N/A;  . ESOPHAGOGASTRODUODENOSCOPY N/A 12/07/2014   Procedure: ESOPHAGOGASTRODUODENOSCOPY (EGD);  Surgeon: Lucilla Lame, MD;  Location: [place];  Service: Gastroenterology;  Laterality: N/A;  . TEE WITHOUT CARDIOVERSION N/A 06/18/2016   Procedure: TRANSESOPHAGEAL ECHOCARDIOGRAM (TEE);  Surgeon: Nelva Bush, MD;  Location: [facility];  Service: Cardiovascular;  Laterality: N/A;  . TONSILLECTOMY      Family History  Problem Relation Age of Onset  . Stroke Father   . Alzheimer's disease Mother   . Cancer Brother   . Cancer Brother   . Cancer Sister     breast  . Breast cancer Sister 9  . Cancer Sister     breast    Social History   Social History  . Marital status: Married    Spouse name: N/A  . Number of children: N/A  . Years of education: N/A   Occupational History  . Not on file.  Social History Main Topics  . Smoking status: Current Every Day Smoker    Packs/day: 0.50    Years: 45.00    Types: Cigarettes  . Smokeless tobacco: Never Used  . Alcohol use No     Comment: occasional  . Drug use: No  . Sexual activity: Not on file   Other Topics Concern  . Not on file   Social History Narrative  . No narrative on file    Prior to Admission medications   Medication Sig Start Date End Date Taking? Authorizing Provider  polyethylene glycol (MIRALAX /  GLYCOLAX) packet Take 17 g by mouth daily.   Yes Historical Provider, MD  acetaminophen (TYLENOL) 325 MG tablet Take 2 tablets (650 mg total) by mouth every 6 (six) hours as needed for mild pain (or Fever >/= 101). 06/19/16   Loletha Grayer, MD  albuterol (PROVENTIL) (2.5 MG/3ML) 0.083% nebulizer solution Inhale 3 mLs (2.5 mg total) into the lungs every 4 (four) hours as needed for wheezing or shortness of breath. 06/19/16   Loletha Grayer, MD  amiodarone (PACERONE) 200 MG tablet Take 1 tablet (200 mg total) by mouth 2 (two) times daily. 06/19/16   Loletha Grayer, MD  aspirin 81 MG tablet Take 81 mg by mouth daily. AM    Historical Provider, MD  Calcium Carbonate-Vitamin D (CALCIUM-VITAMIN D) 500-200 MG-UNIT per tablet Take 1 tablet by mouth daily. 10 AM    Historical Provider, MD  Cholecalciferol (VITAMIN D3) 2000 UNITS TABS Take 1 tablet by mouth daily. 10 AM    Historical Provider, MD  diltiazem (CARDIZEM CD) 120 MG 24 hr capsule Take 1 capsule (120 mg total) by mouth daily. 06/19/16   Loletha Grayer, MD  furosemide (LASIX) 40 MG tablet Take 1 tablet (40 mg total) by mouth daily. 06/19/16   Loletha Grayer, MD  potassium chloride SA (K-DUR,KLOR-CON) 20 MEQ tablet Take 1 tablet (20 mEq total) by mouth daily. 06/19/16   Loletha Grayer, MD  tiotropium (SPIRIVA) 18 MCG inhalation capsule Place 1 capsule (18 mcg total) into inhaler and inhale daily. 06/19/16   Loletha Grayer, MD    Current Facility-Administered Medications  Medication Dose Route Frequency Provider Last Rate Last Dose  . 0.9 %  sodium chloride infusion  250 mL Intravenous PRN Erma Heritage, [place]      . acetaminophen (TYLENOL) tablet 650 mg  650 mg Oral Q4H PRN Erma Heritage, [place]      . amiodarone (PACERONE) tablet 200 mg  200 mg Oral BID Erma Heritage, [place]   200 mg at 06/20/16 0936  . aspirin EC tablet 81 mg  81 mg Oral Daily Erma Heritage, [place]   81 mg at 06/20/16 [id number]  . [START ON 06/21/2016] calcium-vitamin D  (OSCAL WITH D) 500-200 MG-UNIT per tablet 1 tablet  1 tablet Oral Q breakfast [place], [place]      . cholecalciferol (VITAMIN D) tablet 2,000 Units  2,000 Units Oral Daily Erma Heritage, [place]   2,000 Units at 06/20/16 [phone]  . diltiazem (CARDIZEM CD) 24 hr capsule 120 mg  120 mg Oral Daily Erma Heritage, [place]   120 mg at 06/20/16 0936  . docusate sodium (COLACE) capsule 100 mg  100 mg Oral BID [place] M [place], [place]      . enoxaparin (LOVENOX) injection 40 mg  40 mg Subcutaneous Q24H Fransisco Hertz [place], [place]   40 mg at 06/19/16 2048  . furosemide (LASIX) tablet 40 mg  40  mg Oral Daily Erma Heritage, [place]   40 mg at 06/20/16 [id number]  . ondansetron (ZOFRAN) injection 4 mg  4 mg Intravenous Q6H PRN Erma Heritage, [place]      . polyethylene glycol (MIRALAX / GLYCOLAX) packet 17 g  17 g Oral Daily [place], [place]      . potassium chloride SA (K-DUR,KLOR-CON) CR tablet 20 mEq  20 mEq Oral Daily Erma Heritage, [place]   20 mEq at 06/19/16 2049  . sodium chloride (OCEAN) 0.65 % nasal spray 1 spray  1 spray Each Nare PRN Erma Heritage, PA      . sodium chloride flush (NS) 0.9 % injection 3 mL  3 mL Intravenous Q12H Erma Heritage, [place]   3 mL at 06/19/16 2156  . sodium chloride flush (NS) 0.9 % injection 3 mL  3 mL Intravenous PRN Erma Heritage, [place]      . tiotropium [facility]) inhalation capsule 18 mcg  18 mcg Inhalation Daily Sanda Klein, MD   18 mcg at 06/20/16 0934    No Known Allergies    Review of Systems:   General:  normal appetite, normal energy, no weight gain, no weight loss, no fever  Cardiac:  no chest pain with exertion, occasional chest pain at rest, + SOB with exertion, + resting SOB, + PND, + orthopnea, + palpitations, + arrhythmia, no atrial fibrillation, no LE edema, no dizzy spells, no syncope  Respiratory:  + shortness of breath, no home oxygen, + productive cough, no dry cough, + bronchitis, no wheezing, no hemoptysis, no asthma, no pain with  inspiration or cough, no sleep apnea, no CPAP at night  GI:   no difficulty swallowing, no reflux, no frequent heartburn, no hiatal hernia, no abdominal pain, no constipation, no diarrhea, no hematochezia, no hematemesis, no melena  GU:   no dysuria,  no frequency, no urinary tract infection, no hematuria, no kidney stones, no kidney disease  Vascular:  no pain suggestive of claudication, no pain in feet, no leg cramps, no varicose veins, no DVT, no non-healing foot ulcer  Neuro:   no stroke, no TIA's, no seizures, no headaches, no temporary blindness one eye,  no slurred speech, no peripheral neuropathy, no chronic pain, no instability of gait, no memory/cognitive dysfunction  Musculoskeletal: no arthritis, no joint swelling, no myalgias, no difficulty walking, normal mobility   Skin:   n rash, no itching, no skin infections, no pressure sores or ulcerations  Psych:   no anxiety, no depression, no nervousness, no unusual recent stress  Eyes:   no blurry vision, no floaters, no recent vision changes, + wears glasses for reading  ENT:   no hearing loss, no loose or painful teeth, no dentures but multiple implants, last saw dentist 4 months ago  Hematologic:  no easy bruising, no abnormal bleeding, no clotting disorder, no frequent epistaxis  Endocrine:  no diabetes, does not check CBG's at home     Physical Exam:   BP (!) 110/53 (BP Location: Left Arm)   Pulse 76   Temp 98.1 F (36.7 C) (Oral)   Resp 18   Ht 4\' 11"  (1.499 m)   Wt 116 lb 9.6 oz (52.9 kg)   SpO2 96%   BMI 23.55 kg/m   General:  Thin,  well-appearing  HEENT:  Unremarkable   Neck:   no JVD, no bruits, no adenopathy   Chest:   clear to auscultation, symmetrical breath sounds, no wheezes, no rhonchi  CV:   RRR, grade IV/VI holosystolic murmur   Abdomen:  soft, non-tender, no masses   Extremities:  warm, well-perfused, pulses diminished but palpable, no lower extremity edema  Rectal/GU  Deferred  Neuro:   Grossly non-focal  and symmetrical throughout  Skin:   Clean and dry, no rashes, no breakdown  Diagnostic Tests:  Transthoracic Echocardiography  Patient:  Makia, Mannarino MR #:    [id number] Study Date: 07/20/2014 Gender:   F Age:    81 Height:   152.4 cm Weight:   52.6 kg BSA:    1.5 m^2 Pt. Status: Room:  ATTENDING  Kathlyn Sacramento, MD ORDERING   Kathlyn Sacramento, MD REFERRING  Kathlyn Sacramento, MD SONOGRAPHER Timmothy Sours, RDCS, RVT PERFORMING  [facility], [place]  cc:  ------------------------------------------------------------------- LV EF: 60% -  65%  ------------------------------------------------------------------- History:  PMH: Acquired from the patient and from the patient&'s chart. Chest pain. Palpitations and dyspnea. Risk factors: Current tobacco use.  ------------------------------------------------------------------- Study Conclusions  - Left ventricle: The cavity size was normal. Systolic function was normal. The estimated ejection fraction was in the range of 60% to 65%. Wall motion was normal; there were no regional wall motion abnormalities. Left ventricular diastolic function parameters were normal. - Mitral valve: moderate mitral valve prolapse of posterior leaflet. There was moderate to severe regurgitation. - Right ventricle: Systolic function was normal. - Pulmonary arteries: Systolic pressure was within the normal range.  Transthoracic echocardiography. M-mode, complete 2D, spectral Doppler, and color Doppler. Birthdate: Patient birthdate: 1946-04-29. Age: Patient is 70 yr old. Sex: Gender: female. BMI: 22.7 kg/m^2. Blood pressure:   120/70 Patient status: Outpatient. Study date: Study date: 07/20/2014. Study time: 02:50 PM.  -------------------------------------------------------------------  ------------------------------------------------------------------- Left ventricle: The cavity  size was normal. Systolic function was normal. The estimated ejection fraction was in the range of 60% to 65%. Wall motion was normal; there were no regional wall motion abnormalities. The transmitral flow pattern was normal. The deceleration time of the early transmitral flow velocity was normal. The pulmonary vein flow pattern was normal. The tissue Doppler parameters were normal. Left ventricular diastolic function parameters were normal.  ------------------------------------------------------------------- Aortic valve:  Trileaflet; normal thickness leaflets. Mobility was not restricted. Doppler: Transvalvular velocity was within the normal range. There was no stenosis. There was no regurgitation.  ------------------------------------------------------------------- Aorta: Aortic root: The aortic root was normal in size.  ------------------------------------------------------------------- Mitral valve: Poorly visualized. moderate mitral valve prolapse of posterior leaflet. Mobility was not restricted. Doppler: Transvalvular velocity was within the normal range. There was no evidence for stenosis. There was moderate to severe regurgitation. Peak gradient (D): 4 mm Hg.  ------------------------------------------------------------------- Left atrium: The atrium was normal in size.  ------------------------------------------------------------------- Right ventricle: The cavity size was normal. Wall thickness was normal. Systolic function was normal.  ------------------------------------------------------------------- Pulmonic valve:  Structurally normal valve.  Cusp separation was normal. Doppler: Transvalvular velocity was within the normal range. There was no evidence for stenosis. There was no regurgitation.  ------------------------------------------------------------------- Tricuspid valve:  Structurally normal valve.  Doppler: Transvalvular velocity was  within the normal range. There was trivial regurgitation.  ------------------------------------------------------------------- Pulmonary artery:  The main pulmonary artery was normal-sized. Systolic pressure was within the normal range.  ------------------------------------------------------------------- Right atrium: The atrium was normal in size.  ------------------------------------------------------------------- Pericardium: There was no pericardial effusion.  ------------------------------------------------------------------- Systemic veins: Inferior vena cava: The vessel was normal in size.  ------------------------------------------------------------------- Measurements  Left ventricle               Value  Reference LV ID, ED, PLAX chordal           49.6 mm   43 - 52 LV ID, ES, PLAX chordal           28.8 mm   23 - 38 LV fx shortening, PLAX chordal       42  %   >=29 LV PW thickness, ED             11  mm   --------- IVS/LV PW ratio, ED             0.78     <=1.3 LV ejection fraction, 1-p A4C        48  %   --------- LV end-diastolic volume, 2-p        92  ml   --------- LV end-systolic volume, 2-p         41  ml   --------- LV ejection fraction, 2-p          55  %   --------- Stroke volume, 2-p             51  ml   --------- LV end-diastolic volume/bsa, 2-p      61  ml/m^2 --------- LV end-systolic volume/bsa, 2-p       27  ml/m^2 --------- Stroke volume/bsa, 2-p           34  ml/m^2 ---------  Ventricular septum             Value    Reference IVS thickness, ED              8.53 mm   ---------  Aorta                    Value    Reference Aortic root ID, ED             29  mm    ---------  Left atrium                 Value    Reference LA ID, A-P, ES               37  mm   --------- LA ID/bsa, A-P          (H)    2.47 cm/m^2 <=2.2 LA volume, S                34  ml   --------- LA volume/bsa, S              22.7 ml/m^2 ---------  Mitral valve                Value    Reference Mitral E-wave peak velocity         97.2 cm/s  --------- Mitral A-wave peak velocity         79  cm/s  --------- Mitral deceleration time     (H)    335  ms   150 - 230 Mitral peak gradient, D           4   mm Hg --------- Mitral E/A ratio, peak           1.2     --------- Mitral maximal regurg velocity,       549  cm/s  --------- PISA Mitral regurg VTI, PISA           181  cm   --------- Mitral ERO, PISA  0.38 cm^2  --------- Mitral regurg volume, PISA         69  ml   ---------  Right ventricle               Value    Reference RV ID, ED, PLAX               32.8 mm   19 - 38  Legend: (L) and (H) mark values outside specified reference range.  ------------------------------------------------------------------- Prepared and Electronically Authenticated by  Esmond Plants, MD, [facility] 2015-12-17T17:35:28   Transthoracic Echocardiography  Patient:    Santasha, Karpman MR #:       [id number] Study Date: 06/05/2016 Gender:     F Age:        13 Height:     149.9 cm Weight:     54.7 kg BSA:        1.52 m^2 Pt. Status: Room:   ATTENDING    Default, Provider [phone]  Candelaria Stagers, MD  [place], MD  PERFORMING   [place], [place]  SONOGRAPHER  Pilar Jarvis, RVT, RDCS, RDMS  cc:  ------------------------------------------------------------------- LV EF: 60% -    65%  ------------------------------------------------------------------- History:   PMH:   Lightheadedness and dyspnea.  Mitral valve disease.  Mitral valve prolapse.  Risk factors:  Current tobacco use.  ------------------------------------------------------------------- Study Conclusions  - Left ventricle: The cavity size was normal. Systolic function was   normal. The estimated ejection fraction was in the range of 60%   to 65%. Wall motion was normal; there were no regional wall   motion abnormalities. Left ventricular diastolic function   parameters were normal. - Mitral valve: Moderate prolapse of the posterior leaflet. There   was moderate eccentric regurgitation. - Left atrium: The atrium was mildly dilated. - Right ventricle: Systolic function was normal. - Pulmonary arteries: Systolic pressure was within the normal   range.  ------------------------------------------------------------------- Labs, prior tests, procedures, and surgery: ECG.     Abnormal.  ------------------------------------------------------------------- Study data:  The previous study was not available, so comparison was made to the report of December 2015.  Study status:  Routine. Procedure:  Transthoracic echocardiography. Image quality was good.  Study completion:  There were no complications. Transthoracic echocardiography.  M-mode, complete 2D, spectral Doppler, and color Doppler.  Birthdate:  Patient birthdate: [birth year].  Age:  Patient is 70 yr old.  Sex:  Gender: female. BMI: 24.3 kg/m^2.  Blood pressure:     102/60  Patient status: Outpatient.  Study date:  Study date: 06/05/2016. Study time: 10:57 AM.  -------------------------------------------------------------------  ------------------------------------------------------------------- Left ventricle:  The cavity size was normal. Systolic function was normal. The estimated ejection fraction was in the range of 60% to 65%. Wall  motion was normal; there were no regional wall motion abnormalities. The transmitral flow pattern was normal. The deceleration time of the early transmitral flow velocity was normal. The pulmonary vein flow pattern was normal. The tissue Doppler parameters were normal. Left ventricular diastolic function parameters were normal.  ------------------------------------------------------------------- Aortic valve:   Trileaflet; normal thickness leaflets. Mobility was not restricted.  Doppler:  Transvalvular velocity was within the normal range. There was no stenosis. There was no regurgitation.   ------------------------------------------------------------------- Aorta:  Aortic root: The aortic root was normal in size.  ------------------------------------------------------------------- Mitral valve:  Mobility was not restricted. Moderate prolapse of the posterior leaflet.  Doppler:  Transvalvular velocity was within the normal range. There was no evidence for  stenosis. There was moderate eccentric regurgitation.    Peak gradient (D): 7 mm Hg.   ------------------------------------------------------------------- Left atrium:  The atrium was mildly dilated.  ------------------------------------------------------------------- Right ventricle:  The cavity size was normal. Wall thickness was normal. Systolic function was normal.  ------------------------------------------------------------------- Pulmonic valve:    Structurally normal valve.   Cusp separation was normal.  Doppler:  Transvalvular velocity was within the normal range. There was no evidence for stenosis. There was no regurgitation.  ------------------------------------------------------------------- Tricuspid valve:   Structurally normal valve.    Doppler: Transvalvular velocity was within the normal range. There was no regurgitation.  ------------------------------------------------------------------- Pulmonary  artery:   The main pulmonary artery was normal-sized. Systolic pressure was within the normal range.  ------------------------------------------------------------------- Right atrium:  The atrium was normal in size.  ------------------------------------------------------------------- Pericardium:  There was no pericardial effusion.  ------------------------------------------------------------------- Systemic veins: Inferior vena cava: The vessel was normal in size.  ------------------------------------------------------------------- Measurements   Left ventricle                          Value        Reference  LV ID, ED, PLAX chordal                 49.7  mm     43 - 52  LV ID, ES, PLAX chordal                 34.4  mm     23 - 38  LV fx shortening, PLAX chordal          31    %      >=29  LV PW thickness, ED                     11.4  mm     ---------  IVS/LV PW ratio, ED                     0.84         <=1.3  Stroke volume, 2D                       51    ml     ---------  Stroke volume/bsa, 2D                   34    ml/m^2 ---------  LV ejection fraction, 1-p A4C           51    %      ---------  LV e&', lateral                          3.92  cm/s   ---------  LV E/e&', lateral                        33.67        ---------  LV e&', medial                           5.77  cm/s   ---------  LV E/e&', medial                         22.88        ---------  LV e&', average  4.85  cm/s   ---------  LV E/e&', average                        27.24        ---------    Ventricular septum                      Value        Reference  IVS thickness, ED                       9.62  mm     ---------    LVOT                                    Value        Reference  LVOT ID, S                              20    mm     ---------  LVOT area                               3.14  cm^2   ---------  LVOT ID                                 20    mm     ---------  LVOT peak  velocity, S                   80.8  cm/s   ---------  LVOT mean velocity, S                   52    cm/s   ---------  LVOT VTI, S                             16.4  cm     ---------  Stroke volume (SV), LVOT DP             51.5  ml     ---------  Stroke index (SV/bsa), LVOT DP          33.9  ml/m^2 ---------    Aorta                                   Value        Reference  Aortic root ID, ED                      28    mm     ---------  Ascending aorta ID, A-P, S              31    mm     ---------    Left atrium                             Value        Reference  LA ID, A-P, ES  40    mm     ---------  LA ID/bsa, A-P                  (H)     2.63  cm/m^2 <=2.2  LA volume, S                            63.4  ml     ---------  LA volume/bsa, S                        41.7  ml/m^2 ---------  LA volume, ES, 1-p A4C                  66.2  ml     ---------  LA volume/bsa, ES, 1-p A4C              43.5  ml/m^2 ---------  LA volume, ES, 1-p A2C                  54.6  ml     ---------  LA volume/bsa, ES, 1-p A2C              35.9  ml/m^2 ---------    Mitral valve                            Value        Reference  Mitral E-wave peak velocity             132   cm/s   ---------  Mitral A-wave peak velocity             57.4  cm/s   ---------  Mitral deceleration time                190   ms     150 - 230  Mitral peak gradient, D                 7     mm Hg  ---------  Mitral E/A ratio, peak                  2.3          ---------    Right ventricle                         Value        Reference  TAPSE                                   23    mm     ---------    Pulmonic valve                          Value        Reference  Pulmonic valve peak velocity, S         51.4  cm/s   ---------  Legend: (L)  and  (H)  mark values outside specified reference range.  ------------------------------------------------------------------- Prepared and Electronically Authenticated  by  Esmond Plants, MD, [facility] 2017-11-02T19:25:40    Transesophageal Echocardiography  Patient:    Keslee, Economou MR #:       [id number] Study Date: 06/18/2016 Gender:  F Age:        67 Height:     149.9 cm Weight:     54.5 kg BSA:        1.52 m^2 Pt. Status: Room:   ATTENDING    Daun Peacock, Ryan M  REFERRING    Dunn, Ryan M  ADMITTING    Harrie Foreman  PERFORMING   [facility], [employer]  SONOGRAPHER  Mikki Santee  cc:  ------------------------------------------------------------------- LV EF: 65% -   70%  ------------------------------------------------------------------- Indications:      Mitral regurgitation 424.0.  ------------------------------------------------------------------- History:   PMH:   Dyspnea.  Coronary artery disease.  Risk factors:  Current tobacco use.  ------------------------------------------------------------------- Study Conclusions  - Left ventricle: The cavity size was mildly dilated. Systolic   function was vigorous. The estimated ejection fraction was in the   range of 65% to 70%. - Aortic valve: No evidence of vegetation. - Mitral valve: Flail motion involving the middle scallop of the   posterior leaflet. There was severe regurgitation directed   eccentrically. Severe regurgitation is suggested by pulmonary   vein systolic flow reversal and a vena contracta width >= 6 mm. - Left atrium: The atrium was dilated. No evidence of thrombus in   the atrial cavity or appendage. - Right atrium: No evidence of thrombus in the atrial cavity or   appendage. - Tricuspid valve: No evidence of vegetation. - Pulmonic valve: No evidence of vegetation.  Impressions:  - Flail posterior leaflet of the mitral valve with severe,   eccentric regurgitation.  ------------------------------------------------------------------- Study data:   Study status:  Routine.  Consent:  The risks, benefits, and  alternatives to the procedure were explained to the patient and informed consent was obtained.  Procedure:  The patient reported no pain pre or post test. Initial setup. The patient was brought to the laboratory. Surface ECG leads were monitored. Sedation. Conscious sedation was administered by cardiology staff. Transesophageal echocardiography. Topical anesthesia was obtained using viscous lidocaine. A transesophageal probe was inserted by the attending cardiologist. Image quality was adequate.  Study completion:  The patient tolerated the procedure well. There were no complications.  Administered medications:   Fentanyl, 90mcg. Midazolam, 2mg .          Diagnostic transesophageal echocardiography.  2D and color Doppler.  Birthdate:  Patient birthdate: [birth year].  Age:  Patient is 70 yr old.  Sex:  Gender: female.    BMI: 24.3 kg/m^2.  Blood pressure:     127/69  Patient status:  Inpatient.  Study date:  Study date: 06/18/2016. Study time: 12:03 PM.  Location:  Endoscopy.  -------------------------------------------------------------------  ------------------------------------------------------------------- Left ventricle:  The cavity size was mildly dilated. Systolic function was vigorous. The estimated ejection fraction was in the range of 65% to 70%.  ------------------------------------------------------------------- Aortic valve:   Structurally normal valve.   Cusp separation was normal.  No evidence of vegetation.  Doppler:  There was no regurgitation.  ------------------------------------------------------------------- Aorta:  There was atheromatous plaque involving the descending aorta. Aortic root: The aortic root was normal in size. Ascending aorta: The ascending aorta was normal in size.  ------------------------------------------------------------------- Mitral valve:   Mildly thickened leaflets . Flail motion involving the middle scallop of the posterior  leaflet.  Doppler:  There was severe regurgitation directed eccentrically. Severe regurgitation is suggested by pulmonary vein systolic flow reversal and a vena contracta width >= 6 mm.  ------------------------------------------------------------------- Left atrium:  The atrium was dilated.  No evidence  of thrombus in the atrial cavity or appendage.  ------------------------------------------------------------------- Pulmonic valve:    Structurally normal valve.   Cusp separation was normal.  No evidence of vegetation.  ------------------------------------------------------------------- Tricuspid valve:   Structurally normal valve.   Leaflet separation was normal.  No evidence of vegetation.  Doppler:  There was no regurgitation.  ------------------------------------------------------------------- Pulmonary artery:   The main pulmonary artery was normal-sized.  ------------------------------------------------------------------- Right atrium:  The atrium was normal in size.  No evidence of thrombus in the atrial cavity or appendage.  ------------------------------------------------------------------- Pericardium:  There was no pericardial effusion.   ------------------------------------------------------------------- Prepared and Electronically Authenticated by  Nelva Bush, MD 2017-11-15T13:26:52     Right/Left Heart Cath and Coronary Angiography  Conclusion     The left ventricular systolic function is normal.  LV end diastolic pressure is normal.  There is severe (4+) mitral regurgitation.   1. Normal coronary arteries and a left dominant system. 2. Normal LV systolic function with an ejection fraction of 55%. 3. Severe mitral regurgitation with severely dilated left atrium. 4. Right heart catheterization showed mildly elevated left ventricular end-diastolic pressure (14 mmHg), giant V waves noted on pulmonary capillary wedge pressure with mild  pulmonary hypertension. Pulmonary pressure was 39/10 with a mean of 27 mmHg. Mildly reduced cardiac output at 3.1 with a cardiac index of 2.09.  Recommendations: I recommend mitral valve repair. The patient volume status appears to be acceptable. Continue same dose of furosemide.   Indications   Non-rheumatic mitral regurgitation [I34.0 (ICD-10-CM)]  Procedural Details/Technique   Technical Details Procedural Details: There was a pre-existing IV in the right antecubital vein. I could not advance the wire through this. I used ultrasound guidance to identifying antecubital vein but was not successful. Thus, I placed a 7 French sheath in the right femoral vein. The right wrist was prepped, draped, and anesthetized with 1% lidocaine. Using the modified Seldinger technique, a 5 French sheath was introduced into the right radial artery. 2.5 mg of verapamil was administered through the sheath, weight-based unfractionated heparin was administered intravenously. Right heart catheterization was performed using a 5 French Swan-Ganz catheter. Cardiac output was calculated by the Fick method. A Jackie catheter was used for selective coronary angiography. A pigtail catheter was used for left ventriculography. Catheter exchanges were performed over an exchange length guidewire. There were no immediate procedural complications. A TR band was used for radial hemostasis at the completion of the procedure. The patient was transferred to the post catheterization recovery area for further monitoring.   Estimated blood loss <50 mL.  During this procedure the patient was administered the following to achieve and maintain moderate conscious sedation: Versed 2 mg, Fentanyl 50 mcg, while the patient's heart rate, blood pressure, and oxygen saturation were continuously monitored. The period of conscious sedation was 56 minutes, of which I was present face-to-face 100% of this time.    Coronary Findings   Dominance: Left   Left Main  Vessel is angiographically normal.  Left Anterior Descending  Vessel is angiographically normal.  First Diagonal Branch  Vessel is angiographically normal.  Second Diagonal Branch  Vessel is angiographically normal.  Third Diagonal Branch  Vessel is small in size. Vessel is angiographically normal.  Left Circumflex  Vessel is angiographically normal.  First Obtuse Marginal Branch  Vessel is small in size. Vessel is angiographically normal.  Second Obtuse Marginal Branch  Vessel is angiographically normal.  Third Obtuse Marginal Branch  Vessel is angiographically normal.  Fourth Obtuse Marginal Branch  Vessel  is angiographically normal.  Left Posterior Descending Artery  Vessel is angiographically normal.  Right Coronary Artery  Vessel is angiographically normal.  Wall Motion              Left Heart   Left Ventricle The left ventricular size is normal. The left ventricular systolic function is normal. LV end diastolic pressure is normal. No regional wall motion abnormalities. There is severe (4+) mitral regurgitation.    Coronary Diagrams   Diagnostic Diagram         Impression:  Patient has stage D severe symptomatic primary mitral regurgitation.  She describes a several year history of progressive symptoms of exertional shortness of breath and fatigue with recent acute exacerbation causing hospitalization for acute hypoxemic respiratory failure secondary to acute on chronic diastolic congestive heart failure, [place] Heart Association functional class IV.  At the time of her acute presentation she was also treated for likely superimposed community acquired pneumonia and/or COPD flare.  Symptoms have improved dramatically over the last several days and the patient is now comfortable breathing on room air.  I have personally reviewed the patient's multiple transthoracic echocardiograms, transesophageal echocardiogram, and diagnostic cardiac catheterization.  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 Left ventricular systolic function remains preserved. Catheterization is notable for the absence of significant coronary artery disease and pulmonary artery pressures were only mildly elevated.  I agree the patient needs mitral valve repair. The patient has no documented history of atrial fibrillation although she did have reported history of recurrent bouts of SVT during her recent hospitalization at [facility] where she was treated for likely community-acquired pneumonia.  She has nearly completed a weeklong course of intravenous antibiotics.  Her white blood count has been trending down but remains abnormal.    Plan:  The patient and her family were counseled at length regarding the indications, risks and potential benefits of mitral valve repair.  The rationale for elective surgery has been explained, including a comparison between surgery and continued medical therapy with close follow-up.  The likelihood of successful and durable valve repair has been discussed with particular reference to the findings of their recent echocardiogram.  Based upon these findings and previous experience, I have quoted them a greater than 95 percent likelihood of successful valve repair.   Alternative surgical approaches have been discussed including a comparison between conventional median sternotomy and minimally invasive techniques.  Expectations for her postoperative convalescence have been discussed.  The importance of smoking cessation has been emphasized.  The timing of elective surgery has been discussed including the possibility of keeping the patient in the hospital until surgery can be done sometime next week versus hospital discharge with plans to return for elective surgery the week after Thanksgiving.  The patient prefers to go home and return for surgery on  Wednesday, 07/02/2016 as long as she remains clinically stable.  I favor beginning the patient on amiodarone to further decrease her risk of recurrent atrial dysrhythmias both prior to and after surgery.  We will obtain pulmonary function tests. The patient will also undergo CT angiography of the aorta and iliac vessels to evaluate the feasibility of peripheral cannulation for surgery. All of her questions have been addressed. If the patient is to be discharged from the hospital, I will plan to see her back in the office on Monday, 06/30/2016 prior to surgery.  Please let us know if her  condition deteriorates significantly and/or it is felt that she is not stable enough for hospital discharge.   I spent in excess of 120 minutes during the conduct of this hospital consultation and >50% of this time involved direct face-to-face encounter for counseling and/or coordination of the patient's care.    Valentina Gu. Roxy Manns, MD 06/20/2016 9:39 AM

## 2016-06-20 NOTE — Progress Notes (Signed)
Pt. Was stuck x 2 by 2 RT's for ABG & all attempts were unsuccessful. Both attempts were left radial. Pt.'s right side is restricted & pt. Was an IV prohibiting being stuck left brachial. RN was notified that all attempts for ABG were not successful & stated that ABG would be ordered next week when pt. Was admitted.

## 2016-06-20 NOTE — Progress Notes (Unsigned)
Pf

## 2016-06-20 NOTE — Progress Notes (Signed)
CARDIAC REHAB PHASE I   PRE:  Rate/Rhythm: 78 SR    BP: sitting 116/60    SaO2: 97 RA  MODE:  Ambulation: 460 ft   POST:  Rate/Rhythm: 91 SR with PVC    BP: sitting 116/54     SaO2: 100 RA  Tolerated well, no c/o, feels good. Discussed pre-op ed with pt. Also discussed smoking cessation. She and her husband are planning to quit and have quit before. Gave resources and fake cigarette. Gave [facility] booklet and guideline and video to watch. Voiced understanding.  [id number]   Darrick Meigs CES, ACSM 06/20/2016 10:59 AM

## 2016-06-20 NOTE — Discharge Summary (Signed)
Discharge Summary    Patient ID: Nicole Norman,  MRN: [id number], DOB/AGE: 1945-08-14 70 y.o.  Admit date: 06/19/2016 Discharge date: 06/20/2016  [facility] Primary Cardiologist: Dr. Fletcher Anon  Discharge Diagnoses    Principal Problem:   Acute respiratory distress Active Problems:   PVC (premature ventricular contraction)   Severe mitral regurgitation   Tobacco use   Constipation   SVT (supraventricular tachycardia) (HCC)   Acute on chronic combined systolic and diastolic CHF (congestive heart failure) (HCC)   Normal coronary arteries   Pulmonary hypertension   Bronchitis   CAP (community acquired pneumonia)   COPD (chronic obstructive pulmonary disease) (HCC)   Allergies No Known Allergies  Diagnostic Studies/Procedures    Prior to this [facility] admit 06/19/16 Procedures   Right/Left Heart Cath and Coronary Angiography  Conclusion     The left ventricular systolic function is normal.  LV end diastolic pressure is normal.  There is severe (4+) mitral regurgitation.   1. Normal coronary arteries and a left dominant system. 2. Normal LV systolic function with an ejection fraction of 55%. 3. Severe mitral regurgitation with severely dilated left atrium. 4. Right heart catheterization showed mildly elevated left ventricular end-diastolic pressure (14 mmHg), giant V waves noted on pulmonary capillary wedge pressure with mild pulmonary hypertension. Pulmonary pressure was 39/10 with a mean of 27 mmHg. Mildly reduced cardiac output at 3.1 with a cardiac index of 2.09.  Recommendations: I recommend mitral valve repair. The patient volume status appears to be acceptable. Continue same dose of furosemide.   Indications   Non-rheumatic mitral regurgitation [I34.0 (ICD-10-CM)]   Coronary Findings   Dominance: Left  Left Main  Vessel is angiographically normal.  Left Anterior Descending  Vessel is angiographically normal.    First Diagonal Branch  Vessel is angiographically normal.  Second Diagonal Branch  Vessel is angiographically normal.  Third Diagonal Branch  Vessel is small in size. Vessel is angiographically normal.  Left Circumflex  Vessel is angiographically normal.  First Obtuse Marginal Branch  Vessel is small in size. Vessel is angiographically normal.  Second Obtuse Marginal Branch  Vessel is angiographically normal.  Third Obtuse Marginal Branch  Vessel is angiographically normal.  Fourth Obtuse Marginal Branch  Vessel is angiographically normal.  Left Posterior Descending Artery  Vessel is angiographically normal.  Right Coronary Artery  Vessel is angiographically normal.    Left Heart   Left Ventricle The left ventricular size is normal. The left ventricular systolic function is normal. LV end diastolic pressure is normal. No regional wall motion abnormalities. There is severe (4+) mitral regurgitation     Hemo Data   AO Systolic Cath Pressure AO Diastolic Cath Pressure AO Mean Cath Pressure LV Systolic Cath Pressure LV End Diastolic PA Systolic Cath Pressure PA Diastolic Cath Pressure PA Mean Cath Pressure RV Systolic Cath Pressure RV Diastolic Cath Pressure RV End Diastolic AO O2 Sat PA O2 Sat AO O2 Sat  -- -- -- -- -- -- -- -- 31 mmHg 2 mmHg 4 mmHg -- -- --  -- -- -- -- -- 39 mmHg 10 mmHg 27 mmHg -- -- -- -- -- --  110 61 mmHg 78 mmHg -- -- -- -- -- -- -- -- -- -- --  -- -- -- 111 mmHg 14 mmHg -- -- -- -- -- -- -- -- --  -- -- -- 113 mmHg 12 mmHg -- -- -- -- -- -- -- -- --  -- -- -- 113 mmHg  14 mmHg -- -- -- -- -- -- -- -- --  113 57 mmHg 78 mmHg -- -- -- -- -- -- -- -- -- -- --  -- --               TEE 06/18/16 ------------------------------------------------------------------- Study Conclusions  - Left ventricle: The cavity size was mildly dilated. Systolic   function was vigorous. The estimated ejection fraction was in the   range of 65% to 70%. - Aortic valve: No  evidence of vegetation. - Mitral valve: Flail motion involving the middle scallop of the   posterior leaflet. There was severe regurgitation directed   eccentrically. Severe regurgitation is suggested by pulmonary   vein systolic flow reversal and a vena contracta width >= 6 mm. - Left atrium: The atrium was dilated. No evidence of thrombus in   the atrial cavity or appendage. - Right atrium: No evidence of thrombus in the atrial cavity or   appendage. - Tricuspid valve: No evidence of vegetation. - Pulmonic valve: No evidence of vegetation.  Impressions:  - Flail posterior leaflet of the mitral valve with severe,   eccentric regurgitation.  __  TEE 06/05/16 _------------------------------------------------------------------- Study Conclusions  - Left ventricle: The cavity size was normal. Systolic function was   normal. The estimated ejection fraction was in the range of 60%   to 65%. Wall motion was normal; there were no regional wall   motion abnormalities. Left ventricular diastolic function   parameters were normal. - Mitral valve: Moderate prolapse of the posterior leaflet. There   was moderate eccentric regurgitation. - Left atrium: The atrium was mildly dilated. - Right ventricle: Systolic function was normal. - Pulmonary arteries: Systolic pressure was within the normal   range.  06/20/16 CT angio COMPARISON:  CT 07/15/2013  FINDINGS: VASCULAR  Aorta: Mild atheromatous irregularity with calcified plaque predominately in the infrarenal segment. Some eccentric nonocclusive mural thrombus in the infrarenal segment. No aneurysm, dissection, or stenosis.  Celiac: Mild short-segment narrowing at the level of the median arcuate ligament of the diaphragm, patent distally.  SMA: Patent without evidence of aneurysm, dissection, vasculitis or significant stenosis.  Renals: Duplicated right renal arteries, superior dominant. Bilateral renal arteries are  patent without evidence of aneurysm, dissection, vasculitis, fibromuscular dysplasia or significant stenosis.  IMA: Patent without evidence of aneurysm, dissection, vasculitis or significant stenosis.  Inflow: Nonocclusive calcified plaque throughout the common iliac arteries. Left common iliac ectatic up to 13 mm diameter, right 11 mm. No dissection or stenosis. External iliac arteries unremarkable.  Proximal Outflow: Bilateral common femoral and visualized portions of the superficial and profunda femoral arteries are patent without evidence of aneurysm, dissection, vasculitis or significant stenosis.  Veins: No obvious venous abnormality within the limitations of this arterial phase study.  Review of the MIP images confirms the above findings.  NON-VASCULAR  Lower chest: Trace bilateral pleural effusions right greater than left. Dependent subsegmental atelectasis posteriorly in the visualized lower lobes.  Hepatobiliary: 18 mm left hepatic cyst as before. No new hepatic lesion. Interval cholecystectomy.  Pancreas: Unremarkable. No pancreatic ductal dilatation or surrounding inflammatory changes.  Spleen: Normal in size without focal abnormality.  Adrenals/Urinary Tract: Adrenal glands are unremarkable. Kidneys are normal, without renal calculi, focal lesion, or hydronephrosis. Bladder is unremarkable.  Stomach/Bowel: Stomach is within normal limits. Appendix appears normal. No evidence of bowel wall thickening, distention, or inflammatory changes.  Lymphatic: No adenopathy localized.  Reproductive: Uterus and bilateral adnexa are unremarkable.  Other: No ascites.  No free  air.  Musculoskeletal: No acute or significant osseous findings.  IMPRESSION: VASCULAR  1. No acute findings. 2. Iliac and Aortic Atherosclerosis (ICD10-170.0) without significant stenosis or occlusion. 3. Celiac axis narrowing at the level of the median arcuate  ligament of the diaphragm.  NON-VASCULAR  1. Trace pleural effusions right greater than left. 2. Benign left hepatic cyst.  PCXR 06/20/16 PORTABLE CHEST 1 VIEW  COMPARISON:  Portable chest x-ray of June 17, 2016  FINDINGS: The lungs are hyperinflated with hemidiaphragm flattening. There is no focal infiltrate. There is has been resolution of the pleural effusion on the right with only a trace of fluid remaining. The heart and pulmonary vascularity are normal. There is calcification in the wall of the aortic arch.  IMPRESSION: COPD. No pneumonia nor CHF. A trace of pleural fluid persists on the right.  Thoracic aortic atherosclerosis.  ___  Carotid Dopplers and Upper ext.  06/20/16 Bilateral: No significant (1-39%) ICA stenosis. Antegrade vertebral flow. _______   History of Present Illness     70 y.o. female with h/o MVP of posterior leaflet with previously noted moderate to severe mitral valve regurgitation now severe with flailing of the middle scallop of the posterior leaflet by TEE 06/18/2016. Also with history of paroxysmal SVT and frequent PVCs well controlled on beta blocker as an outpatient, prolonged history of tobacco abuse with continued abuse at nearly 1 pack daily, normal coronary arteries by cardiac cath 06/19/2016, chronic combined CHF, mild pulmonary hypertension, migraine disorder, vertigo, and chronic constipation who was transferred to [facility] from [facility] for evaluation and treatment of severe mitral valve disease.   She previously underwent 48-hour Holter monitoring in 2015 that showed a total of 26,000 PVCs over 48 hours. Nuclear stress testing at that time showed normal LV systolic function with no evidence of ischemia. Echo in 2015 showed a normal EF at 60-65%, moderate posterior mitral valve prolapse with moderate to severe mitral regurgitation. Her symptoms were noted to have improved on metoprolol. She was doing well at her last outpatient follow  up on 05/15/2016. She was scheduled for outpatient echocardiogram on 06/05/2016 to evaluate her mitral valve disease. This echo showed a normal LV systolic function with an EF of 60-65%, normal wall motion, normal LV diastolic function parameters, moderate prolapse of the posterior leaflet with moderate eccentric regurgitation, left atrium was mildly dilated, RV systolic function was normal, PASP was normal. She was also scheduled to wear a Holter monitor to evaluate PVC burden (had not yet started this). She was advised to follow up after her Holter was completed. Unfortunately, she presented to [facility] on 11/10 with increased SOB felt to be in the setting of CAP vs bronchtiis. She did require BiPAP briefly, though was able to be transitioned to nasal cannula upon arrival to the [facility] ICU. On 11/12 she was noted to have approximately 20 minutes of SVT with a rate that peaked at 197 bpm. She was started on amiodarone gtt by PCCM which did not help in rate or rhythm control. Upon cardiology arriving she was given adenosine 6 mg IV push with resolution of SVT and conversion to sinus rhythm in the 90's bpm to low 100's bpm. She was very symptomatic with her SVT. She was felt to be somewhat volume overloaded and started on IV Lasix 40 mg bid with close monitoring of her soft BP in the low [id number] mmHg systolic. She diuresed well and was transitioned to PO Lasix. Her beta blocker was held given poor pulmonary function and  she was placed on Cardizem with good response. Her respiratory status continued to improve and she was transitioned to room air without difficulty. She underwent TEE on 11/15 that showed normal EF of 60-65%, flail motion of the middle scallop of the posterior mitral valve leaflet with severe mitral regurgitation. She underwent right and left heart catheterization on 06/19/2016 that showed normal coronary arteries, 4+ mitral regurgitation, LVEDP 14 mmHg, giant V waves with mild pulmonary hypertension, pulmonary  pressure was 39/10 with a mean of 27 mmHg. Mildly reduced cardiac output at 3.1 with a cardiac index of 2.09. Her volume status was felt to be acceptable. Her amiodarone was decreased to 200 mg bid (left on 2/2 PVCs rather than SVT). It was recommended she be transferred to [facility] for evaluation and treatment of her severe mitral valve disease.    Hospital Course     Consultants: Dr. Lilly Cove  TCTS  Today after arrival last pm pt is feeling great, respirations feel back to normal. She has maintained SR on tele.   She is negative 471 ml. Wt down from 117 to 116.    Dr. Roxy Manns has seen and evaluated pt.  Plan is to obtain carotid duplex. -minimal disease seen, PFTs to be done 06/23/16 as outpt. and CT angio results above.  Pt would like to have surgery after Thanksgiving - Dr. Donivan Scull and Dr. Roxy Manns have agreed but if SOB develops pt should return to ER.  Other wise Dr. Roxy Manns will see on the 27th of Nov.   RT was unable to obtain ABGs these will be done when pt returns for surgery.   She will maintain a low sodium diet.  She will follow up with  Dr. Leonidas Romberg on 07/01/16.    She was walked in hall with cardiac rehab and did well.  She understands she must stop tobacco.    She has been started on an amiodarone load to reduce the risk of post op a fib.  Amiodarone 200 mg BID.   She has been evaluated by Dr. Sallyanne Kuster and stable for discharge.    _____________  Discharge Vitals Blood pressure (!) 106/46, pulse 73, temperature 98.1 F (36.7 C), temperature source Oral, resp. rate 20, height 4\' 11"  (1.499 m), weight 116 lb 9.6 oz (52.9 kg), SpO2 98 %.  Filed Weights   06/19/16 1826 06/20/16 0500  Weight: 117 lb 4.6 oz (53.2 kg) 116 lb 9.6 oz (52.9 kg)    Labs & Radiologic Studies    CBC  Recent Labs  06/18/16 0811 06/19/16 1920  WBC 17.8* 11.9*  NEUTROABS  --  7.7  HGB 14.2 11.9*  HCT 41.2 35.7*  MCV 90.9 91.5  PLT 239 [id number]   Basic Metabolic Panel  Recent Labs  06/19/16 0330  06/19/16 1920 06/20/16 0159  NA 136  --  139  K 3.7  --  3.6  CL 102  --  105  CO2 26  --  26  GLUCOSE 116*  --  106*  BUN 21*  --  14  CREATININE 0.81 0.97 0.88  CALCIUM 8.9  --  8.8*  MG 1.9  --   --    Liver Function Tests  Recent Labs  06/20/16 0159  AST 14*  ALT 16  ALKPHOS 61  BILITOT 0.3  PROT 5.7*  ALBUMIN 3.1*   No results for input(s): LIPASE, AMYLASE in the last 72 hours. Cardiac Enzymes No results for input(s): CKTOTAL, CKMB, CKMBINDEX, TROPONINI in the last 72 hours. BNP  Invalid input(s): POCBNP D-Dimer No results for input(s): DDIMER in the last 72 hours. Hemoglobin A1C No results for input(s): HGBA1C in the last 72 hours. Fasting Lipid Panel No results for input(s): CHOL, HDL, LDLCALC, TRIG, CHOLHDL, LDLDIRECT in the last 72 hours. Thyroid Function Tests No results for input(s): TSH, T4TOTAL, T3FREE, THYROIDAB in the last 72 hours.  Invalid input(s): FREET3 _____________  Dg Chest 1 View  Result Date: 06/15/2016 CLINICAL DATA:  Dyspnea.  History of dysrhythmia. EXAM: CHEST 1 VIEW COMPARISON:  06/13/2016 FINDINGS: Normal heart size. Small right pleural effusion. Increased from previous exam. Mild diffuse pulmonary edema, increased from previous exam. IMPRESSION: Increase in CHF. Electronically Signed   By: Kerby Moors M.D.   On: 06/15/2016 10:49   Ct Angio Chest Pe W And/or Wo Contrast  Result Date: 06/14/2016 CLINICAL DATA:  Respiratory distress.  D-dimer 987 EXAM: CT ANGIOGRAPHY CHEST WITH CONTRAST TECHNIQUE: Multidetector CT imaging of the chest was performed using the standard protocol during bolus administration of intravenous contrast. Multiplanar CT image reconstructions and MIPs were obtained to evaluate the vascular anatomy. CONTRAST:  75 mL Isovue 370 COMPARISON:  11/02/2007 FINDINGS: Cardiovascular: Satisfactory opacification of the pulmonary arteries to the segmental level. No evidence of pulmonary embolism. Normal heart size. No  pericardial effusion. Aortic atherosclerosis. Mediastinum/Nodes: No enlarged mediastinal, hilar, or axillary lymph nodes. Thyroid gland, trachea, and esophagus demonstrate no significant findings. Lungs/Pleura: Small bilateral pleural effusions with basilar atelectasis. Interstitial prominence in the lung bases and lung periphery consistent with interstitial edema. Multiple focal nodular ground-glass infiltrates demonstrated in both lungs, likely representing inflammatory process. Largest is in the right upper lung and measures about 12 mm diameter. Airways are patent. No pneumothorax. Upper Abdomen: Small cyst in the lateral segment left lobe of the liver are unchanged since previous study. Surgical absence of the gallbladder. Musculoskeletal: No chest wall abnormality. No acute or significant osseous findings. Review of the MIP images confirms the above findings. IMPRESSION: No evidence of significant pulmonary embolus. Small bilateral pleural effusions with basilar atelectasis and diffuse bilateral peripheral and basilar interstitial edema. **An incidental finding of potential clinical significance has been found. Multiple focal nodular ground-glass opacities in both lungs are nonspecific though probably represent inflammatory or infectious process. Non-contrast chest CT at 3-6 months is recommended. If nodules persist, subsequent management will be based upon the most suspicious nodule(s). This recommendation follows the consensus statement: Guidelines for Management of Incidental Pulmonary Nodules Detected on CT Images: From the Fleischner Society 2017; Radiology 2017; 284:228-243. ** Electronically Signed   By: Lucienne Capers M.D.   On: 06/14/2016 01:07   Dg Chest Port 1 View  Result Date: 06/19/2016 CLINICAL DATA:  Respiratory failure, cardiac dysrhythmia coronary artery disease, mitral regurgitation. Current smoker. EXAM: PORTABLE CHEST 1 VIEW COMPARISON:  Portable chest x-ray of June 17, 2016  FINDINGS: The lungs are hyperinflated with hemidiaphragm flattening. There is no focal infiltrate. There is has been resolution of the pleural effusion on the right with only a trace of fluid remaining. The heart and pulmonary vascularity are normal. There is calcification in the wall of the aortic arch. IMPRESSION: COPD. No pneumonia nor CHF. A trace of pleural fluid persists on the right. Thoracic aortic atherosclerosis. Electronically Signed   By: David  Martinique M.D.   On: 06/19/2016 08:51   Dg Chest Port 1 View  Result Date: 06/17/2016 CLINICAL DATA:  Respiratory failure coronary artery disease, current smoker. EXAM: PORTABLE CHEST 1 VIEW COMPARISON:  Portable chest x-ray of June 15, 2016 FINDINGS: The lungs are well-expanded. Increased density in the right lower lobe has developed since a previous study. Increased volume of the right pleural effusion has occurred. On the left mild lower lobe atelectatic changes have increased and the small left pleural effusion has increased but is less prominent than that on the right. The heart is top-normal in size. The pulmonary vascularity is not engorged. There is calcification in the wall of the aortic arch The mediastinum is normal in width. The bony thorax exhibits no acute abnormality. IMPRESSION: Worsening bibasilar pneumonia or atelectasis with pleural effusions with the greatest changes noted on the right. Underlying COPD. No pulmonary edema. Aortic atherosclerosis. Electronically Signed   By: David  Martinique M.D.   On: 06/17/2016 07:11   Dg Chest Port 1 View  Result Date: 06/13/2016 CLINICAL DATA:  70 y/o F; respiratory distress and feeling she could not breathe. EXAM: PORTABLE CHEST 1 VIEW COMPARISON:  07/15/2013 chest radiograph. FINDINGS: The stable cardiomediastinal silhouette within normal limits given projection and technique. Increased interstitial markings probably represents interstitial edema. Ill-defined opacity right lung base may represent  atelectasis or pneumonia. No acute osseous abnormality is evident. IMPRESSION: Stable cardiac silhouette. Interstitial edema. Ill-defined right basilar opacity may represent atelectasis or pneumonia. Electronically Signed   By: Kristine Garbe M.D.   On: 06/13/2016 23:30   Ct Angio Abd/pel W/ And/or W/o  Result Date: 06/20/2016 CLINICAL DATA:  Aortoiliac obstruction, hypertension EXAM: CTA ABDOMEN AND PELVIS WITH CONTRAST TECHNIQUE: Multidetector CT imaging of the abdomen and pelvis was performed using the standard protocol during bolus administration of intravenous contrast. Multiplanar reconstructed images and MIPs were obtained and reviewed to evaluate the vascular anatomy. CONTRAST:  100 mL Isovue 370 IV COMPARISON:  CT 07/15/2013 FINDINGS: VASCULAR Aorta: Mild atheromatous irregularity with calcified plaque predominately in the infrarenal segment. Some eccentric nonocclusive mural thrombus in the infrarenal segment. No aneurysm, dissection, or stenosis. Celiac: Mild short-segment narrowing at the level of the median arcuate ligament of the diaphragm, patent distally. SMA: Patent without evidence of aneurysm, dissection, vasculitis or significant stenosis. Renals: Duplicated right renal arteries, superior dominant. Bilateral renal arteries are patent without evidence of aneurysm, dissection, vasculitis, fibromuscular dysplasia or significant stenosis. IMA: Patent without evidence of aneurysm, dissection, vasculitis or significant stenosis. Inflow: Nonocclusive calcified plaque throughout the common iliac arteries. Left common iliac ectatic up to 13 mm diameter, right 11 mm. No dissection or stenosis. External iliac arteries unremarkable. Proximal Outflow: Bilateral common femoral and visualized portions of the superficial and profunda femoral arteries are patent without evidence of aneurysm, dissection, vasculitis or significant stenosis. Veins: No obvious venous abnormality within the limitations  of this arterial phase study. Review of the MIP images confirms the above findings. NON-VASCULAR Lower chest: Trace bilateral pleural effusions right greater than left. Dependent subsegmental atelectasis posteriorly in the visualized lower lobes. Hepatobiliary: 18 mm left hepatic cyst as before. No new hepatic lesion. Interval cholecystectomy. Pancreas: Unremarkable. No pancreatic ductal dilatation or surrounding inflammatory changes. Spleen: Normal in size without focal abnormality. Adrenals/Urinary Tract: Adrenal glands are unremarkable. Kidneys are normal, without renal calculi, focal lesion, or hydronephrosis. Bladder is unremarkable. Stomach/Bowel: Stomach is within normal limits. Appendix appears normal. No evidence of bowel wall thickening, distention, or inflammatory changes. Lymphatic: No adenopathy localized. Reproductive: Uterus and bilateral adnexa are unremarkable. Other: No ascites.  No free air. Musculoskeletal: No acute or significant osseous findings. IMPRESSION: VASCULAR 1. No acute findings. 2. Iliac and Aortic Atherosclerosis (ICD10-170.0) without significant stenosis or occlusion. 3. Celiac  axis narrowing at the level of the median arcuate ligament of the diaphragm. NON-VASCULAR 1. Trace pleural effusions right greater than left. 2. Benign left hepatic cyst. Electronically Signed   By: Lucrezia Europe M.D.   On: 06/20/2016 14:23   Disposition   Pt is being discharged home today in good condition.  Follow-up Plans & Appointments   Call [facility] at [phone] if any bleeding, swelling or drainage at cath site.  May shower, no tub baths for 48 hours for groin sticks. No lifting over 5 pounds for 3 days.  No Driving for 3 days  Weigh daily Call [phone] if weight climbs more than 3 pounds in a day or 5 pounds in a week. No salt to very little salt in your diet.  No more than 2000 mg in a day. Call if increased shortness of breath or increased swelling.  IF ANY  INCREASE SOB call Dr. Tyrell Antonio office if moderate to severe go to ER.  You are scheduled for Pulmonary Functions Studies  (PFTs) on Monday go to admitting at [facility] and they will check you in and direct you to correct place.  Be here at 1045 AM this AM   check with Dr. Roxy Manns about Pulmonary follow up though it may be beneficial.   Stop metoprolol  Follow-up [facility] Follow up on 06/23/2016.   Why:  Pulmonary Function Tests at 11:00 am Contact information: [place] [id number] Hopkins, MD Follow up on 06/30/2016.   Specialty:  Cardiothoracic Surgery Why:  at 11:30 AM Contact information: [address] [place] [address] [phone]          Discharge Instructions    Amb Referral to Phase II Cardiac  Rehab    Complete by:  As directed    Diagnosis:   Heart Failure (see criteria below if ordering Phase II) Valve Replacement Valve Repair     Heart Failure Type:  Chronic Systolic   Valve:  Mitral      Discharge Medications   Current Discharge Medication List    CONTINUE these medications which have CHANGED   Details  amiodarone (PACERONE) 200 MG tablet Take 1 tablet (200 mg total) by mouth 2 (two) times daily. Qty: 60 tablet, Refills: 1    diltiazem (CARDIZEM CD) 120 MG 24 hr capsule Take 1 capsule (120 mg total) by mouth daily. Qty: 30 capsule, Refills: 1    furosemide (LASIX) 40 MG tablet Take 1 tablet (40 mg total) by mouth daily. Qty: 30 tablet    potassium chloride SA (K-DUR,KLOR-CON) 20 MEQ tablet Take 1 tablet (20 mEq total) by mouth daily. Qty: 30 tablet, Refills: 1      CONTINUE these medications which have NOT CHANGED   Details  polyethylene glycol (MIRALAX / GLYCOLAX) packet Take 17 g by mouth daily.    acetaminophen (TYLENOL) 325 MG tablet Take 2 tablets (650 mg total) by mouth every 6 (six) hours as needed for mild pain (or Fever >/=  101).    albuterol (PROVENTIL) (2.5 MG/3ML) 0.083% nebulizer solution Inhale 3 mLs (2.5 mg total) into the lungs every 4 (four) hours as needed for wheezing or shortness of breath. Qty: 75 mL, Refills: 12    aspirin 81 MG tablet Take 81 mg by mouth daily. AM    Calcium Carbonate-Vitamin D (CALCIUM-VITAMIN D) 500-200 MG-UNIT per tablet Take 1  tablet by mouth daily. 10 AM    Cholecalciferol (VITAMIN D3) 2000 UNITS TABS Take 1 tablet by mouth daily. 10 AM    tiotropium (SPIRIVA) 18 MCG inhalation capsule Place 1 capsule (18 mcg total) into inhaler and inhale daily. Qty: 30 capsule, Refills: 12         Aspirin prescribed at discharge?  Yes High Intensity Statin Prescribed? (Lipitor 40-80mg  or Crestor 20-40mg ): No: normal coronary arteries Beta Blocker Prescribed? No: no CAD For EF <40%, was ACEI/ARB Prescribed? No: EF 60% ADP Receptor Inhibitor Prescribed? (i.e. Plavix etc.-Includes Medically Managed Patients): No: no CAD and for OHS For EF <40%, Aldosterone Inhibitor Prescribed? No: EF normal Was EF assessed during THIS hospitalization? Yes Was Cardiac Rehab II ordered? (Included Medically managed Patients): Yes   Outstanding Labs/Studies   BMP Latest Ref Rng & Units 06/20/2016 06/19/2016 06/19/2016  Glucose 65 - 99 mg/dL 106(H) - 116(H)  BUN 6 - 20 mg/dL 14 - 21(H)  Creatinine 0.44 - 1.00 mg/dL 0.88 0.97 0.81  BUN/Creat Ratio 11 - 26 - - -  Sodium 135 - 145 mmol/L 139 - 136  Potassium 3.5 - 5.1 mmol/L 3.6 - 3.7  Chloride 101 - 111 mmol/L 105 - 102  CO2 22 - 32 mmol/L 26 - 26  Calcium 8.9 - 10.3 mg/dL 8.8(L) - 8.9   CBC Latest Ref Rng & Units 06/19/2016 06/18/2016 06/17/2016  WBC 4.0 - 10.5 K/uL 11.9(H) 17.8(H) 17.9(H)  Hemoglobin 12.0 - 15.0 g/dL 11.9(L) 14.2 12.9  Hematocrit 36.0 - 46.0 % 35.7(L) 41.2 37.8  Platelets 150 - 400 K/uL 255 239 208   BNP    Component Value Date/Time   BNP 113.3 (H) 06/19/2016 1920   BNP (last 3 results)  Recent Labs  06/13/16 2300  06/19/16 1920  BNP 378.0* 113.3*    Lipid Panel     Component Value Date/Time   CHOL 138 06/16/2016 0410   TRIG 112 06/16/2016 0410   HDL 50 06/16/2016 0410   CHOLHDL 2.8 06/16/2016 0410   VLDL 22 06/16/2016 0410   LDLCALC 66 06/16/2016 0410      Duration of Discharge Encounter   Greater than 30 minutes including physician time.  Signed, Cecilie Kicks NP 06/20/2016, 4:45 PM

## 2016-06-23 ENCOUNTER — Ambulatory Visit (HOSPITAL_COMMUNITY)
Admission: RE | Admit: 2016-06-23 | Discharge: 2016-06-23 | Disposition: A | Discharge status: Home / Self Care | Payer: PPO | Source: Ambulatory Visit | Attending: Thoracic Surgery (Cardiothoracic Vascular Surgery) | Admitting: Thoracic Surgery (Cardiothoracic Vascular Surgery)

## 2016-06-23 ENCOUNTER — Other Ambulatory Visit: Payer: Self-pay | Admitting: *Deleted

## 2016-06-23 ENCOUNTER — Other Ambulatory Visit (HOSPITAL_COMMUNITY): Payer: Self-pay | Admitting: Vascular Surgery

## 2016-06-23 DIAGNOSIS — I340 Nonrheumatic mitral (valve) insufficiency: Secondary | ICD-10-CM | POA: Diagnosis not present

## 2016-06-23 LAB — PULMONARY FUNCTION TEST
DL/VA % PRED: 75 %
DL/VA: 3.08 ml/min/mmHg/L
DLCO cor % pred: 70 %
DLCO cor: 12.39 ml/min/mmHg
DLCO unc % pred: 68 %
DLCO unc: 12.08 ml/min/mmHg
FEF 25-75 Post: 1.8 L/sec
FEF 25-75 Pre: 1.65 L/sec
FEF2575-%CHANGE-POST: 8 %
FEF2575-%Pred-Post: 104 %
FEF2575-%Pred-Pre: 96 %
FEV1-%Change-Post: 2 %
FEV1-%PRED-PRE: 108 %
FEV1-%Pred-Post: 111 %
FEV1-PRE: 2.03 L
FEV1-Post: 2.08 L
FEV1FVC-%CHANGE-POST: 5 %
FEV1FVC-%Pred-Pre: 97 %
FEV6-%CHANGE-POST: -2 %
FEV6-%PRED-PRE: 115 %
FEV6-%Pred-Post: 112 %
FEV6-POST: 2.65 L
FEV6-PRE: 2.72 L
FEV6FVC-%Change-Post: 0 %
FEV6FVC-%PRED-POST: 105 %
FEV6FVC-%PRED-PRE: 105 %
FVC-%CHANGE-POST: -2 %
FVC-%PRED-POST: 106 %
FVC-%PRED-PRE: 109 %
FVC-POST: 2.65 L
FVC-PRE: 2.73 L
POST FEV6/FVC RATIO: 100 %
Post FEV1/FVC ratio: 79 %
Pre FEV1/FVC ratio: 74 %
Pre FEV6/FVC Ratio: 100 %
RV % PRED: 134 %
RV: 2.55 L
TLC % pred: 120 %
TLC: 5.19 L

## 2016-06-23 LAB — BLOOD GAS, ARTERIAL
ACID-BASE EXCESS: 3.5 mmol/L — AB (ref 0.0–2.0)
BICARBONATE: 25.1 mmol/L (ref 20.0–28.0)
Drawn by: 205171
FIO2: 0.21
O2 SAT: 99 %
Patient temperature: 98.6
pCO2 arterial: 23.6 mmHg — ABNORMAL LOW (ref 32.0–48.0)
pH, Arterial: 7.631 (ref 7.350–7.450)
pO2, Arterial: 108 mmHg (ref 83.0–108.0)

## 2016-06-23 MED ORDER — ALBUTEROL SULFATE (2.5 MG/3ML) 0.083% IN NEBU
2.5000 mg | INHALATION_SOLUTION | Freq: Once | RESPIRATORY_TRACT | Status: AC
  Administered 2016-06-23: 2.5 mg via RESPIRATORY_TRACT

## 2016-06-23 NOTE — Telephone Encounter (Signed)
Spoke with to schedule PFT and remind pt of f/u appt. She states she just had an CXR . States she will come and see DS but will decide if she will do another CXR after she is seen. Nothing further needed at this time.

## 2016-06-30 ENCOUNTER — Other Ambulatory Visit (HOSPITAL_COMMUNITY): Payer: Self-pay | Admitting: General Practice

## 2016-06-30 ENCOUNTER — Encounter: Payer: Self-pay | Admitting: Thoracic Surgery (Cardiothoracic Vascular Surgery)

## 2016-06-30 ENCOUNTER — Ambulatory Visit (HOSPITAL_COMMUNITY)
Admission: RE | Admit: 2016-06-30 | Discharge: 2016-06-30 | Disposition: A | Discharge status: Home / Self Care | Payer: PPO | Source: Ambulatory Visit | Attending: Thoracic Surgery (Cardiothoracic Vascular Surgery) | Admitting: Thoracic Surgery (Cardiothoracic Vascular Surgery)

## 2016-06-30 ENCOUNTER — Encounter (HOSPITAL_COMMUNITY)
Admission: RE | Admit: 2016-06-30 | Discharge: 2016-06-30 | Disposition: A | Discharge status: Home / Self Care | Payer: PPO | Source: Ambulatory Visit | Attending: Thoracic Surgery (Cardiothoracic Vascular Surgery) | Admitting: Thoracic Surgery (Cardiothoracic Vascular Surgery)

## 2016-06-30 ENCOUNTER — Encounter (HOSPITAL_COMMUNITY): Payer: PPO

## 2016-06-30 ENCOUNTER — Encounter (HOSPITAL_COMMUNITY): Payer: Self-pay

## 2016-06-30 ENCOUNTER — Ambulatory Visit (INDEPENDENT_AMBULATORY_CARE_PROVIDER_SITE_OTHER): Payer: PPO | Admitting: Thoracic Surgery (Cardiothoracic Vascular Surgery)

## 2016-06-30 VITALS — BP 102/66 | HR 77 | Resp 20 | Ht 59.0 in | Wt 116.0 lb

## 2016-06-30 DIAGNOSIS — I511 Rupture of chordae tendineae, not elsewhere classified: Secondary | ICD-10-CM | POA: Diagnosis not present

## 2016-06-30 DIAGNOSIS — E8770 Fluid overload, unspecified: Secondary | ICD-10-CM | POA: Diagnosis not present

## 2016-06-30 DIAGNOSIS — J440 Chronic obstructive pulmonary disease with acute lower respiratory infection: Secondary | ICD-10-CM | POA: Diagnosis not present

## 2016-06-30 DIAGNOSIS — I440 Atrioventricular block, first degree: Secondary | ICD-10-CM | POA: Diagnosis not present

## 2016-06-30 DIAGNOSIS — R001 Bradycardia, unspecified: Secondary | ICD-10-CM | POA: Diagnosis not present

## 2016-06-30 DIAGNOSIS — Z803 Family history of malignant neoplasm of breast: Secondary | ICD-10-CM | POA: Diagnosis not present

## 2016-06-30 DIAGNOSIS — I2510 Atherosclerotic heart disease of native coronary artery without angina pectoris: Secondary | ICD-10-CM | POA: Diagnosis not present

## 2016-06-30 DIAGNOSIS — Z820 Family history of epilepsy and other diseases of the nervous system: Secondary | ICD-10-CM | POA: Diagnosis not present

## 2016-06-30 DIAGNOSIS — Z7901 Long term (current) use of anticoagulants: Secondary | ICD-10-CM | POA: Diagnosis not present

## 2016-06-30 DIAGNOSIS — I340 Nonrheumatic mitral (valve) insufficiency: Secondary | ICD-10-CM

## 2016-06-30 DIAGNOSIS — I509 Heart failure, unspecified: Secondary | ICD-10-CM | POA: Diagnosis not present

## 2016-06-30 DIAGNOSIS — R0602 Shortness of breath: Secondary | ICD-10-CM | POA: Diagnosis not present

## 2016-06-30 DIAGNOSIS — R05 Cough: Secondary | ICD-10-CM | POA: Diagnosis not present

## 2016-06-30 DIAGNOSIS — I272 Pulmonary hypertension, unspecified: Secondary | ICD-10-CM | POA: Diagnosis not present

## 2016-06-30 DIAGNOSIS — F17210 Nicotine dependence, cigarettes, uncomplicated: Secondary | ICD-10-CM | POA: Diagnosis not present

## 2016-06-30 DIAGNOSIS — J9811 Atelectasis: Secondary | ICD-10-CM | POA: Diagnosis not present

## 2016-06-30 DIAGNOSIS — I058 Other rheumatic mitral valve diseases: Secondary | ICD-10-CM | POA: Diagnosis not present

## 2016-06-30 DIAGNOSIS — I5032 Chronic diastolic (congestive) heart failure: Secondary | ICD-10-CM | POA: Diagnosis not present

## 2016-06-30 DIAGNOSIS — Z4682 Encounter for fitting and adjustment of non-vascular catheter: Secondary | ICD-10-CM | POA: Diagnosis not present

## 2016-06-30 DIAGNOSIS — Z01818 Encounter for other preprocedural examination: Secondary | ICD-10-CM

## 2016-06-30 DIAGNOSIS — R791 Abnormal coagulation profile: Secondary | ICD-10-CM | POA: Diagnosis not present

## 2016-06-30 DIAGNOSIS — I442 Atrioventricular block, complete: Secondary | ICD-10-CM | POA: Diagnosis not present

## 2016-06-30 DIAGNOSIS — Z823 Family history of stroke: Secondary | ICD-10-CM | POA: Diagnosis not present

## 2016-06-30 DIAGNOSIS — I493 Ventricular premature depolarization: Secondary | ICD-10-CM | POA: Diagnosis not present

## 2016-06-30 DIAGNOSIS — I110 Hypertensive heart disease with heart failure: Secondary | ICD-10-CM | POA: Diagnosis not present

## 2016-06-30 DIAGNOSIS — D6959 Other secondary thrombocytopenia: Secondary | ICD-10-CM | POA: Diagnosis not present

## 2016-06-30 DIAGNOSIS — I4891 Unspecified atrial fibrillation: Secondary | ICD-10-CM | POA: Diagnosis not present

## 2016-06-30 DIAGNOSIS — D62 Acute posthemorrhagic anemia: Secondary | ICD-10-CM | POA: Diagnosis not present

## 2016-06-30 DIAGNOSIS — Z7982 Long term (current) use of aspirin: Secondary | ICD-10-CM | POA: Diagnosis not present

## 2016-06-30 DIAGNOSIS — I081 Rheumatic disorders of both mitral and tricuspid valves: Secondary | ICD-10-CM | POA: Diagnosis not present

## 2016-06-30 DIAGNOSIS — I471 Supraventricular tachycardia: Secondary | ICD-10-CM | POA: Diagnosis not present

## 2016-06-30 HISTORY — DX: Heart failure, unspecified: I50.9

## 2016-06-30 HISTORY — DX: Personal history of pneumonia (recurrent): Z87.01

## 2016-06-30 HISTORY — DX: Personal history of other diseases of the respiratory system: Z87.09

## 2016-06-30 LAB — COMPREHENSIVE METABOLIC PANEL
ALT: 13 U/L — ABNORMAL LOW (ref 14–54)
ANION GAP: 11 (ref 5–15)
AST: 22 U/L (ref 15–41)
Albumin: 3.5 g/dL (ref 3.5–5.0)
Alkaline Phosphatase: 60 U/L (ref 38–126)
BILIRUBIN TOTAL: 0.2 mg/dL — AB (ref 0.3–1.2)
BUN: 8 mg/dL (ref 6–20)
CHLORIDE: 104 mmol/L (ref 101–111)
CO2: 23 mmol/L (ref 22–32)
Calcium: 9.1 mg/dL (ref 8.9–10.3)
Creatinine, Ser: 0.98 mg/dL (ref 0.44–1.00)
GFR, EST NON AFRICAN AMERICAN: 58 mL/min — AB (ref >60)
Glucose, Bld: 116 mg/dL — ABNORMAL HIGH (ref 65–99)
POTASSIUM: 3.6 mmol/L (ref 3.5–5.1)
Sodium: 138 mmol/L (ref 135–145)
TOTAL PROTEIN: 6.1 g/dL — AB (ref 6.5–8.1)

## 2016-06-30 LAB — PROTIME-INR
INR: 0.99
PROTHROMBIN TIME: 13.1 s (ref 11.4–15.2)

## 2016-06-30 LAB — URINALYSIS, ROUTINE W REFLEX MICROSCOPIC
BILIRUBIN URINE: NEGATIVE
GLUCOSE, UA: NEGATIVE mg/dL
HGB URINE DIPSTICK: NEGATIVE
KETONES UR: NEGATIVE mg/dL
Leukocytes, UA: NEGATIVE
NITRITE: NEGATIVE
PH: 7 (ref 5.0–8.0)
Protein, ur: NEGATIVE mg/dL
SPECIFIC GRAVITY, URINE: 1.015 (ref 1.005–1.030)

## 2016-06-30 LAB — BLOOD GAS, ARTERIAL
ACID-BASE EXCESS: 4.3 mmol/L — AB (ref 0.0–2.0)
BICARBONATE: 27 mmol/L (ref 20.0–28.0)
DRAWN BY: 421801
FIO2: 21
O2 Saturation: 98.2 %
PH ART: 7.551 — AB (ref 7.350–7.450)
PO2 ART: 95 mmHg (ref 83.0–108.0)
Patient temperature: 98.6
pCO2 arterial: 30.8 mmHg — ABNORMAL LOW (ref 32.0–48.0)

## 2016-06-30 LAB — CBC
HEMATOCRIT: 35.5 % — AB (ref 36.0–46.0)
Hemoglobin: 11.9 g/dL — ABNORMAL LOW (ref 12.0–15.0)
MCH: 30.7 pg (ref 26.0–34.0)
MCHC: 33.5 g/dL (ref 30.0–36.0)
MCV: 91.5 fL (ref 78.0–100.0)
PLATELETS: 302 10*3/uL (ref 150–400)
RBC: 3.88 MIL/uL (ref 3.87–5.11)
RDW: 12.8 % (ref 11.5–15.5)
WBC: 10.5 10*3/uL (ref 4.0–10.5)

## 2016-06-30 LAB — SURGICAL PCR SCREEN
MRSA, PCR: NEGATIVE
STAPHYLOCOCCUS AUREUS: NEGATIVE

## 2016-06-30 LAB — APTT: APTT: 30 s (ref 24–36)

## 2016-06-30 LAB — ABO/RH: ABO/RH(D): O NEG

## 2016-06-30 NOTE — Progress Notes (Signed)
PCP - Dr. Tracie Harrier Cardiologist - Dr. Genia Del  EKG - 06/15/16 CXR - 06/30/16  Echo - 06/2016 Stress test - 07/2014 Cardiac Cath - 06/19/16  Patient denies chest pain and shortness of breath at PAT appointment.

## 2016-06-30 NOTE — H&P (Signed)
[address]       [place] [address]             [phone]          CARDIOTHORACIC SURGERY HISTORY AND PHYSICAL EXAM  PCP is HANDE,VISHWANATH, MD Referring Provider is ARIDA, [facility] A, MD  Reason for consultation:  Severe mitral regurgitation  HPI:  Patient is a 70 year old female with history of mitral valve prolapse causing mitral regurgitation, palpitations with frequent PVCs, long-standing symptoms of exertional shortness of breath, and long-standing tobacco abuse with COPD who was transferred from [facility] to [facility] for evaluation of severe mitral regurgitation.  The patient states that she first began to experience symptoms of exertional shortness of breath and palpitations nearly 5 years ago. She was noted to have mitral valve prolapse and initially was evaluated by Dr. Ubaldo Glassing.  Symptoms progressed and she was referred to Dr. Fletcher Anon who has been following her since December 2015.  At that time the patient complained of significant exertional shortness of breath and frequent palpitations. Echocardiogram performed at that time revealed normal left ventricular systolic function with mitral valve prolapse and "moderate to severe" mitral regurgitation.  A nuclear stress test was performed and felt to be low risk for myocardial ischemia. She was started on metoprolol and symptoms of palpitations improved. The patient has been followed intermittently ever since by Dr. Fletcher Anon.  A Holter monitor was performed documenting frequent PVCs. The patient states that over the past year she has developed further progression of symptoms of exertional shortness of breath and fatigue with increasing episodes of palpitations.  She was seen in follow-up recently by Dr. Fletcher Anon and a follow-up echocardiogram was performed 06/05/2016. This revealed normal left ventricular size and systolic function with posterior leaflet prolapse and "moderate eccentric regurgitation".  Arrangements for were made for a repeat Holter monitor because of worsening symptoms of palpitations.    The patient states that symptoms of shortness of breath continued to progress to the point where she began to experience trouble breathing even at rest. She presented acutely to the emergency department at [facility] on 06/14/2016 with resting shortness of breath with baseline oxygen saturation was 88% on room air.  She also complained of cough and baseline white blood count was elevated at 20,400. The patient was admitted to the hospital and placed on BiPAP with symptomatic improvement. CT scan of the chest was performed to rule out pulmonary embolism but was notable for the presence of interstitial edema and groundglass opacities suspicious for possible pneumonia.  She was treated with intravenous antibiotics, steroids and nebulizers for presumed community acquired pneumonia and bronchitis.  Following admission she had a brief episode of supraventricular tachycardia which converted to sinus rhythm following adenosine administration.  Cardiology consultation was obtained and the patient was started on diuretic therapy.  The patient gradually improved over the next several days. She underwent transesophageal echocardiogram 06/18/2016 confirmed the presence of mitral valve prolapse with flail segment of posterior leaflet and severe mitral regurgitation.  Left and right heart catheterization was performed 06/19/2016 and notable for the absence of significant coronary artery disease with severe mitral regurgitation and mildly elevated pulmonary artery pressures. The patient was transferred to [facility] for further management.    She was originally seen in consultation on 06/20/2016 during her recent hospitalization. At that time she was recovering from acute pneumonia that was complicated by acute exacerbation of chronic diastolic congestive heart failure.  She  now returns to our office for  follow-up with tentative plans to proceed with elective mitral valve repair later this week.  She states that she has done well since hospital discharge. Specifically, she has not had problems with shortness of breath. She states that her breathing is essentially back to her baseline and how it has been 4 the last couple of years. She has not had fevers, chills, or productive cough. She has not had palpitations or dizzy spells. She has not been active physically. Appetite has been marginal.  The patient is married and lives with her husband in [place], [place]. She works full-time as a Statistician. She has a long-standing history of tobacco abuse smoking greater than 1 pack of cigarettes daily for many years, more recently smoking half pack of cigarettes daily. She describes a several year history of progressive symptoms of exertional shortness of breath and fatigue. Symptoms have reportedly got much worse over the past year. Onset of symptoms has been slow and gradual without any recent acute changes. Prior to hospital admission she reports symptoms of orthopnea with frequent palpitations. She has not had lower extremity edema. She gets occasional episodes of tightness across her chest typically in the setting of more severe shortness of breath and palpitations. She has not had dizzy spells or syncope. Since hospital admission nearly 1 week ago the patient's breathing has improved dramatically and she now reports feeling comfortable breathing on room air. She denies any fevers, chills, or productive cough. Appetite is fair. Remainder for review of systems is noncontributory.     Past Medical History:  Diagnosis Date  . CHF (congestive heart failure) ([place])    per patient  . COPD (chronic obstructive pulmonary disease) ([place])    pt. denies  . History of bronchitis   . History of pneumonia   . Incidental pulmonary nodule 06/13/2016   Multiple small ground glass opacities seen on chest CT scan  .  Migraine    1x/month - uses Aleve  . Normal coronary arteries    a. cardiac cath 06/19/2016: normal coronary arteries and left dominant system, LVEDP nl, 4+ mitral regurgitation, RHC showed mildly elevated LVEDP (36mmHg), giant V waves noted on PCWP w/ mild pulmonary HTN., pulmonary pressure 39/10 with a mean of 27 mmHg, mildly reduced cardiac output at 3.1 with a cardiac index of 2.09, recommned mitral valve repair, acceptable volume status  . Paroxysmal SVT (supraventricular tachycardia) ([place])   . PVC's (premature ventricular contractions)    a. Holter 2015 with 26,000 PVCs in 48 hours  . Severe mitral regurgitation    a. TTE 06/05/16: EF 60-65%, nl WM, LV dias fxn nl, mod prolapse of posterior mitral leaflet with mod eccentric regurg, LA mildly dilated, PASP nl; b. TEE 06/18/16: EF 65-70%, LV mildly dilated, no valvular vegetations seen throughout, mitral valve w/ flail motion involving the middle scallop of the post leaflet. Severe regurgitation directed eccentrically, LA mildly dilated  . Shortness of breath dyspnea    1 flight stairs - winded  . Tobacco abuse   . Vertigo    no meds    Past Surgical History:  Procedure Laterality Date  . CARDIAC CATHETERIZATION N/A 06/19/2016   Procedure: Right/Left Heart Cath and Coronary Angiography;  Surgeon: Wellington Hampshire, MD;  Location: [facility];  Service: Cardiovascular;  Laterality: N/A;  . CESAREAN SECTION    . CHOLECYSTECTOMY    . COLONOSCOPY N/A 12/07/2014   Procedure: COLONOSCOPY;  Surgeon: Lucilla Lame, MD;  Location: [facility];  Service: Gastroenterology;  Laterality: N/A;  . ESOPHAGOGASTRODUODENOSCOPY N/A 12/07/2014   Procedure: ESOPHAGOGASTRODUODENOSCOPY (EGD);  Surgeon: Lucilla Lame, MD;  Location: [facility];  Service: Gastroenterology;  Laterality: N/A;  . EYE SURGERY Bilateral    lasic  . TEE WITHOUT CARDIOVERSION N/A 06/18/2016   Procedure: TRANSESOPHAGEAL ECHOCARDIOGRAM (TEE);  Surgeon: Nelva Bush, MD;  Location: [facility];  Service: Cardiovascular;  Laterality: N/A;  . TONSILLECTOMY      Family History  Problem Relation Age of Onset  . Stroke Father   . Alzheimer's disease Mother   . Cancer Brother   . Cancer Brother   . Cancer Sister     breast  . Breast cancer Sister 61  . Cancer Sister     breast    Social History Social History  Substance Use Topics  . Smoking status: Current Every Day Smoker    Packs/day: 0.50    Years: 45.00    Types: Cigarettes  . Smokeless tobacco: Never Used     Comment: quit date 06/13/16  . Alcohol use 0.0 oz/week     Comment: rarely    Prior to Admission medications   Medication Sig Start Date End Date Taking? Authorizing Provider  acetaminophen (TYLENOL) 325 MG tablet Take 2 tablets (650 mg total) by mouth every 6 (six) hours as needed for mild pain (or Fever >/= 101). 06/19/16   Loletha Grayer, MD  amiodarone (PACERONE) 200 MG tablet Take 1 tablet (200 mg total) by mouth 2 (two) times daily. 06/20/16   Isaiah Serge, NP  aspirin 81 MG tablet Take 81 mg by mouth daily. AM    Historical Provider, MD  Calcium Carbonate-Vitamin D (CALCIUM-VITAMIN D) 500-200 MG-UNIT per tablet Take 1 tablet by mouth daily. 10 AM    Historical Provider, MD  Cholecalciferol (VITAMIN D3) 2000 UNITS TABS Take 1 tablet by mouth daily. 10 AM    Historical Provider, MD  diltiazem (CARDIZEM CD) 120 MG 24 hr capsule Take 1 capsule (120 mg total) by mouth daily. 06/20/16   Isaiah Serge, NP  furosemide (LASIX) 40 MG tablet Take 1 tablet (40 mg total) by mouth daily. 06/20/16   Isaiah Serge, NP  potassium chloride SA (K-DUR,KLOR-CON) 20 MEQ tablet Take 1 tablet (20 mEq total) by mouth daily. 06/20/16   Isaiah Serge, NP    No Known Allergies   Review of Systems:              General:                      normal appetite, normal energy, no weight gain, no weight loss, no fever             Cardiac:                       no chest pain with exertion,  occasional chest pain at rest, + SOB with exertion, + resting SOB, + PND, + orthopnea, + palpitations, + arrhythmia, no atrial fibrillation, no LE edema, no dizzy spells, no syncope             Respiratory:                 + shortness of breath, no home oxygen, + productive cough, no dry cough, + bronchitis, no wheezing, no hemoptysis, no asthma, no pain with inspiration or cough, no sleep apnea, no CPAP at night  GI:                               no difficulty swallowing, no reflux, no frequent heartburn, no hiatal hernia, no abdominal pain, no constipation, no diarrhea, no hematochezia, no hematemesis, no melena             GU:                              no dysuria,  no frequency, no urinary tract infection, no hematuria, no kidney stones, no kidney disease             Vascular:                     no pain suggestive of claudication, no pain in feet, no leg cramps, no varicose veins, no DVT, no non-healing foot ulcer             Neuro:                         no stroke, no TIA's, no seizures, no headaches, no temporary blindness one eye,  no slurred speech, no peripheral neuropathy, no chronic pain, no instability of gait, no memory/cognitive dysfunction             Musculoskeletal:         no arthritis, no joint swelling, no myalgias, no difficulty walking, normal mobility              Skin:                            n rash, no itching, no skin infections, no pressure sores or ulcerations             Psych:                         no anxiety, no depression, no nervousness, no unusual recent stress             Eyes:                           no blurry vision, no floaters, no recent vision changes, + wears glasses for reading             ENT:                            no hearing loss, no loose or painful teeth, no dentures but multiple implants, last saw dentist 4 months ago             Hematologic:               no easy bruising, no abnormal bleeding, no clotting disorder, no frequent  epistaxis             Endocrine:                   no diabetes, does not check CBG's at home                           Physical Exam:  BP (!) 110/53 (BP Location: Left Arm)   Pulse 76   Temp 98.1 F (36.7 C) (Oral)   Resp 18   Ht 4\' 11"  (1.499 m)   Wt 116 lb 9.6 oz (52.9 kg)   SpO2 96%   BMI 23.55 kg/m              General:                      Thin,  well-appearing             HEENT:                       Unremarkable              Neck:                           no JVD, no bruits, no adenopathy              Chest:                          clear to auscultation, symmetrical breath sounds, no wheezes, no rhonchi              CV:                              RRR, grade IV/VI holosystolic murmur              Abdomen:                    soft, non-tender, no masses              Extremities:                 warm, well-perfused, pulses diminished but palpable, no lower extremity edema             Rectal/GU                   Deferred             Neuro:                         Grossly non-focal and symmetrical throughout             Skin:                            Clean and dry, no rashes, no breakdown  Diagnostic Tests:  Transthoracic Echocardiography  Patient:  Nicole, Norman MR #:    [id number] Study Date: 07/20/2014 Gender:   F Age:    89 Height:   152.4 cm Weight:   52.6 kg BSA:    1.5 m^2 Pt. Status: Room:  ATTENDING  Kathlyn Sacramento, MD ORDERING   Kathlyn Sacramento, MD REFERRING  Kathlyn Sacramento, MD SONOGRAPHER Timmothy Sours, RDCS, RVT PERFORMING  [facility] [place]  cc:  ------------------------------------------------------------------- LV EF: 60% -  65%  ------------------------------------------------------------------- History:  PMH: Acquired from the patient and from the patient&'s chart. Chest pain. Palpitations and dyspnea. Risk factors: Current tobacco  use.  ------------------------------------------------------------------- Study Conclusions  - Left ventricle: The cavity size was normal. Systolic function was normal. The estimated ejection fraction was in the range of 60%  to 65%. Wall motion was normal; there were no regional wall motion abnormalities. Left ventricular diastolic function parameters were normal. - Mitral valve: moderate mitral valve prolapse of posterior leaflet. There was moderate to severe regurgitation. - Right ventricle: Systolic function was normal. - Pulmonary arteries: Systolic pressure was within the normal range.  Transthoracic echocardiography. M-mode, complete 2D, spectral Doppler, and color Doppler. Birthdate: Patient birthdate: 06/21/1946. Age: Patient is 70 yr old. Sex: Gender: female. BMI: 22.7 kg/m^2. Blood pressure:   120/70 Patient status: Outpatient. Study date: Study date: 07/20/2014. Study time: 02:50 PM.  -------------------------------------------------------------------  ------------------------------------------------------------------- Left ventricle: The cavity size was normal. Systolic function was normal. The estimated ejection fraction was in the range of 60% to 65%. Wall motion was normal; there were no regional wall motion abnormalities. The transmitral flow pattern was normal. The deceleration time of the early transmitral flow velocity was normal. The pulmonary vein flow pattern was normal. The tissue Doppler parameters were normal. Left ventricular diastolic function parameters were normal.  ------------------------------------------------------------------- Aortic valve:  Trileaflet; normal thickness leaflets. Mobility was not restricted. Doppler: Transvalvular velocity was within the normal range. There was no stenosis. There was no regurgitation.  ------------------------------------------------------------------- Aorta: Aortic root: The  aortic root was normal in size.  ------------------------------------------------------------------- Mitral valve: Poorly visualized. moderate mitral valve prolapse of posterior leaflet. Mobility was not restricted. Doppler: Transvalvular velocity was within the normal range. There was no evidence for stenosis. There was moderate to severe regurgitation. Peak gradient (D): 4 mm Hg.  ------------------------------------------------------------------- Left atrium: The atrium was normal in size.  ------------------------------------------------------------------- Right ventricle: The cavity size was normal. Wall thickness was normal. Systolic function was normal.  ------------------------------------------------------------------- Pulmonic valve:  Structurally normal valve.  Cusp separation was normal. Doppler: Transvalvular velocity was within the normal range. There was no evidence for stenosis. There was no regurgitation.  ------------------------------------------------------------------- Tricuspid valve:  Structurally normal valve.  Doppler: Transvalvular velocity was within the normal range. There was trivial regurgitation.  ------------------------------------------------------------------- Pulmonary artery:  The main pulmonary artery was normal-sized. Systolic pressure was within the normal range.  ------------------------------------------------------------------- Right atrium: The atrium was normal in size.  ------------------------------------------------------------------- Pericardium: There was no pericardial effusion.  ------------------------------------------------------------------- Systemic veins: Inferior vena cava: The vessel was normal in size.  ------------------------------------------------------------------- Measurements  Left ventricle               Value    Reference LV ID, ED, PLAX chordal            49.6 mm   43 - 52 LV ID, ES, PLAX chordal           28.8 mm   23 - 38 LV fx shortening, PLAX chordal       42  %   >=29 LV PW thickness, ED             11  mm   --------- IVS/LV PW ratio, ED             0.78     <=1.3 LV ejection fraction, 1-p A4C        48  %   --------- LV end-diastolic volume, 2-p        92  ml   --------- LV end-systolic volume, 2-p         41  ml   --------- LV ejection fraction, 2-p          55  %   --------- Stroke volume, 2-p  51  ml   --------- LV end-diastolic volume/bsa, 2-p      61  ml/m^2 --------- LV end-systolic volume/bsa, 2-p       27  ml/m^2 --------- Stroke volume/bsa, 2-p           34  ml/m^2 ---------  Ventricular septum             Value    Reference IVS thickness, ED              8.53 mm   ---------  Aorta                    Value    Reference Aortic root ID, ED             29  mm   ---------  Left atrium                 Value    Reference LA ID, A-P, ES               37  mm   --------- LA ID/bsa, A-P          (H)    2.47 cm/m^2 <=2.2 LA volume, S                34  ml   --------- LA volume/bsa, S              22.7 ml/m^2 ---------  Mitral valve                Value    Reference Mitral E-wave peak velocity         97.2 cm/s  --------- Mitral A-wave peak velocity         79  cm/s  --------- Mitral deceleration time     (H)    335  ms   150 - 230 Mitral peak gradient, D           4   mm Hg --------- Mitral E/A ratio, peak           1.2     --------- Mitral maximal regurg velocity,       549  cm/s   --------- PISA Mitral regurg VTI, PISA           181  cm   --------- Mitral ERO, PISA              0.38 cm^2  --------- Mitral regurg volume, PISA         69  ml   ---------  Right ventricle               Value    Reference RV ID, ED, PLAX               32.8 mm   19 - 38  Legend: (L) and (H) mark values outside specified reference range.  ------------------------------------------------------------------- Prepared and Electronically Authenticated by  Esmond Plants, MD, [facility] 2015-12-17T17:35:28   Transthoracic Echocardiography  Patient: Nicole, Norman MR #: [id number] Study Date: 06/05/2016 Gender: F Age: 33 Height: 149.9 cm Weight: 54.7 kg BSA: 1.52 m^2 Pt. Status: Room:  ATTENDING Default, Provider [phone] Candelaria Stagers, MD [place], MD PERFORMING [place], [place] SONOGRAPHER Pilar Jarvis, RVT, RDCS, RDMS  cc:  ------------------------------------------------------------------- LV EF: 60% - 65%  ------------------------------------------------------------------- History: PMH: Lightheadedness and dyspnea. Mitral valve disease. Mitral valve prolapse. Risk factors: Current tobacco use.  ------------------------------------------------------------------- Study Conclusions  - Left ventricle: The cavity  size was normal. Systolic function was normal. The estimated ejection fraction was in the range of 60% to 65%. Wall motion was normal; there were no regional wall motion abnormalities. Left ventricular diastolic function parameters were normal. - Mitral valve: Moderate prolapse of the posterior leaflet. There was moderate eccentric regurgitation. - Left atrium: The atrium was mildly dilated. - Right ventricle: Systolic function was normal. - Pulmonary arteries: Systolic  pressure was within the normal range.  ------------------------------------------------------------------- Labs, prior tests, procedures, and surgery: ECG. Abnormal.  ------------------------------------------------------------------- Study data: The previous study was not available, so comparison was made to the report of December 2015. Study status: Routine. Procedure: Transthoracic echocardiography. Image quality was good. Study completion: There were no complications. Transthoracic echocardiography. M-mode, complete 2D, spectral Doppler, and color Doppler. Birthdate: Patient birthdate: 1945/11/11. Age: Patient is 70 yr old. Sex: Gender: female. BMI: 24.3 kg/m^2. Blood pressure: 102/60 Patient status: Outpatient. Study date: Study date: 06/05/2016. Study time: 10:57 AM.  -------------------------------------------------------------------  ------------------------------------------------------------------- Left ventricle: The cavity size was normal. Systolic function was normal. The estimated ejection fraction was in the range of 60% to 65%. Wall motion was normal; there were no regional wall motion abnormalities. The transmitral flow pattern was normal. The deceleration time of the early transmitral flow velocity was normal. The pulmonary vein flow pattern was normal. The tissue Doppler parameters were normal. Left ventricular diastolic function parameters were normal.  ------------------------------------------------------------------- Aortic valve: Trileaflet; normal thickness leaflets. Mobility was not restricted. Doppler: Transvalvular velocity was within the normal range. There was no stenosis. There was no regurgitation.  ------------------------------------------------------------------- Aorta: Aortic root: The aortic root was normal in size.  ------------------------------------------------------------------- Mitral valve:  Mobility was not restricted. Moderate prolapse of the posterior leaflet. Doppler: Transvalvular velocity was within the normal range. There was no evidence for stenosis. There was moderate eccentric regurgitation. Peak gradient (D): 7 mm Hg.  ------------------------------------------------------------------- Left atrium: The atrium was mildly dilated.  ------------------------------------------------------------------- Right ventricle: The cavity size was normal. Wall thickness was normal. Systolic function was normal.  ------------------------------------------------------------------- Pulmonic valve: Structurally normal valve. Cusp separation was normal. Doppler: Transvalvular velocity was within the normal range. There was no evidence for stenosis. There was no regurgitation.  ------------------------------------------------------------------- Tricuspid valve: Structurally normal valve. Doppler: Transvalvular velocity was within the normal range. There was no regurgitation.  ------------------------------------------------------------------- Pulmonary artery: The main pulmonary artery was normal-sized. Systolic pressure was within the normal range.  ------------------------------------------------------------------- Right atrium: The atrium was normal in size.  ------------------------------------------------------------------- Pericardium: There was no pericardial effusion.  ------------------------------------------------------------------- Systemic veins: Inferior vena cava: The vessel was normal in size.  ------------------------------------------------------------------- Measurements  Left ventricle Value Reference LV ID, ED, PLAX chordal 49.7 mm 43 - 52 LV ID, ES, PLAX chordal 34.4 mm 23 - 38 LV fx shortening, PLAX chordal 31 % >=29 LV  PW thickness, ED 11.4 mm --------- IVS/LV PW ratio, ED 0.84 <=1.3 Stroke volume, 2D 51 ml --------- Stroke volume/bsa, 2D 34 ml/m^2 --------- LV ejection fraction, 1-p A4C 51 % --------- LV e&', lateral 3.92 cm/s --------- LV E/e&', lateral 33.67 --------- LV e&', medial 5.77 cm/s --------- LV E/e&', medial 22.88 --------- LV e&', average 4.85 cm/s --------- LV E/e&', average 27.24 ---------  Ventricular septum Value Reference IVS thickness, ED 9.62 mm ---------  LVOT Value Reference LVOT ID, S 20 mm --------- LVOT area 3.14 cm^2 --------- LVOT ID 20 mm --------- LVOT peak velocity, S 80.8 cm/s --------- LVOT mean velocity, S 52 cm/s --------- LVOT VTI, S 16.4 cm --------- Stroke volume (SV), LVOT DP  51.5 ml --------- Stroke index (SV/bsa), LVOT DP 33.9 ml/m^2 ---------  Aorta Value Reference Aortic root ID, ED 28 mm --------- Ascending aorta ID, A-P, S 31 mm ---------  Left atrium Value Reference LA ID, A-P, ES 40 mm --------- LA ID/bsa, A-P (H) 2.63 cm/m^2 <=2.2 LA volume, S 63.4 ml --------- LA volume/bsa, S 41.7 ml/m^2  --------- LA volume, ES, 1-p A4C 66.2 ml --------- LA volume/bsa, ES, 1-p A4C 43.5 ml/m^2 --------- LA volume, ES, 1-p A2C 54.6 ml --------- LA volume/bsa, ES, 1-p A2C 35.9 ml/m^2 ---------  Mitral valve Value Reference Mitral E-wave peak velocity 132 cm/s --------- Mitral A-wave peak velocity 57.4 cm/s --------- Mitral deceleration time 190 ms 150 - 230 Mitral peak gradient, D 7 mm Hg --------- Mitral E/A ratio, peak 2.3 ---------  Right ventricle Value Reference TAPSE 23 mm ---------  Pulmonic valve Value Reference Pulmonic valve peak velocity, S 51.4 cm/s ---------  Legend: (L) and (H) mark values outside specified reference range.  ------------------------------------------------------------------- Prepared and Electronically Authenticated by  Esmond Plants, MD, [facility] 2017-11-02T19:25:40    Transesophageal Echocardiography  Patient: Nicole, Norman MR #: [id number] Study Date: 06/18/2016 Gender: F Age: 63 Height: 149.9 cm Weight: 54.5 kg BSA: 1.52 m^2 Pt. Status: Room:  ATTENDING Daun Peacock, Ryan M REFERRING Dunn, Ryan M ADMITTING Harrie Foreman PERFORMING [facility] SONOGRAPHER Mikki Santee  cc:  ------------------------------------------------------------------- LV EF: 65% - 70%  ------------------------------------------------------------------- Indications: Mitral regurgitation 424.0.  ------------------------------------------------------------------- History: PMH: Dyspnea. Coronary artery disease. Risk  factors: Current tobacco use.  ------------------------------------------------------------------- Study Conclusions  - Left ventricle: The cavity size was mildly dilated. Systolic function was vigorous. The estimated ejection fraction was in the range of 65% to 70%. - Aortic valve: No evidence of vegetation. - Mitral valve: Flail motion involving the middle scallop of the posterior leaflet. There was severe regurgitation directed eccentrically. Severe regurgitation is suggested by pulmonary vein systolic flow reversal and a vena contracta width >= 6 mm. - Left atrium: The atrium was dilated. No evidence of thrombus in the atrial cavity or appendage. - Right atrium: No evidence of thrombus in the atrial cavity or appendage. - Tricuspid valve: No evidence of vegetation. - Pulmonic valve: No evidence of vegetation.  Impressions:  - Flail posterior leaflet of the mitral valve with severe, eccentric regurgitation.  ------------------------------------------------------------------- Study data: Study status: Routine. Consent: The risks, benefits, and alternatives to the procedure were explained to the patient and informed consent was obtained. Procedure: The patient reported no pain pre or post test. Initial setup. The patient was brought to the laboratory. Surface ECG leads were monitored. Sedation. Conscious sedation was administered by cardiology staff. Transesophageal echocardiography. Topical anesthesia was obtained using viscous lidocaine. A transesophageal probe was inserted by the attending cardiologist. Image quality was adequate. Study completion: The patient tolerated the procedure well. There were no complications. Administered medications: Fentanyl, 72mcg. Midazolam, 2mg . Diagnostic transesophageal echocardiography. 2D and color Doppler. Birthdate: Patient birthdate: [birth year]. Age: Patient is 70 yr old. Sex:  Gender: female. BMI: 24.3 kg/m^2. Blood pressure: 127/69 Patient status: Inpatient. Study date: Study date: 06/18/2016. Study time: 12:03 PM. Location: Endoscopy.  -------------------------------------------------------------------  ------------------------------------------------------------------- Left ventricle: The cavity size was mildly dilated. Systolic function was vigorous. The estimated ejection fraction was in the range of 65% to 70%.  ------------------------------------------------------------------- Aortic valve: Structurally normal valve. Cusp separation was normal. No evidence of vegetation. Doppler: There was no regurgitation.  ------------------------------------------------------------------- Aorta: There was atheromatous plaque involving the descending aorta. Aortic root: The aortic  root was normal in size. Ascending aorta: The ascending aorta was normal in size.  ------------------------------------------------------------------- Mitral valve: Mildly thickened leaflets . Flail motion involving the middle scallop of the posterior leaflet. Doppler: There was severe regurgitation directed eccentrically. Severe regurgitation is suggested by pulmonary vein systolic flow reversal and a vena contracta width >= 6 mm.  ------------------------------------------------------------------- Left atrium: The atrium was dilated. No evidence of thrombus in the atrial cavity or appendage.  ------------------------------------------------------------------- Pulmonic valve: Structurally normal valve. Cusp separation was normal. No evidence of vegetation.  ------------------------------------------------------------------- Tricuspid valve: Structurally normal valve. Leaflet separation was normal. No evidence of vegetation. Doppler: There was  no regurgitation.  ------------------------------------------------------------------- Pulmonary artery: The main pulmonary artery was normal-sized.  ------------------------------------------------------------------- Right atrium: The atrium was normal in size. No evidence of thrombus in the atrial cavity or appendage.  ------------------------------------------------------------------- Pericardium: There was no pericardial effusion.  ------------------------------------------------------------------- Prepared and Electronically Authenticated by  Nelva Bush, MD 2017-11-15T13:26:52     Right/Left Heart Cath and Coronary Angiography  Conclusion     The left ventricular systolic function is normal.  LV end diastolic pressure is normal.  There is severe (4+) mitral regurgitation.  1. Normal coronary arteries and a left dominant system. 2. Normal LV systolic function with an ejection fraction of 55%. 3. Severe mitral regurgitation with severely dilated left atrium. 4. Right heart catheterization showed mildly elevated left ventricular end-diastolic pressure (14 mmHg), giant V waves noted on pulmonary capillary wedge pressure with mild pulmonary hypertension. Pulmonary pressure was 39/10 with a mean of 27 mmHg. Mildly reduced cardiac output at 3.1 with a cardiac index of 2.09.  Recommendations: I recommend mitral valve repair. The patient volume status appears to be acceptable. Continue same dose of furosemide.   Indications   Non-rheumatic mitral regurgitation [I34.0 (ICD-10-CM)]  Procedural Details/Technique   Technical Details Procedural Details: There was a pre-existing IV in the right antecubital vein. I could not advance the wire through this. I used ultrasound guidance to identifying antecubital vein but was not successful. Thus, I placed a 7 French sheath in the right femoral vein. The right wrist was prepped, draped, and anesthetized with 1%  lidocaine. Using the modified Seldinger technique, a 5 French sheath was introduced into the right radial artery. 2.5 mg of verapamil was administered through the sheath, weight-based unfractionated heparin was administered intravenously. Right heart catheterization was performed using a 5 French Swan-Ganz catheter. Cardiac output was calculated by the Fick method. A Jackie catheter was used for selective coronary angiography. A pigtail catheter was used for left ventriculography. Catheter exchanges were performed over an exchange length guidewire. There were no immediate procedural complications. A TR band was used for radial hemostasis at the completion of the procedure. The patient was transferred to the post catheterization recovery area for further monitoring.   Estimated blood loss <50 mL.  During this procedure the patient was administered the following to achieve and maintain moderate conscious sedation: Versed 2 mg, Fentanyl 50 mcg, while the patient's heart rate, blood pressure, and oxygen saturation were continuously monitored. The period of conscious sedation was 56 minutes, of which I was present face-to-face 100% of this time.    Coronary Findings   Dominance: Left  Left Main  Vessel is angiographically normal.  Left Anterior Descending  Vessel is angiographically normal.  First Diagonal Branch  Vessel is angiographically normal.  Second Diagonal Branch  Vessel is angiographically normal.  Third Diagonal Branch  Vessel is small in size. Vessel is angiographically normal.  Left Circumflex  Vessel is angiographically normal.  First Obtuse Marginal Branch  Vessel is small in size. Vessel is angiographically normal.  Second Obtuse Marginal Branch  Vessel is angiographically normal.  Third Obtuse Marginal Branch  Vessel is angiographically normal.  Fourth Obtuse Marginal Branch  Vessel is angiographically normal.  Left Posterior Descending Artery  Vessel is angiographically  normal.  Right Coronary Artery  Vessel is angiographically normal.  Wall Motion              Left Heart   Left Ventricle The left ventricular size is normal. The left ventricular systolic function is normal. LV end diastolic pressure is normal. No regional wall motion abnormalities. There is severe (4+) mitral regurgitation.    Coronary Diagrams   Diagnostic Diagram       CTA ABDOMEN AND PELVIS WITH CONTRAST  TECHNIQUE: Multidetector CT imaging of the abdomen and pelvis was performed using the standard protocol during bolus administration of intravenous contrast. Multiplanar reconstructed images and MIPs were obtained and reviewed to evaluate the vascular anatomy.  CONTRAST:  100 mL Isovue 370 IV  COMPARISON:  CT 07/15/2013  FINDINGS: VASCULAR  Aorta: Mild atheromatous irregularity with calcified plaque predominately in the infrarenal segment. Some eccentric nonocclusive mural thrombus in the infrarenal segment. No aneurysm, dissection, or stenosis.  Celiac: Mild short-segment narrowing at the level of the median arcuate ligament of the diaphragm, patent distally.  SMA: Patent without evidence of aneurysm, dissection, vasculitis or significant stenosis.  Renals: Duplicated right renal arteries, superior dominant. Bilateral renal arteries are patent without evidence of aneurysm, dissection, vasculitis, fibromuscular dysplasia or significant stenosis.  IMA: Patent without evidence of aneurysm, dissection, vasculitis or significant stenosis.  Inflow: Nonocclusive calcified plaque throughout the common iliac arteries. Left common iliac ectatic up to 13 mm diameter, right 11 mm. No dissection or stenosis. External iliac arteries unremarkable.  Proximal Outflow: Bilateral common femoral and visualized portions of the superficial and profunda femoral arteries are patent without evidence of aneurysm, dissection, vasculitis or  significant stenosis.  Veins: No obvious venous abnormality within the limitations of this arterial phase study.  Review of the MIP images confirms the above findings.  NON-VASCULAR  Lower chest: Trace bilateral pleural effusions right greater than left. Dependent subsegmental atelectasis posteriorly in the visualized lower lobes.  Hepatobiliary: 18 mm left hepatic cyst as before. No new hepatic lesion. Interval cholecystectomy.  Pancreas: Unremarkable. No pancreatic ductal dilatation or surrounding inflammatory changes.  Spleen: Normal in size without focal abnormality.  Adrenals/Urinary Tract: Adrenal glands are unremarkable. Kidneys are normal, without renal calculi, focal lesion, or hydronephrosis. Bladder is unremarkable.  Stomach/Bowel: Stomach is within normal limits. Appendix appears normal. No evidence of bowel wall thickening, distention, or inflammatory changes.  Lymphatic: No adenopathy localized.  Reproductive: Uterus and bilateral adnexa are unremarkable.  Other: No ascites.  No free air.  Musculoskeletal: No acute or significant osseous findings.  IMPRESSION: VASCULAR  1. No acute findings. 2. Iliac and Aortic Atherosclerosis (ICD10-170.0) without significant stenosis or occlusion. 3. 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  NON-VASCULAR  1. Trace pleural effusions right greater than left. 2. Benign left hepatic cyst.   Electronically Signed   By: Lucrezia Europe M.D.   On: 06/20/2016 14:23   CHEST  2 VIEW  COMPARISON:  June 19, 2016  FINDINGS: The heart size and mediastinal contours are within normal limits. Both lungs are clear. The visualized skeletal structures are unremarkable.  IMPRESSION: No active cardiopulmonary disease.  Electronically Signed   By: Dorise Bullion III M.D   On: 06/30/2016 14:47    Impression:  Patient has stage D severe  symptomatic primary mitral regurgitation. She describes a several year history of progressive symptoms of exertional shortness of breath and fatigue consistent with chronic diastolic congestive heart failure. She was recently hospitalized at [facility] with acute exacerbation of respiratory failure in the setting of community-acquired pneumonia. She has recovered from this recent acute exacerbation and now reports that she is essentially back to her baseline.  I have personally reviewed the patient's multiple transthoracic echocardiograms, transesophageal echocardiogram, and diagnostic cardiac catheterization. 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 Left ventricular systolic function remains preserved. Catheterization is notable for the absence of significant coronary artery disease and pulmonary artery pressures were only mildly elevated. I agree the patient needs mitral valve repair. The patient has no documented history of atrial fibrillation although she did have reported history of recurrent bouts of SVT during her recent hospitalization at [facility] where she was treated for community-acquired pneumonia.   Plan:  The patient and her husband were counseled at length regarding the indications, risks and potential benefits of mitral valve repair.  The rationale for elective surgery has been explained, including a comparison between surgery and continued medical therapy with close follow-up.  The likelihood of successful and durable valve repair has been discussed with particular reference to the findings of their recent echocardiogram.  Based upon these findings and previous experience, I have quoted them a greater than 95 percent likelihood of successful valve repair.  In the unlikely event that their valve cannot be successfully repaired, we discussed the possibility of replacing the mitral valve using  a mechanical prosthesis with the attendant need for long-term anticoagulation versus the alternative of replacing it using a bioprosthetic tissue valve with its potential for late structural valve deterioration and failure, depending upon the patient's longevity.  The patient specifically requests that if the mitral valve must be replaced that it be done using a mechanical valve.   The patient understands and accepts all potential risks of surgery including but not limited to risk of death, stroke or other neurologic complication, myocardial infarction, congestive heart failure, respiratory failure, renal failure, bleeding requiring transfusion and/or reexploration, arrhythmia, infection or other wound complications, pneumonia, pleural and/or pericardial effusion, pulmonary embolus, aortic dissection or other major vascular complication, or delayed complications related to valve repair or replacement including but not limited to structural valve deterioration and failure, thrombosis, embolization, endocarditis, or paravalvular leak.  Alternative surgical approaches have been discussed including a comparison between conventional sternotomy and minimally-invasive techniques.  The relative risks and benefits of each have been reviewed as they pertain to the patient's specific circumstances, and all of their questions have been addressed.  Specific risks potentially related to the minimally-invasive approach were discussed at length, including but not limited to risk of conversion to full or partial sternotomy, aortic dissection or other major vascular complication, unilateral acute lung injury or pulmonary edema, phrenic nerve dysfunction or paralysis, rib fracture, chronic pain, lung hernia, or lymphocele.   All of their questions have been answered.  We tentatively plan to proceed with surgery on Wednesday, 07/02/2016. The patient will undergo routine follow-up chest x-ray and blood work to make sure that her  pneumonia has completely resolved. The patient has been instructed to stop taking aspirin. She will remain on all other  medications. On the morning of surgery the patient will take only Cardizem CD with a sip of water.     Valentina Gu. Roxy Manns, MD 06/30/2016 12:07 PM

## 2016-06-30 NOTE — Pre-Procedure Instructions (Addendum)
RHUNETTE MOESSNER  06/30/2016      [facility] [place], [place] - [address] [place] [address] Phone: [phone] Fax: [phone]    Your procedure is scheduled on Wednesday, November 29th, 2017.  Report to [facility] Admitting at 6:30 A.M.   Call this number if you have problems the morning of surgery:  [phone]   Remember:  Do not eat food or drink liquids after midnight.   Take these medicines the morning of surgery with A SIP OF WATER: Acetaminophen (Tylenol) if needed, Albuterol inhaler if needed (please bring with you), Amiodarone (Pacerone), Diltiazem (Cardizem), Spiriva.  Stop taking: Aspirin, NSAIDS, Aleve, Naproxen, Ibuprofen, Advil, Motrin, BC's, Goody's, Fish oil, all herbal medications, and all vitamins.    Do not wear jewelry, make-up or nail polish.  Do not wear lotions, powders, or perfumes, or deoderant.  Do not shave 48 hours prior to surgery.    Do not bring valuables to the hospital.  [facility] is not responsible for any belongings or valuables.  Contacts, dentures or bridgework may not be worn into surgery.  Leave your suitcase in the car.  After surgery it may be brought to your room.  For patients admitted to the hospital, discharge time will be determined by your treatment team.  Patients discharged the day of surgery will not be allowed to drive home.   Special instructions:  Preparing for Surgery.   [place]- Preparing For Surgery  Before surgery, you can play an important role. Because skin is not sterile, your skin needs to be as free of germs as possible. You can reduce the number of germs on your skin by washing with CHG (chlorahexidine gluconate) Soap before surgery.  CHG is an antiseptic cleaner which kills germs and bonds with the skin to continue killing germs even after washing.  Please do not use if you have an allergy to CHG or antibacterial soaps. If your skin becomes  reddened/irritated stop using the CHG.  Do not shave (including legs and underarms) for at least 48 hours prior to first CHG shower. It is OK to shave your face.  Please follow these instructions carefully.   1. Shower the NIGHT BEFORE SURGERY and the MORNING OF SURGERY with CHG.   2. If you chose to wash your hair, wash your hair first as usual with your normal shampoo.  3. After you shampoo, rinse your hair and body thoroughly to remove the shampoo.  4. Use CHG as you would any other liquid soap. You can apply CHG directly to the skin and wash gently with a scrungie or a clean washcloth.   5. Apply the CHG Soap to your body ONLY FROM THE NECK DOWN.  Do not use on open wounds or open sores. Avoid contact with your eyes, ears, mouth and genitals (private parts). Wash genitals (private parts) with your normal soap.  6. Wash thoroughly, paying special attention to the area where your surgery will be performed.  7. Thoroughly rinse your body with warm water from the neck down.  8. DO NOT shower/wash with your normal soap after using and rinsing off the CHG Soap.  9. Pat yourself dry with a CLEAN TOWEL.   10. Wear CLEAN PAJAMAS   11. Place CLEAN SHEETS on your bed the night of your first shower and DO NOT SLEEP WITH PETS.  Day of Surgery: Do not apply any deodorants/lotions. Please wear clean clothes to the hospital/surgery  center.     Please read over the following fact sheets that you were given. MRSA Information, Incentive Spirometry

## 2016-06-30 NOTE — Progress Notes (Signed)
[address]       [place] [address]             [phone]     CARDIOTHORACIC SURGERY OFFICE NOTE  Referring Provider is Wellington Hampshire, MD PCP is Tracie Harrier, MD   HPI:  Patient is a 70 year old female with history of mitral valve prolapse causing mitral regurgitation, palpitations, PVCs, long-standing symptoms of exertional shortness of breath, and tobacco abuse with COPD. She was originally seen in consultation on 06/20/2016 during her recent hospitalization. At that time she was recovering from acute pneumonia that was complicated by acute exacerbation of chronic diastolic congestive heart failure.  She now returns to our office for follow-up with tentative plans to proceed with elective mitral valve repair later this week.  She states that she has done well since hospital discharge. Specifically, she has not had problems with shortness of breath. She states that her breathing is essentially back to her baseline and how it has been 4 the last couple of years. She has not had fevers, chills, or productive cough. She has not had palpitations or dizzy spells. She has not been active physically. Appetite has been marginal.   Current Outpatient Prescriptions  Medication Sig Dispense Refill  . acetaminophen (TYLENOL) 325 MG tablet Take 2 tablets (650 mg total) by mouth every 6 (six) hours as needed for mild pain (or Fever >/= 101).    Marland Kitchen amiodarone (PACERONE) 200 MG tablet Take 1 tablet (200 mg total) by mouth 2 (two) times daily. 60 tablet 1  . aspirin 81 MG tablet Take 81 mg by mouth daily. AM    . Calcium Carbonate-Vitamin D (CALCIUM-VITAMIN D) 500-200 MG-UNIT per tablet Take 1 tablet by mouth daily. 10 AM    . Cholecalciferol (VITAMIN D3) 2000 UNITS TABS Take 1 tablet by mouth daily. 10 AM    . diltiazem (CARDIZEM CD) 120 MG 24 hr capsule Take 1 capsule (120 mg total) by mouth daily. 30 capsule 1  . furosemide (LASIX) 40 MG tablet Take 1 tablet (40 mg  total) by mouth daily. 30 tablet 1  . potassium chloride SA (K-DUR,KLOR-CON) 20 MEQ tablet Take 1 tablet (20 mEq total) by mouth daily. 30 tablet 1   No current facility-administered medications for this visit.       Physical Exam:   BP 102/66   Pulse 77   Resp 20   Ht 4\' 11"  (1.499 m)   Wt 116 lb (52.6 kg)   SpO2 98% Comment: RA  BMI 23.43 kg/m   General:  Well-appearing  Chest:   Clear to auscultation with symmetrical breath sounds  CV:   Regular rate and rhythm with holosystolic murmur  Incisions:  n/a  Abdomen:  Soft nontender  Extremities:  Warm and well-perfused, no edema  Diagnostic Tests:  n/a   Impression:  Patient has stage D severe symptomatic primary mitral regurgitation. She describes a several year history of progressive symptoms of exertional shortness of breath and fatigue consistent with chronic diastolic congestive heart failure. She was recently hospitalized at [facility] with acute exacerbation of respiratory failure in the setting of community-acquired pneumonia. She has recovered from this recent acute exacerbation and now reports that she is essentially back to her baseline.  I have personally reviewed the patient's multiple transthoracic echocardiograms, transesophageal echocardiogram, and diagnostic cardiac catheterization. 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  Left ventricular systolic function remains preserved. Catheterization is notable for the absence of significant coronary artery disease and pulmonary artery pressures were only mildly elevated.  I agree the patient needs mitral valve repair. The patient has no documented history of atrial fibrillation although she did have reported history of recurrent bouts of SVT during her recent hospitalization at [facility] where she was treated for community-acquired pneumonia.   Plan:  The patient and her  husband were counseled at length regarding the indications, risks and potential benefits of mitral valve repair.  The rationale for elective surgery has been explained, including a comparison between surgery and continued medical therapy with close follow-up.  The likelihood of successful and durable valve repair has been discussed with particular reference to the findings of their recent echocardiogram.  Based upon these findings and previous experience, I have quoted them a greater than 95 percent likelihood of successful valve repair.  In the unlikely event that their valve cannot be successfully repaired, we discussed the possibility of replacing the mitral valve using a mechanical prosthesis with the attendant need for long-term anticoagulation versus the alternative of replacing it using a bioprosthetic tissue valve with its potential for late structural valve deterioration and failure, depending upon the patient's longevity.  The patient specifically requests that if the mitral valve must be replaced that it be done using a mechanical valve.   The patient understands and accepts all potential risks of surgery including but not limited to risk of death, stroke or other neurologic complication, myocardial infarction, congestive heart failure, respiratory failure, renal failure, bleeding requiring transfusion and/or reexploration, arrhythmia, infection or other wound complications, pneumonia, pleural and/or pericardial effusion, pulmonary embolus, aortic dissection or other major vascular complication, or delayed complications related to valve repair or replacement including but not limited to structural valve deterioration and failure, thrombosis, embolization, endocarditis, or paravalvular leak.  Alternative surgical approaches have been discussed including a comparison between conventional sternotomy and minimally-invasive techniques.  The relative risks and benefits of each have been reviewed as they pertain  to the patient's specific circumstances, and all of their questions have been addressed.  Specific risks potentially related to the minimally-invasive approach were discussed at length, including but not limited to risk of conversion to full or partial sternotomy, aortic dissection or other major vascular complication, unilateral acute lung injury or pulmonary edema, phrenic nerve dysfunction or paralysis, rib fracture, chronic pain, lung hernia, or lymphocele.   All of their questions have been answered.  We tentatively plan to proceed with surgery on Wednesday, 07/02/2016. The patient will undergo routine follow-up chest x-ray and blood work to make sure that her pneumonia has completely resolved. The patient has been instructed to stop taking aspirin. She will remain on all other medications. On the morning of surgery the patient will take only Cardizem CD with a sip of water.  I spent in excess of 30 minutes during the conduct of this office consultation and >50% of this time involved direct face-to-face encounter with the patient for counseling and/or coordination of their care.   Valentina Gu. Roxy Manns, MD 06/30/2016 12:07 PM

## 2016-06-30 NOTE — Patient Instructions (Signed)
Stop taking aspirin  Continue taking all other medications without change through the day before surgery.  Have nothing to eat or drink after midnight the night before surgery.  On the morning of surgery take only Cardizem with a sip of water.

## 2016-07-01 ENCOUNTER — Inpatient Hospital Stay: Payer: PPO | Admitting: Pulmonary Disease

## 2016-07-01 LAB — HEMOGLOBIN A1C
HEMOGLOBIN A1C: 5.5 % (ref 4.8–5.6)
Mean Plasma Glucose: 111 mg/dL

## 2016-07-01 MED ORDER — DEXTROSE 5 % IV SOLN
750.0000 mg | INTRAVENOUS | Status: DC
  Filled 2016-07-01: qty 750

## 2016-07-01 MED ORDER — SODIUM CHLORIDE 0.9 % IV SOLN
INTRAVENOUS | Status: AC
  Administered 2016-07-02: 1.2 [IU]/h via INTRAVENOUS
  Administered 2016-07-02: 1 [IU]/h via INTRAVENOUS
  Filled 2016-07-01: qty 2.5

## 2016-07-01 MED ORDER — PHENYLEPHRINE HCL 10 MG/ML IJ SOLN
30.0000 ug/min | INTRAVENOUS | Status: DC
  Filled 2016-07-01: qty 2

## 2016-07-01 MED ORDER — SODIUM CHLORIDE 0.9 % IV SOLN
INTRAVENOUS | Status: DC
  Filled 2016-07-01: qty 30

## 2016-07-01 MED ORDER — GLUTARALDEHYDE 0.625% SOAKING SOLUTION
TOPICAL | Status: DC | PRN
  Filled 2016-07-01: qty 50

## 2016-07-01 MED ORDER — POTASSIUM CHLORIDE 2 MEQ/ML IV SOLN
80.0000 meq | INTRAVENOUS | Status: DC
  Filled 2016-07-01: qty 40

## 2016-07-01 MED ORDER — DOPAMINE-DEXTROSE 3.2-5 MG/ML-% IV SOLN
0.0000 ug/kg/min | INTRAVENOUS | Status: DC
  Filled 2016-07-01: qty 250

## 2016-07-01 MED ORDER — EPINEPHRINE PF 1 MG/ML IJ SOLN
0.0000 ug/min | INTRAVENOUS | Status: DC
  Filled 2016-07-01: qty 4

## 2016-07-01 MED ORDER — TRANEXAMIC ACID (OHS) BOLUS VIA INFUSION
15.0000 mg/kg | INTRAVENOUS | Status: AC
  Administered 2016-07-02: 823.5 mg via INTRAVENOUS
  Filled 2016-07-01: qty 824

## 2016-07-01 MED ORDER — MAGNESIUM SULFATE 50 % IJ SOLN
40.0000 meq | INTRAMUSCULAR | Status: DC
  Filled 2016-07-01: qty 10

## 2016-07-01 MED ORDER — VANCOMYCIN HCL 10 G IV SOLR
1250.0000 mg | INTRAVENOUS | Status: AC
  Administered 2016-07-02: 1250 mg via INTRAVENOUS
  Filled 2016-07-01 (×2): qty 1250

## 2016-07-01 MED ORDER — VANCOMYCIN HCL 1000 MG IV SOLR
INTRAVENOUS | Status: AC
  Administered 2016-07-02: 1000 mL
  Filled 2016-07-01: qty 1000

## 2016-07-01 MED ORDER — TRANEXAMIC ACID (OHS) PUMP PRIME SOLUTION
2.0000 mg/kg | INTRAVENOUS | Status: DC
  Filled 2016-07-01: qty 1.1

## 2016-07-01 MED ORDER — DEXMEDETOMIDINE HCL IN NACL 400 MCG/100ML IV SOLN
0.1000 ug/kg/h | INTRAVENOUS | Status: AC
  Administered 2016-07-02: .3 ug/kg/h via INTRAVENOUS
  Filled 2016-07-01: qty 100

## 2016-07-01 MED ORDER — DEXTROSE 5 % IV SOLN
1.5000 g | INTRAVENOUS | Status: AC
  Administered 2016-07-02: .75 g via INTRAVENOUS
  Administered 2016-07-02: 1.5 g via INTRAVENOUS
  Filled 2016-07-01: qty 1.5

## 2016-07-01 MED ORDER — PAPAVERINE HCL 30 MG/ML IJ SOLN
INTRAMUSCULAR | Status: DC
  Filled 2016-07-01: qty 2.5

## 2016-07-01 MED ORDER — NITROGLYCERIN IN D5W 200-5 MCG/ML-% IV SOLN
2.0000 ug/min | INTRAVENOUS | Status: DC
  Filled 2016-07-01: qty 250

## 2016-07-01 MED ORDER — TRANEXAMIC ACID 1000 MG/10ML IV SOLN
1.5000 mg/kg/h | INTRAVENOUS | Status: AC
  Administered 2016-07-02: 1.5 mg/kg/h via INTRAVENOUS
  Filled 2016-07-01: qty 25

## 2016-07-01 NOTE — Anesthesia Preprocedure Evaluation (Addendum)
Anesthesia Evaluation  Patient identified by MRN, date of birth, ID band Patient awake    Reviewed: Allergy & Precautions, NPO status , Patient's Chart, lab work & pertinent test results  History of Anesthesia Complications Negative for: history of anesthetic complications  Airway Mallampati: II  TM Distance: >3 FB Neck ROM: Full    Dental  (+) Dental Advisory Given   Pulmonary pneumonia, resolved, COPD, Current Smoker,    breath sounds clear to auscultation       Cardiovascular hypertension, Pt. on medications + Peripheral Vascular Disease  + dysrhythmias Atrial Fibrillation + Valvular Problems/Murmurs (severe MR) MR  Rhythm:Regular Rate:Normal  06/18/16 TEE:  EF 65-70%, severe MR with eccentric jet, flail segment of the posterior leaflet 11/17 cath: normal coronaries   Neuro/Psych  Headaches,    GI/Hepatic negative GI ROS, Neg liver ROS,   Endo/Other  negative endocrine ROS  Renal/GU negative Renal ROS     Musculoskeletal   Abdominal   Peds  Hematology negative hematology ROS (+) Hb 11.9   Anesthesia Other Findings   Reproductive/Obstetrics                            Anesthesia Physical Anesthesia Plan  ASA: III  Anesthesia Plan: General   Post-op Pain Management:    Induction: Intravenous  Airway Management Planned: Oral ETT and Double Lumen EBT  Additional Equipment: Arterial line, PA Cath, 3D TEE and Ultrasound Guidance Line Placement  Intra-op Plan:   Post-operative Plan: Post-operative intubation/ventilation  Informed Consent: I have reviewed the patients History and Physical, chart, labs and discussed the procedure including the risks, benefits and alternatives for the proposed anesthesia with the patient or authorized representative who has indicated his/her understanding and acceptance.   Dental advisory given  Plan Discussed with: CRNA and Surgeon  Anesthesia  Plan Comments: (Plan routine monitors, A line, PA cath, GETA with TEE and post op ventilation)        Anesthesia Quick Evaluation

## 2016-07-02 ENCOUNTER — Inpatient Hospital Stay (HOSPITAL_COMMUNITY): Payer: PPO

## 2016-07-02 ENCOUNTER — Inpatient Hospital Stay (HOSPITAL_COMMUNITY): Payer: PPO | Admitting: Vascular Surgery

## 2016-07-02 ENCOUNTER — Inpatient Hospital Stay (HOSPITAL_COMMUNITY)
Admission: RE | Admit: 2016-07-02 | Discharge: 2016-07-09 | DRG: 219 | Disposition: A | Discharge status: Home / Self Care | Payer: PPO | Source: Ambulatory Visit | Attending: Thoracic Surgery (Cardiothoracic Vascular Surgery) | Admitting: Thoracic Surgery (Cardiothoracic Vascular Surgery)

## 2016-07-02 ENCOUNTER — Encounter (HOSPITAL_COMMUNITY): Payer: Self-pay | Admitting: *Deleted

## 2016-07-02 ENCOUNTER — Encounter (HOSPITAL_COMMUNITY)
Admission: RE | Disposition: A | Discharge status: Home / Self Care | Payer: Self-pay | Source: Ambulatory Visit | Attending: Thoracic Surgery (Cardiothoracic Vascular Surgery)

## 2016-07-02 DIAGNOSIS — J449 Chronic obstructive pulmonary disease, unspecified: Secondary | ICD-10-CM | POA: Diagnosis present

## 2016-07-02 DIAGNOSIS — I058 Other rheumatic mitral valve diseases: Secondary | ICD-10-CM | POA: Diagnosis not present

## 2016-07-02 DIAGNOSIS — R0600 Dyspnea, unspecified: Secondary | ICD-10-CM | POA: Diagnosis present

## 2016-07-02 DIAGNOSIS — E8770 Fluid overload, unspecified: Secondary | ICD-10-CM | POA: Diagnosis not present

## 2016-07-02 DIAGNOSIS — I511 Rupture of chordae tendineae, not elsewhere classified: Secondary | ICD-10-CM | POA: Diagnosis present

## 2016-07-02 DIAGNOSIS — I110 Hypertensive heart disease with heart failure: Secondary | ICD-10-CM | POA: Diagnosis present

## 2016-07-02 DIAGNOSIS — I471 Supraventricular tachycardia: Secondary | ICD-10-CM | POA: Diagnosis not present

## 2016-07-02 DIAGNOSIS — I5032 Chronic diastolic (congestive) heart failure: Secondary | ICD-10-CM | POA: Diagnosis present

## 2016-07-02 DIAGNOSIS — I340 Nonrheumatic mitral (valve) insufficiency: Secondary | ICD-10-CM | POA: Diagnosis not present

## 2016-07-02 DIAGNOSIS — I272 Pulmonary hypertension, unspecified: Secondary | ICD-10-CM | POA: Diagnosis not present

## 2016-07-02 DIAGNOSIS — I493 Ventricular premature depolarization: Secondary | ICD-10-CM | POA: Diagnosis present

## 2016-07-02 DIAGNOSIS — I442 Atrioventricular block, complete: Secondary | ICD-10-CM | POA: Diagnosis not present

## 2016-07-02 DIAGNOSIS — Z820 Family history of epilepsy and other diseases of the nervous system: Secondary | ICD-10-CM | POA: Diagnosis not present

## 2016-07-02 DIAGNOSIS — R0602 Shortness of breath: Secondary | ICD-10-CM | POA: Diagnosis not present

## 2016-07-02 DIAGNOSIS — Z4682 Encounter for fitting and adjustment of non-vascular catheter: Secondary | ICD-10-CM | POA: Diagnosis not present

## 2016-07-02 DIAGNOSIS — D6959 Other secondary thrombocytopenia: Secondary | ICD-10-CM | POA: Diagnosis not present

## 2016-07-02 DIAGNOSIS — I440 Atrioventricular block, first degree: Secondary | ICD-10-CM | POA: Diagnosis not present

## 2016-07-02 DIAGNOSIS — J939 Pneumothorax, unspecified: Secondary | ICD-10-CM

## 2016-07-02 DIAGNOSIS — R791 Abnormal coagulation profile: Secondary | ICD-10-CM | POA: Diagnosis not present

## 2016-07-02 DIAGNOSIS — Z9889 Other specified postprocedural states: Secondary | ICD-10-CM

## 2016-07-02 DIAGNOSIS — R001 Bradycardia, unspecified: Secondary | ICD-10-CM | POA: Diagnosis not present

## 2016-07-02 DIAGNOSIS — I4891 Unspecified atrial fibrillation: Secondary | ICD-10-CM | POA: Diagnosis not present

## 2016-07-02 DIAGNOSIS — Z7982 Long term (current) use of aspirin: Secondary | ICD-10-CM | POA: Diagnosis not present

## 2016-07-02 DIAGNOSIS — F17210 Nicotine dependence, cigarettes, uncomplicated: Secondary | ICD-10-CM | POA: Diagnosis not present

## 2016-07-02 DIAGNOSIS — D62 Acute posthemorrhagic anemia: Secondary | ICD-10-CM | POA: Diagnosis not present

## 2016-07-02 DIAGNOSIS — J9811 Atelectasis: Secondary | ICD-10-CM

## 2016-07-02 DIAGNOSIS — J440 Chronic obstructive pulmonary disease with acute lower respiratory infection: Secondary | ICD-10-CM | POA: Diagnosis not present

## 2016-07-02 DIAGNOSIS — R911 Solitary pulmonary nodule: Secondary | ICD-10-CM | POA: Diagnosis present

## 2016-07-02 DIAGNOSIS — Z823 Family history of stroke: Secondary | ICD-10-CM | POA: Diagnosis not present

## 2016-07-02 DIAGNOSIS — R05 Cough: Secondary | ICD-10-CM | POA: Diagnosis not present

## 2016-07-02 DIAGNOSIS — Z803 Family history of malignant neoplasm of breast: Secondary | ICD-10-CM

## 2016-07-02 DIAGNOSIS — R0609 Other forms of dyspnea: Secondary | ICD-10-CM

## 2016-07-02 DIAGNOSIS — I2510 Atherosclerotic heart disease of native coronary artery without angina pectoris: Secondary | ICD-10-CM | POA: Diagnosis not present

## 2016-07-02 DIAGNOSIS — I509 Heart failure, unspecified: Secondary | ICD-10-CM | POA: Diagnosis not present

## 2016-07-02 DIAGNOSIS — Z7901 Long term (current) use of anticoagulants: Secondary | ICD-10-CM | POA: Diagnosis not present

## 2016-07-02 DIAGNOSIS — I081 Rheumatic disorders of both mitral and tricuspid valves: Secondary | ICD-10-CM | POA: Diagnosis not present

## 2016-07-02 HISTORY — PX: MITRAL VALVE REPAIR: SHX2039

## 2016-07-02 HISTORY — DX: Other specified postprocedural states: Z98.890

## 2016-07-02 HISTORY — PX: TEE WITHOUT CARDIOVERSION: SHX5443

## 2016-07-02 HISTORY — PX: CLIPPING OF ATRIAL APPENDAGE: SHX5773

## 2016-07-02 LAB — POCT I-STAT, CHEM 8
BUN: 8 mg/dL (ref 6–20)
BUN: 8 mg/dL (ref 6–20)
BUN: 7 mg/dL (ref 6–20)
BUN: 7 mg/dL (ref 6–20)
BUN: 8 mg/dL (ref 6–20)
BUN: 6 mg/dL (ref 6–20)
BUN: 7 mg/dL (ref 6–20)
CALCIUM ION: 1.06 mmol/L — AB (ref 1.15–1.40)
CHLORIDE: 103 mmol/L (ref 101–111)
CHLORIDE: 91 mmol/L — AB (ref 101–111)
CHLORIDE: 98 mmol/L — AB (ref 101–111)
CHLORIDE: 99 mmol/L — AB (ref 101–111)
CHLORIDE: 105 mmol/L (ref 101–111)
CHLORIDE: 102 mmol/L (ref 101–111)
CREATININE: 0.7 mg/dL (ref 0.44–1.00)
CREATININE: 0.6 mg/dL (ref 0.44–1.00)
CREATININE: 0.6 mg/dL (ref 0.44–1.00)
Calcium, Ion: 1.26 mmol/L (ref 1.15–1.40)
Calcium, Ion: 1.27 mmol/L (ref 1.15–1.40)
Calcium, Ion: 1 mmol/L — ABNORMAL LOW (ref 1.15–1.40)
Calcium, Ion: 1.02 mmol/L — ABNORMAL LOW (ref 1.15–1.40)
Calcium, Ion: 1.06 mmol/L — ABNORMAL LOW (ref 1.15–1.40)
Calcium, Ion: 1.03 mmol/L — ABNORMAL LOW (ref 1.15–1.40)
Chloride: 104 mmol/L (ref 101–111)
Creatinine, Ser: 0.6 mg/dL (ref 0.44–1.00)
Creatinine, Ser: 0.5 mg/dL (ref 0.44–1.00)
Creatinine, Ser: 0.6 mg/dL (ref 0.44–1.00)
Creatinine, Ser: 0.7 mg/dL (ref 0.44–1.00)
GLUCOSE: 143 mg/dL — AB (ref 65–99)
Glucose, Bld: 116 mg/dL — ABNORMAL HIGH (ref 65–99)
Glucose, Bld: 121 mg/dL — ABNORMAL HIGH (ref 65–99)
Glucose, Bld: 119 mg/dL — ABNORMAL HIGH (ref 65–99)
Glucose, Bld: 156 mg/dL — ABNORMAL HIGH (ref 65–99)
Glucose, Bld: 152 mg/dL — ABNORMAL HIGH (ref 65–99)
Glucose, Bld: 143 mg/dL — ABNORMAL HIGH (ref 65–99)
HCT: 23 % — ABNORMAL LOW (ref 36.0–46.0)
HCT: 20 % — ABNORMAL LOW (ref 36.0–46.0)
HCT: 24 % — ABNORMAL LOW (ref 36.0–46.0)
HEMATOCRIT: 26 % — AB (ref 36.0–46.0)
HEMATOCRIT: 30 % — AB (ref 36.0–46.0)
HEMATOCRIT: 18 % — AB (ref 36.0–46.0)
HEMATOCRIT: 22 % — AB (ref 36.0–46.0)
HEMOGLOBIN: 10.2 g/dL — AB (ref 12.0–15.0)
HEMOGLOBIN: 8.8 g/dL — AB (ref 12.0–15.0)
HEMOGLOBIN: 7.5 g/dL — AB (ref 12.0–15.0)
Hemoglobin: 6.1 g/dL — CL (ref 12.0–15.0)
Hemoglobin: 7.8 g/dL — ABNORMAL LOW (ref 12.0–15.0)
Hemoglobin: 6.8 g/dL — CL (ref 12.0–15.0)
Hemoglobin: 8.2 g/dL — ABNORMAL LOW (ref 12.0–15.0)
POTASSIUM: 3.6 mmol/L (ref 3.5–5.1)
POTASSIUM: 4 mmol/L (ref 3.5–5.1)
POTASSIUM: 3.9 mmol/L (ref 3.5–5.1)
POTASSIUM: 4.3 mmol/L (ref 3.5–5.1)
POTASSIUM: 5.2 mmol/L — AB (ref 3.5–5.1)
Potassium: 4.4 mmol/L (ref 3.5–5.1)
Potassium: 4.1 mmol/L (ref 3.5–5.1)
SODIUM: 140 mmol/L (ref 135–145)
SODIUM: 128 mmol/L — AB (ref 135–145)
SODIUM: 137 mmol/L (ref 135–145)
SODIUM: 138 mmol/L (ref 135–145)
Sodium: 139 mmol/L (ref 135–145)
Sodium: 134 mmol/L — ABNORMAL LOW (ref 135–145)
Sodium: 136 mmol/L (ref 135–145)
TCO2: 27 mmol/L (ref 0–100)
TCO2: 28 mmol/L (ref 0–100)
TCO2: 24 mmol/L (ref 0–100)
TCO2: 26 mmol/L (ref 0–100)
TCO2: 23 mmol/L (ref 0–100)
TCO2: 22 mmol/L (ref 0–100)
TCO2: 22 mmol/L (ref 0–100)

## 2016-07-02 LAB — POCT I-STAT 3, ART BLOOD GAS (G3+)
Acid-Base Excess: 1 mmol/L (ref 0.0–2.0)
Acid-base deficit: 4 mmol/L — ABNORMAL HIGH (ref 0.0–2.0)
Bicarbonate: 26.4 mmol/L (ref 20.0–28.0)
Bicarbonate: 21.9 mmol/L (ref 20.0–28.0)
O2 SAT: 100 %
O2 Saturation: 95 %
PCO2 ART: 46.4 mmHg (ref 32.0–48.0)
PCO2 ART: 36.8 mmHg (ref 32.0–48.0)
PH ART: 7.374 (ref 7.350–7.450)
TCO2: 28 mmol/L (ref 0–100)
TCO2: 23 mmol/L (ref 0–100)
pH, Arterial: 7.364 (ref 7.350–7.450)
pO2, Arterial: 290 mmHg — ABNORMAL HIGH (ref 83.0–108.0)
pO2, Arterial: 71 mmHg — ABNORMAL LOW (ref 83.0–108.0)

## 2016-07-02 LAB — CBC
HEMATOCRIT: 31.7 % — AB (ref 36.0–46.0)
HEMATOCRIT: 29.4 % — AB (ref 36.0–46.0)
HEMOGLOBIN: 10.6 g/dL — AB (ref 12.0–15.0)
HEMOGLOBIN: 9.7 g/dL — AB (ref 12.0–15.0)
MCH: 30.7 pg (ref 26.0–34.0)
MCH: 30.3 pg (ref 26.0–34.0)
MCHC: 33.4 g/dL (ref 30.0–36.0)
MCHC: 33 g/dL (ref 30.0–36.0)
MCV: 91.9 fL (ref 78.0–100.0)
MCV: 91.9 fL (ref 78.0–100.0)
Platelets: 213 10*3/uL (ref 150–400)
Platelets: 175 10*3/uL (ref 150–400)
RBC: 3.45 MIL/uL — AB (ref 3.87–5.11)
RBC: 3.2 MIL/uL — ABNORMAL LOW (ref 3.87–5.11)
RDW: 13.1 % (ref 11.5–15.5)
RDW: 13.3 % (ref 11.5–15.5)
WBC: 18.6 10*3/uL — AB (ref 4.0–10.5)
WBC: 15.1 10*3/uL — AB (ref 4.0–10.5)

## 2016-07-02 LAB — GLUCOSE, CAPILLARY
GLUCOSE-CAPILLARY: 80 mg/dL (ref 65–99)
GLUCOSE-CAPILLARY: 74 mg/dL (ref 65–99)
Glucose-Capillary: 112 mg/dL — ABNORMAL HIGH (ref 65–99)

## 2016-07-02 LAB — CREATININE, SERUM
CREATININE: 0.82 mg/dL (ref 0.44–1.00)
GFR calc Af Amer: >60 mL/min (ref >60)

## 2016-07-02 LAB — PROTIME-INR
INR: 1.3
Prothrombin Time: 16.3 seconds — ABNORMAL HIGH (ref 11.4–15.2)

## 2016-07-02 LAB — PREPARE RBC (CROSSMATCH)

## 2016-07-02 LAB — PLATELET COUNT: Platelets: 215 10*3/uL (ref 150–400)

## 2016-07-02 LAB — HEMOGLOBIN AND HEMATOCRIT, BLOOD
HCT: 19.5 % — ABNORMAL LOW (ref 36.0–46.0)
HEMOGLOBIN: 6.4 g/dL — AB (ref 12.0–15.0)

## 2016-07-02 LAB — APTT: APTT: 43 s — AB (ref 24–36)

## 2016-07-02 LAB — MAGNESIUM: MAGNESIUM: 3.1 mg/dL — AB (ref 1.7–2.4)

## 2016-07-02 SURGERY — REPAIR, MITRAL VALVE, MINIMALLY INVASIVE
Anesthesia: General | Site: Chest | Laterality: Right

## 2016-07-02 MED ORDER — VANCOMYCIN HCL IN DEXTROSE 1-5 GM/200ML-% IV SOLN
1000.0000 mg | Freq: Once | INTRAVENOUS | Status: AC
  Administered 2016-07-02: 1000 mg via INTRAVENOUS
  Filled 2016-07-02: qty 200

## 2016-07-02 MED ORDER — LACTATED RINGERS IV SOLN
INTRAVENOUS | Status: DC | PRN
  Administered 2016-07-02: 08:00:00 via INTRAVENOUS

## 2016-07-02 MED ORDER — METOPROLOL TARTRATE 12.5 MG HALF TABLET
12.5000 mg | ORAL_TABLET | Freq: Once | ORAL | Status: AC
  Administered 2016-07-02: 12.5 mg via ORAL
  Filled 2016-07-02: qty 1

## 2016-07-02 MED ORDER — TRAMADOL HCL 50 MG PO TABS: 50.0000 mg | ORAL_TABLET | ORAL | Status: DC | PRN

## 2016-07-02 MED ORDER — ROCURONIUM BROMIDE 10 MG/ML (PF) SYRINGE
PREFILLED_SYRINGE | INTRAVENOUS | Status: AC
  Filled 2016-07-02: qty 20

## 2016-07-02 MED ORDER — NITROGLYCERIN IN D5W 200-5 MCG/ML-% IV SOLN: 0.0000 ug/min | INTRAVENOUS | Status: DC

## 2016-07-02 MED ORDER — LACTATED RINGERS IV SOLN
INTRAVENOUS | Status: DC | PRN
  Administered 2016-07-02 (×2): via INTRAVENOUS

## 2016-07-02 MED ORDER — DEXTROSE 5 % IV SOLN
INTRAVENOUS | Status: DC | PRN
  Administered 2016-07-02: 15 ug/min via INTRAVENOUS

## 2016-07-02 MED ORDER — ACETAMINOPHEN 650 MG RE SUPP
650.0000 mg | Freq: Once | RECTAL | Status: AC
  Administered 2016-07-02: 650 mg via RECTAL

## 2016-07-02 MED ORDER — INSULIN REGULAR BOLUS VIA INFUSION
0.0000 [IU] | Freq: Three times a day (TID) | INTRAVENOUS | Status: DC
  Filled 2016-07-02: qty 10

## 2016-07-02 MED ORDER — PROPOFOL 10 MG/ML IV BOLUS
INTRAVENOUS | Status: DC | PRN
Start: 2016-07-02 — End: 2016-07-02
  Administered 2016-07-02: 30 mg via INTRAVENOUS

## 2016-07-02 MED ORDER — 0.9 % SODIUM CHLORIDE (POUR BTL) OPTIME
TOPICAL | Status: DC | PRN
  Administered 2016-07-02: 5000 mL

## 2016-07-02 MED ORDER — LACTATED RINGERS IV SOLN: 500.0000 mL | Freq: Once | INTRAVENOUS | Status: DC | PRN

## 2016-07-02 MED ORDER — ALBUTEROL SULFATE HFA 108 (90 BASE) MCG/ACT IN AERS
INHALATION_SPRAY | RESPIRATORY_TRACT | Status: AC
  Filled 2016-07-02: qty 6.7

## 2016-07-02 MED ORDER — LACTATED RINGERS IV SOLN: INTRAVENOUS | Status: DC

## 2016-07-02 MED ORDER — FAMOTIDINE IN NACL 20-0.9 MG/50ML-% IV SOLN
20.0000 mg | Freq: Two times a day (BID) | INTRAVENOUS | Status: AC
  Administered 2016-07-02 – 2016-07-03 (×2): 20 mg via INTRAVENOUS
  Filled 2016-07-02: qty 50

## 2016-07-02 MED ORDER — METOPROLOL TARTRATE 12.5 MG HALF TABLET
12.5000 mg | ORAL_TABLET | Freq: Two times a day (BID) | ORAL | Status: DC
  Administered 2016-07-03: 12.5 mg via ORAL
  Filled 2016-07-02: qty 1

## 2016-07-02 MED ORDER — OXYCODONE HCL 5 MG PO TABS
5.0000 mg | ORAL_TABLET | ORAL | Status: DC | PRN
  Administered 2016-07-03: 10 mg via ORAL
  Filled 2016-07-02: qty 2

## 2016-07-02 MED ORDER — SODIUM CHLORIDE 0.9 % IV SOLN
INTRAVENOUS | Status: DC
  Administered 2016-07-02: 16:00:00 via INTRAVENOUS

## 2016-07-02 MED ORDER — BUPIVACAINE 0.5 % ON-Q PUMP SINGLE CATH 400 ML
INJECTION | Status: DC | PRN
  Administered 2016-07-02: 400 mL

## 2016-07-02 MED ORDER — LACTATED RINGERS IV SOLN
INTRAVENOUS | Status: DC | PRN
  Administered 2016-07-02 (×2): via INTRAVENOUS

## 2016-07-02 MED ORDER — ARTIFICIAL TEARS OP OINT
TOPICAL_OINTMENT | OPHTHALMIC | Status: DC | PRN
  Administered 2016-07-02: 1 via OPHTHALMIC

## 2016-07-02 MED ORDER — ALBUMIN HUMAN 5 % IV SOLN
250.0000 mL | INTRAVENOUS | Status: DC | PRN
  Administered 2016-07-02 – 2016-07-03 (×2): 250 mL via INTRAVENOUS

## 2016-07-02 MED ORDER — DOCUSATE SODIUM 100 MG PO CAPS
200.0000 mg | ORAL_CAPSULE | Freq: Every day | ORAL | Status: DC
  Administered 2016-07-03 – 2016-07-06 (×3): 200 mg via ORAL
  Filled 2016-07-02 (×3): qty 2

## 2016-07-02 MED ORDER — ALBUTEROL SULFATE HFA 108 (90 BASE) MCG/ACT IN AERS
INHALATION_SPRAY | RESPIRATORY_TRACT | Status: DC | PRN
  Administered 2016-07-02: 3 via RESPIRATORY_TRACT

## 2016-07-02 MED ORDER — PHENYLEPHRINE HCL 10 MG/ML IJ SOLN
INTRAVENOUS | Status: DC | PRN
  Administered 2016-07-02: 10 ug/min via INTRAVENOUS

## 2016-07-02 MED ORDER — SODIUM CHLORIDE 0.9 % IV SOLN
30.0000 meq | Freq: Once | INTRAVENOUS | Status: AC
  Administered 2016-07-02: 30 meq via INTRAVENOUS
  Filled 2016-07-02: qty 15

## 2016-07-02 MED ORDER — CHLORHEXIDINE GLUCONATE 0.12 % MT SOLN
15.0000 mL | OROMUCOSAL | Status: AC
  Administered 2016-07-02: 15 mL via OROMUCOSAL

## 2016-07-02 MED ORDER — HEPARIN SODIUM (PORCINE) 1000 UNIT/ML IJ SOLN
INTRAMUSCULAR | Status: DC | PRN
  Administered 2016-07-02: 17000 [IU] via INTRAVENOUS

## 2016-07-02 MED ORDER — FENTANYL CITRATE (PF) 250 MCG/5ML IJ SOLN
INTRAMUSCULAR | Status: DC | PRN
  Administered 2016-07-02: 150 ug via INTRAVENOUS
  Administered 2016-07-02: 50 ug via INTRAVENOUS
  Administered 2016-07-02: 100 ug via INTRAVENOUS
  Administered 2016-07-02 (×2): 150 ug via INTRAVENOUS
  Administered 2016-07-02: 200 ug via INTRAVENOUS
  Administered 2016-07-02 (×2): 100 ug via INTRAVENOUS

## 2016-07-02 MED ORDER — DEXMEDETOMIDINE HCL IN NACL 200 MCG/50ML IV SOLN: 0.0000 ug/kg/h | INTRAVENOUS | Status: DC

## 2016-07-02 MED ORDER — CHLORHEXIDINE GLUCONATE 4 % EX LIQD: 30.0000 mL | CUTANEOUS | Status: DC

## 2016-07-02 MED ORDER — HEPARIN SODIUM (PORCINE) 1000 UNIT/ML IJ SOLN
INTRAMUSCULAR | Status: AC
  Filled 2016-07-02: qty 1

## 2016-07-02 MED ORDER — SODIUM CHLORIDE 0.45 % IV SOLN: INTRAVENOUS | Status: DC | PRN

## 2016-07-02 MED ORDER — MAGNESIUM SULFATE 4 GM/100ML IV SOLN
4.0000 g | Freq: Once | INTRAVENOUS | Status: AC
  Administered 2016-07-02: 4 g via INTRAVENOUS
  Filled 2016-07-02: qty 100

## 2016-07-02 MED ORDER — CHLORHEXIDINE GLUCONATE 0.12% ORAL RINSE (MEDLINE KIT)
15.0000 mL | Freq: Two times a day (BID) | OROMUCOSAL | Status: DC
  Administered 2016-07-02 – 2016-07-03 (×2): 15 mL via OROMUCOSAL

## 2016-07-02 MED ORDER — BISACODYL 10 MG RE SUPP: 10.0000 mg | Freq: Every day | RECTAL | Status: DC

## 2016-07-02 MED ORDER — PROPOFOL 10 MG/ML IV BOLUS
INTRAVENOUS | Status: AC
  Filled 2016-07-02: qty 20

## 2016-07-02 MED ORDER — SODIUM CHLORIDE 0.9 % IV SOLN: INTRAVENOUS | Status: DC

## 2016-07-02 MED ORDER — MORPHINE SULFATE (PF) 2 MG/ML IV SOLN: 1.0000 mg | INTRAVENOUS | Status: DC | PRN

## 2016-07-02 MED ORDER — ACETAMINOPHEN 160 MG/5ML PO SOLN: 650.0000 mg | Freq: Once | ORAL | Status: AC

## 2016-07-02 MED ORDER — ACETAMINOPHEN 500 MG PO TABS
1000.0000 mg | ORAL_TABLET | Freq: Four times a day (QID) | ORAL | Status: AC
  Administered 2016-07-03 – 2016-07-07 (×17): 1000 mg via ORAL
  Filled 2016-07-02 (×16): qty 2

## 2016-07-02 MED ORDER — INSULIN ASPART 100 UNIT/ML ~~LOC~~ SOLN
0.0000 [IU] | SUBCUTANEOUS | Status: DC
  Administered 2016-07-02: 4 [IU] via SUBCUTANEOUS
  Administered 2016-07-03: 2 [IU] via SUBCUTANEOUS

## 2016-07-02 MED ORDER — ORAL CARE MOUTH RINSE
15.0000 mL | Freq: Four times a day (QID) | OROMUCOSAL | Status: DC
  Administered 2016-07-02 – 2016-07-04 (×4): 15 mL via OROMUCOSAL

## 2016-07-02 MED ORDER — METOPROLOL TARTRATE 5 MG/5ML IV SOLN: 2.5000 mg | INTRAVENOUS | Status: DC | PRN

## 2016-07-02 MED ORDER — ASPIRIN EC 325 MG PO TBEC
325.0000 mg | DELAYED_RELEASE_TABLET | Freq: Every day | ORAL | Status: DC
  Administered 2016-07-03: 325 mg via ORAL
  Filled 2016-07-02: qty 1

## 2016-07-02 MED ORDER — SODIUM CHLORIDE 0.9% FLUSH: 3.0000 mL | INTRAVENOUS | Status: DC | PRN

## 2016-07-02 MED ORDER — PANTOPRAZOLE SODIUM 40 MG PO TBEC
40.0000 mg | DELAYED_RELEASE_TABLET | Freq: Every day | ORAL | Status: DC
  Administered 2016-07-04 – 2016-07-09 (×6): 40 mg via ORAL
  Filled 2016-07-02 (×6): qty 1

## 2016-07-02 MED ORDER — ONDANSETRON HCL 4 MG/2ML IJ SOLN
4.0000 mg | Freq: Four times a day (QID) | INTRAMUSCULAR | Status: DC | PRN
  Administered 2016-07-03: 4 mg via INTRAVENOUS
  Filled 2016-07-02: qty 2

## 2016-07-02 MED ORDER — BUPIVACAINE 0.5 % ON-Q PUMP SINGLE CATH 400 ML
400.0000 mL | INJECTION | Status: DC
  Filled 2016-07-02: qty 400

## 2016-07-02 MED ORDER — CEFUROXIME SODIUM 1.5 G IJ SOLR
1.5000 g | Freq: Two times a day (BID) | INTRAMUSCULAR | Status: AC
  Administered 2016-07-02 – 2016-07-04 (×4): 1.5 g via INTRAVENOUS
  Filled 2016-07-02 (×5): qty 1.5

## 2016-07-02 MED ORDER — SODIUM CHLORIDE 0.9 % IV SOLN
250.0000 mL | INTRAVENOUS | Status: DC
  Administered 2016-07-03: 250 mL via INTRAVENOUS

## 2016-07-02 MED ORDER — ROCURONIUM BROMIDE 10 MG/ML (PF) SYRINGE
PREFILLED_SYRINGE | INTRAVENOUS | Status: DC | PRN
  Administered 2016-07-02 (×4): 50 mg via INTRAVENOUS

## 2016-07-02 MED ORDER — MIDAZOLAM HCL 2 MG/2ML IJ SOLN: 2.0000 mg | INTRAMUSCULAR | Status: DC | PRN

## 2016-07-02 MED ORDER — CHLORHEXIDINE GLUCONATE 0.12 % MT SOLN
15.0000 mL | Freq: Once | OROMUCOSAL | Status: AC
  Administered 2016-07-02: 15 mL via OROMUCOSAL
  Filled 2016-07-02: qty 15

## 2016-07-02 MED ORDER — MIDAZOLAM HCL 10 MG/2ML IJ SOLN
INTRAMUSCULAR | Status: AC
  Filled 2016-07-02: qty 2

## 2016-07-02 MED ORDER — ASPIRIN 81 MG PO CHEW: 324.0000 mg | CHEWABLE_TABLET | Freq: Every day | ORAL | Status: DC

## 2016-07-02 MED ORDER — SODIUM CHLORIDE 0.9% FLUSH
3.0000 mL | Freq: Two times a day (BID) | INTRAVENOUS | Status: DC
  Administered 2016-07-03 (×2): 3 mL via INTRAVENOUS

## 2016-07-02 MED ORDER — METOPROLOL TARTRATE 25 MG/10 ML ORAL SUSPENSION: 12.5000 mg | Freq: Two times a day (BID) | ORAL | Status: DC

## 2016-07-02 MED ORDER — PROTAMINE SULFATE 10 MG/ML IV SOLN
INTRAVENOUS | Status: DC | PRN
  Administered 2016-07-02: 15 mg via INTRAVENOUS

## 2016-07-02 MED ORDER — FENTANYL CITRATE (PF) 250 MCG/5ML IJ SOLN
INTRAMUSCULAR | Status: AC
  Filled 2016-07-02: qty 25

## 2016-07-02 MED ORDER — PHENYLEPHRINE HCL 10 MG/ML IJ SOLN: 0.0000 ug/min | INTRAVENOUS | Status: DC

## 2016-07-02 MED ORDER — MIDAZOLAM HCL 5 MG/5ML IJ SOLN
INTRAMUSCULAR | Status: DC | PRN
  Administered 2016-07-02: 2 mg via INTRAVENOUS
  Administered 2016-07-02: 3 mg via INTRAVENOUS
  Administered 2016-07-02: 1 mg via INTRAVENOUS

## 2016-07-02 MED ORDER — ACETAMINOPHEN 160 MG/5ML PO SOLN
1000.0000 mg | Freq: Four times a day (QID) | ORAL | Status: DC
  Administered 2016-07-02: 1000 mg

## 2016-07-02 MED ORDER — BISACODYL 5 MG PO TBEC
10.0000 mg | DELAYED_RELEASE_TABLET | Freq: Every day | ORAL | Status: DC
  Administered 2016-07-03 – 2016-07-06 (×3): 10 mg via ORAL
  Filled 2016-07-02 (×3): qty 2

## 2016-07-02 MED FILL — Potassium Chloride Inj 2 mEq/ML: INTRAVENOUS | Qty: 40 | Status: AC

## 2016-07-02 MED FILL — Heparin Sodium (Porcine) Inj 1000 Unit/ML: INTRAMUSCULAR | Qty: 30 | Status: AC

## 2016-07-02 MED FILL — Magnesium Sulfate Inj 50%: INTRAMUSCULAR | Qty: 10 | Status: AC

## 2016-07-02 SURGICAL SUPPLY — 104 items
ADAPTER CARDIO PERF ANTE/RETRO (ADAPTER) ×4 IMPLANT
ARTICLIP LAA PROCLIP II 40 (Clip) ×4 IMPLANT
BAG DECANTER FOR FLEXI CONT (MISCELLANEOUS) ×4 IMPLANT
BLADE STERNUM SYSTEM 6 (BLADE) ×4 IMPLANT
BLADE SURG 11 STRL SS (BLADE) ×4 IMPLANT
BLOOD HAEMOCONCENTR 700 MIDI (MISCELLANEOUS) ×4 IMPLANT
CANISTER SUCTION 2500CC (MISCELLANEOUS) ×8 IMPLANT
CANNULA FEM VENOUS REMOTE 22FR (CANNULA) ×4 IMPLANT
CANNULA FEMORAL ART 14 SM (MISCELLANEOUS) ×4 IMPLANT
CANNULA GUNDRY RCSP 15FR (MISCELLANEOUS) ×4 IMPLANT
CANNULA OPTISITE PERFUSION 16F (CANNULA) ×4 IMPLANT
CANNULA OPTISITE PERFUSION 18F (CANNULA) ×4 IMPLANT
CANNULA SUMP PERICARDIAL (CANNULA) ×8 IMPLANT
CATH KIT ON Q 5IN SLV (PAIN MANAGEMENT) IMPLANT
CATH KIT ON-Q SILVERSOAK 5IN (CATHETERS) ×4 IMPLANT
CONN ST 1/4X3/8  BEN (MISCELLANEOUS) ×2
CONN ST 1/4X3/8 BEN (MISCELLANEOUS) ×6 IMPLANT
CONNECTOR 1/2X3/8X1/2 3 WAY (MISCELLANEOUS) ×1
CONNECTOR 1/2X3/8X1/2 3WAY (MISCELLANEOUS) ×3 IMPLANT
CONT SPEC 4OZ CLIKSEAL STRL BL (MISCELLANEOUS) ×4 IMPLANT
CONT SPEC STER OR (MISCELLANEOUS) ×4 IMPLANT
COVER BACK TABLE 24X17X13 BIG (DRAPES) ×4 IMPLANT
CRADLE DONUT ADULT HEAD (MISCELLANEOUS) ×4 IMPLANT
DERMABOND ADVANCED (GAUZE/BANDAGES/DRESSINGS) ×2
DERMABOND ADVANCED .7 DNX12 (GAUZE/BANDAGES/DRESSINGS) ×6 IMPLANT
DEVICE ATRICLIP LAA PRCLPII 40 (Clip) ×3 IMPLANT
DEVICE PMI PUNCTURE CLOSURE (MISCELLANEOUS) ×4 IMPLANT
DEVICE SUT CK QUICK LOAD MINI (Prosthesis & Implant Heart) ×8 IMPLANT
DEVICE TROCAR PUNCTURE CLOSURE (ENDOMECHANICALS) ×4 IMPLANT
DRAIN CHANNEL 28F RND 3/8 FF (WOUND CARE) ×8 IMPLANT
DRAPE BILATERAL SPLIT (DRAPES) ×4 IMPLANT
DRAPE C-ARM 42X72 X-RAY (DRAPES) ×4 IMPLANT
DRAPE CV SPLIT W-CLR ANES SCRN (DRAPES) ×4 IMPLANT
DRAPE INCISE IOBAN 66X45 STRL (DRAPES) ×12 IMPLANT
DRAPE SLUSH/WARMER DISC (DRAPES) ×4 IMPLANT
DRSG COVADERM 4X8 (GAUZE/BANDAGES/DRESSINGS) ×4 IMPLANT
ELECT BLADE 6.5 EXT (BLADE) ×4 IMPLANT
ELECT REM PT RETURN 9FT ADLT (ELECTROSURGICAL) ×8
ELECTRODE REM PT RTRN 9FT ADLT (ELECTROSURGICAL) ×6 IMPLANT
FELT TEFLON 1X6 (MISCELLANEOUS) ×8 IMPLANT
FEMORAL VENOUS CANN RAP (CANNULA) IMPLANT
GLOVE BIO SURGEON STRL SZ 6 (GLOVE) ×12 IMPLANT
GLOVE BIO SURGEON STRL SZ 6.5 (GLOVE) ×12 IMPLANT
GLOVE BIOGEL PI IND STRL 6.5 (GLOVE) ×9 IMPLANT
GLOVE BIOGEL PI INDICATOR 6.5 (GLOVE) ×3
GLOVE ORTHO TXT STRL SZ7.5 (GLOVE) ×12 IMPLANT
GOWN STRL REUS W/ TWL LRG LVL3 (GOWN DISPOSABLE) ×12 IMPLANT
GOWN STRL REUS W/TWL LRG LVL3 (GOWN DISPOSABLE) ×4
IV NS 1000ML (IV SOLUTION) ×1
IV NS 1000ML BAXH (IV SOLUTION) ×3 IMPLANT
IV NS IRRIG 3000ML ARTHROMATIC (IV SOLUTION) ×8 IMPLANT
KIT BASIN OR (CUSTOM PROCEDURE TRAY) ×4 IMPLANT
KIT DILATOR VASC 18G NDL (KITS) ×4 IMPLANT
KIT DRAINAGE VACCUM ASSIST (KITS) ×4 IMPLANT
KIT ROOM TURNOVER OR (KITS) ×4 IMPLANT
KIT SUCTION CATH 14FR (SUCTIONS) ×4 IMPLANT
KIT SUT CK MINI COMBO 4X17 (Prosthesis & Implant Heart) ×4 IMPLANT
LEAD PACING MYOCARDI (MISCELLANEOUS) ×4 IMPLANT
LINE VENT (MISCELLANEOUS) ×4 IMPLANT
NEEDLE AORTIC ROOT 14G 7F (CATHETERS) ×4 IMPLANT
NS IRRIG 1000ML POUR BTL (IV SOLUTION) ×20 IMPLANT
PACK OPEN HEART (CUSTOM PROCEDURE TRAY) ×4 IMPLANT
PAD ARMBOARD 7.5X6 YLW CONV (MISCELLANEOUS) ×8 IMPLANT
PAD ELECT DEFIB RADIOL ZOLL (MISCELLANEOUS) ×4 IMPLANT
PATCH CORMATRIX 4CMX7CM (Prosthesis & Implant Heart) ×4 IMPLANT
RING MITRAL MEMO 3D 32MM SMD32 (Prosthesis & Implant Heart) ×4 IMPLANT
SET CANNULATION TOURNIQUET (MISCELLANEOUS) ×4 IMPLANT
SET CARDIOPLEGIA MPS 5001102 (MISCELLANEOUS) ×4 IMPLANT
SET IRRIG TUBING LAPAROSCOPIC (IRRIGATION / IRRIGATOR) ×4 IMPLANT
SOLUTION ANTI FOG 6CC (MISCELLANEOUS) ×4 IMPLANT
SPONGE GAUZE 4X4 12PLY STER LF (GAUZE/BANDAGES/DRESSINGS) ×4 IMPLANT
SUT BONE WAX W31G (SUTURE) ×4 IMPLANT
SUT E-PACK MINIMALLY INVASIVE (SUTURE) ×4 IMPLANT
SUT ETHIBOND (SUTURE) ×8 IMPLANT
SUT ETHIBOND 2 0 SH (SUTURE) ×4 IMPLANT
SUT ETHIBOND 2-0 RB-1 WHT (SUTURE) ×8 IMPLANT
SUT ETHIBOND X763 2 0 SH 1 (SUTURE) ×4 IMPLANT
SUT GORETEX CV 4 TH 22 36 (SUTURE) ×4 IMPLANT
SUT GORETEX CV-5THC-13 36IN (SUTURE) ×48 IMPLANT
SUT GORETEX CV4 TH-18 (SUTURE) ×8 IMPLANT
SUT PROLENE 3 0 SH1 36 (SUTURE) ×16 IMPLANT
SUT PTFE CHORD X 20MM (SUTURE) ×4 IMPLANT
SUT PTFE CHORD X 24MM (SUTURE) ×12 IMPLANT
SUT SILK  1 MH (SUTURE) ×1
SUT SILK 1 MH (SUTURE) ×3 IMPLANT
SUT SILK 2 0 SH CR/8 (SUTURE) IMPLANT
SUT SILK 3 0 SH CR/8 (SUTURE) IMPLANT
SUT VIC AB 2-0 CTX 36 (SUTURE) IMPLANT
SUT VIC AB 3-0 SH 8-18 (SUTURE) IMPLANT
SUT VICRYL 2 TP 1 (SUTURE) IMPLANT
SYRINGE 10CC LL (SYRINGE) ×4 IMPLANT
SYSTEM SAHARA CHEST DRAIN ATS (WOUND CARE) ×4 IMPLANT
TAPE CLOTH SURG 4X10 WHT LF (GAUZE/BANDAGES/DRESSINGS) ×4 IMPLANT
TAPE PAPER 2X10 WHT MICROPORE (GAUZE/BANDAGES/DRESSINGS) ×4 IMPLANT
TOWEL OR 17X24 6PK STRL BLUE (TOWEL DISPOSABLE) ×8 IMPLANT
TOWEL OR 17X26 10 PK STRL BLUE (TOWEL DISPOSABLE) ×8 IMPLANT
TRAY FOLEY IC TEMP SENS 16FR (CATHETERS) ×4 IMPLANT
TROCAR XCEL BLADELESS 5X75MML (TROCAR) ×4 IMPLANT
TROCAR XCEL NON-BLD 11X100MML (ENDOMECHANICALS) ×8 IMPLANT
TUBE SUCT INTRACARD DLP 20F (MISCELLANEOUS) ×4 IMPLANT
TUNNELER SHEATH ON-Q 11GX8 DSP (PAIN MANAGEMENT) ×4 IMPLANT
UNDERPAD 30X30 (UNDERPADS AND DIAPERS) ×4 IMPLANT
WATER STERILE IRR 1000ML POUR (IV SOLUTION) ×8 IMPLANT
WIRE .035 3MM-J 145CM (WIRE) ×4 IMPLANT

## 2016-07-02 NOTE — Op Note (Signed)
CARDIOTHORACIC SURGERY OPERATIVE NOTE  Date of Procedure:   07/02/2016  Preoperative Diagnosis:  Severe Mitral Regurgitation  Postoperative Diagnosis:  Same  Procedure:    Minimally-Invasive Mitral Valve Repair  Complex valvuloplasty including quadrangular resection of posterior leaflet  Folding plasty of posterior leaflet  Gore-tex neochord placement x6  Sorin Memo 3D Ring Annuloplasty (size 70mm, catalog #[id number], serial [id number])  Clipping of Left Atrial Appendage (Atricure left atrial clip, size 40 mm)  Surgeon: Valentina Gu. 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  Large flail segment of posterior leaflet with multiple ruptured chordae tendineae  Type II dysfunction with severe mitral regurgitation  Normal LV systolic function  No residual mitral regurgitation after successful valve repair                   BRIEF CLINICAL NOTE AND INDICATIONS FOR SURGERY  Patient is a 70 year old female with history of mitral valve prolapse causing mitral regurgitation, palpitations with frequent PVCs, long-standing symptoms of exertional shortness of breath, and long-standing tobacco abuse with COPD who was transferred from [facility] for evaluation of severe mitral regurgitation. The patient states that she first began to experience symptoms of exertional shortness of breath and palpitations nearly 5 years ago. She was noted to have mitral valve prolapse and initially was evaluated by Dr. Ubaldo Glassing. Symptoms progressed and she was referred to Dr. Heath Lark has been following her since December 2015. At that time the patient complained of significant exertional shortness of breath and frequent palpitations. Echocardiogram performed at that time revealed normal left ventricular systolic function with mitral valve prolapse  and "moderate to severe"mitral regurgitation. A nuclear stress test was performed and felt to be low risk for myocardial ischemia. She was started on metoprolol and symptoms of palpitations improved. The patient has been followed intermittently ever since by Dr. Fletcher Anon. A Holter monitor was performed documenting frequent PVCs. The patient states that over the past year she has developed further progression of symptoms of exertional shortness of breath and fatigue with increasing episodes of palpitations. She was seen in follow-up recently by Dr. Fletcher Anon and a follow-up echocardiogram was performed 06/05/2016. This revealed normal left ventricular size and systolic function with posterior leaflet prolapse and "moderate eccentric regurgitation". Arrangements for were made for a repeat Holter monitor because of worsening symptoms of palpitations.   The patient states that symptoms of shortness of breath continued to progress to the point where she began to experience trouble breathing even at rest. She presented acutely to the emergency department at [facility] on 06/14/2016 with resting shortness of breath withbaseline oxygen saturation was 88% on room air. She also complained of cough and baseline white blood count was elevated at 20,400. The patient was admitted to the hospital and placed on BiPAP with symptomatic improvement. CT scan of the chest was performed to rule out pulmonary embolism but was notable for the presence of interstitial edema and groundglass opacities suspicious for possible pneumonia. She was treated with intravenous antibiotics, steroids and nebulizersfor presumed community acquired pneumonia and bronchitis. Following admission she had a brief episode of supraventricular tachycardia which converted to sinus rhythm following adenosine administration. Cardiology consultation was obtained and the patient was started on diuretic therapy. The patient gradually improved over the next several  days. She underwent transesophageal echocardiogram 06/18/2016 confirmed the presence of mitral valve prolapse with flail segment of posterior leaflet  and severe mitral regurgitation. Left and right heart catheterization was performed 06/19/2016 and notable for the absence of significant coronary artery disease with severe mitral regurgitation and mildly elevated pulmonary artery pressures. The patient was transferred to [facility] for further management.  She was originally seen in consultation on 06/20/2016 during her recent hospitalization. At that time she was recovering from acute pneumonia that was complicated by acute exacerbation of chronic diastolic congestive heart failure. The patient has been seen in consultation and counseled at length regarding the indications, risks and potential benefits of surgery.  All questions have been answered, and the patient provides full informed consent for the operation as described.    DETAILS OF THE OPERATIVE PROCEDURE  Preparation:  The patient is brought to the operating room on the above mentioned date and central monitoring was established by the anesthesia team including placement of Swan-Ganz catheter through the left internal jugular vein.  A radial arterial line is placed. The patient is placed in the supine position on the operating table.  Intravenous antibiotics are administered. General endotracheal anesthesia is induced uneventfully. The patient is initially intubated using a dual lumen endotracheal tube.  A Foley catheter is placed.  Baseline transesophageal echocardiogram was performed.  Findings were notable for myxomatous degenerative disease with an obvious large flail segment of the posterior leaflet with multiple ruptured primary chordae tendineae and severe mitral regurgitation. The jet of regurgitation was bifurcated and directed eccentrically. There was flow reversal in the pulmonary veins. The left atrium was dilated.  There was mild left ventricular chamber enlargement with normal left ventricular systolic function. The aortic valve appeared normal. Right ventricular size and function was normal. There was mild tricuspid regurgitation.  A soft roll is placed behind the patient's left scapula and the neck gently extended and turned to the left.   The patient's right neck, chest, abdomen, both groins, and both lower extremities are prepared and draped in a sterile manner. A time out procedure is performed.   Surgical Approach:  A right miniature anterolateral thoracotomy incision is performed. The incision is placed just lateral to and superior to the right nipple. The pectoralis major muscle is retracted medially and completely preserved. The right pleural space is entered through the 3rd intercostal space. A soft tissue retractor is placed.  Two 11 mm ports are placed through separate stab incisions inferiorly. The right pleural space is insufflated continuously with carbon dioxide gas through the posterior port during the remainder of the operation.   A longitudinal incision is made in the pericardium 3 cm anterior to the phrenic nerve and silk traction sutures are placed on either side of the incision for exposure.   Extracorporeal Cardiopulmonary Bypass:   A small incision is made in the right inguinal crease and the anterior surface of the right common femoral artery and right common femoral vein are identified.  The patient is placed in Trendelenburg position. The right internal jugular vein is cannulated with Seldinger technique and a guidewire advanced into the right atrium. The patient is heparinized systemically. The right internal jugular vein is cannulated with a 14 [place] pediatric femoral venous cannula. Pursestring sutures are placed on the anterior surface of the right common femoral vein and right common femoral artery. The right common femoral vein is cannulated with the Seldinger technique and a  guidewire is advanced under transesophageal echocardiogram guidance through the right atrium. The femoral vein is cannulated with a long 22 French femoral venous cannula. The right common  femoral artery is cannulated with Seldinger technique and a flexible guidewire is advanced until it can be appreciated intraluminally in the descending thoracic aorta on transesophageal echocardiogram. The femoral artery is cannulated with an 18 French femoral arterial cannula.  Adequate heparinization is verified.     The entire pre-bypass portion of the operation was notable for stable hemodynamics.  Cardiopulmonary bypass was begun.  Vacuum assist venous drainage is utilized. The incision in the pericardium is extended in both directions. Venous drainage and exposure are notably excellent.    Clipping of Left Atrial Appendage:  The left atrial appendage is obliterated using an Atricure left atrial appendage clip (Atriclip, size 8mm).  The clip is applied under thoracoscopic visualization posterior to the aorta and pulmonary artery through the oblique sinus.  The clip was applied with transesophageal echocardiographic confirmation that the clip satisfactorily obliterates the appendage.   Myocardial Protection:  A retrograde cardioplegia cannula is placed through the right atrium into the coronary sinus using transesophageal echocardiogram guidance.  An antegrade cardioplegia cannula is placed in the ascending aorta.  The patient is cooled to 28C systemic temperature.  The aortic cross clamp is applied and cold blood cardioplegia is delivered initially in an antegrade fashion through the aortic root.   Supplemental cardioplegia is given retrograde through the coronary sinus catheter. The initial cardioplegic arrest is rapid with early diastolic arrest.  Repeat doses of cardioplegia are administered intermittently every 20 to 30 minutes throughout the entire cross clamp portion of the operation through the aortic root  and through the coronary sinus catheter in order to maintain completely flat electrocardiogram.  Myocardial protection was felt to be excellent.   Mitral Valve Repair:  A left atriotomy incision is performed posteriorly through the interatrial groove. The incision is continued part way across the back wall of the left atrium after opening the oblique sinus. A retractor blade was placed and attached to the left atrial lift retractor. Visualization and exposure of the mitral valve was excellent.   The mitral valve was inspected and notable for forme fruste variant of Barlow's myxomatous degenerative disease. The mitral valve was relatively large with somewhat redundant leaflet tissue, particular involving the posterior leaflet. There was a large flail segment incorporating most of the P2 segment of the posterior leaflet with multiple ruptured primary chordae tendineae. There was essentially no chordal support for the P2 segment. There was mild billowing of P1. There was mild billowing of the anterior leaflet.   Interrupted 2-0 Ethibond horizontal mattress sutures are placed circumferentially around the entire mitral valve annulus. The sutures will ultimately be utilized for ring annuloplasty, and at this juncture there are utilized to suspend the valve symmetrically.  The large flail segment involving P2 was repaired using a quadrangular resection with folding leaflet plasty. Approximately 50% of the surface area of P2 was resected. The majority of the resection was on the P3 side of the P2 segment extending to the indentation or cleft between P2 and P3. The remainder of P2 was mobilized off of the posterior annulus and a folding plasty performed using interrupted everting simple CV 5 Gore-Tex suture to reattach the P2 segment to the posterior annulus and to close the vertical defect between P2 and P3.  Artificial neochord placement was performed using Chord-X multi-strand CV-4 Goretex pre-measured loops.   The appropriate cord length was measured from corresponding normal length primary cords from the P1 segment of the posterior leaflet. The papillary muscle suture of the Chord-X multi-strand suture was placed  through the head of the posterior papillary muscle in a horizontal mattress fashion and tied over Teflon felt pledgets. Two of the three pre-measured loops were then reimplanted into the free margin of the P2 segment of the posterior leaflet on the P3 side of P2.  The third pre-measured loop was discarded. A second multistrand Chord-X suture is placed based on the anterior papillary muscle.  The papillary muscle suture of the Chord-X multi-strand suture was placed through the head of the anterior papillary muscle in a horizontal mattress fashion and tied over Teflon felt pledgets. One of the three pre-measured loops were then reimplanted into the free margin of the P2 segment of the posterior leaflet on the P1 side of P2.  The remaining 2 premeasured loops were felt unnecessary and discarded.  The valve is tested with saline and appears reasonably competent even prior to ring annuloplasty.  The valve is sized to accept a 106mm annuloplasty ring based upon the distance between the left and right commissures, the height and the surface area of the anterior leaflet.  A Sorin Memo 3D annuloplasty ring (size 83mm, catalog # [id number], serial # [id number]) is implanted uneventfully.  All ring sutures were secured using a Cor-knot device.  The valve is again tested with saline and appears to be perfectly competent with a broad symmetrical line of coaptation of the anterior and posterior leaflet. There is no residual leak. Rewarming is begun.  The atriotomy was closed using a 2-layer closure of running 3-0 Prolene suture after placing a sump drain across the mitral valve to serve as a left ventricular vent.  One final dose of warm retrograde "hot shot" cardioplegia was administered retrograde through the coronary sinus  catheter while all air was evacuated through the aortic root.  The aortic cross clamp was removed after a total cross clamp time of 141 minutes.   Procedure Completion:  Epicardial pacing wires are fixed to the inferior wall of the right ventricule and to the right atrial appendage. The patient is rewarmed to 37C temperature. The left ventricular vent is removed.  The patient is ventilated and flow volumes turndown while the mitral valve repair is inspected using transesophageal echocardiogram. The valve repair appears intact with no residual leak. The antegrade cardioplegia cannula is now removed. The patient is weaned and disconnected from cardiopulmonary bypass.  The patient's rhythm at separation from bypass was AV paced.  The patient was weaned from bypass without any inotropic support. Total cardiopulmonary bypass time for the operation was 188 minutes.  Followup transesophageal echocardiogram performed after separation from bypass revealed a well-seated annuloplasty ring in the mitral position with a normal functioning mitral valve. There was no residual leak.  Left ventricular function was unchanged from preoperatively.  The femoral arterial and venous cannulae were removed uneventfully. There was a palpable pulse in the distal right common femoral artery after removal of the cannula. Protamine was administered to reverse the anticoagulation. The right internal jugular cannula was removed and manual pressure held on the neck for 15 minutes.  Single lung ventilation was begun. The atriotomy closure was inspected for hemostasis. The pericardial sac was drained using a 28 French Bard drain placed through the anterior port incision.  The pericardium was closed using a patch of core matrix bovine submucosal tissue patch. The right pleural space is irrigated with saline solution and inspected for hemostasis. The right pleural space was drained using a 28 French Bard drain placed through the posterior  port incision. A single-lumen On-Q  catheter was placed to facilitate postoperative analgesia. The catheter was tunneled through the subcutaneous tissues to the posterior chest tube incision and then tunneled into the subpleural space to cover the second through the sixth intercostal nerve roots.  The catheter is flushed with 0.5% bupivacaine solution and connected to a continuous infusion pump.  The miniature thoracotomy incision was closed in multiple layers in routine fashion. The right groin incision was inspected for hemostasis and closed in multiple layers in routine fashion.  The post-bypass portion of the operation was notable for stable rhythm and hemodynamics.  No blood products were administered during the operation.   Disposition:  The patient tolerated the procedure well.  The patient was reintubated using a single lumen endotracheal tube and subsequently transported to the surgical intensive care unit in stable condition. There were no intraoperative complications. All sponge instrument and needle counts are verified correct at completion of the operation.    Valentina Gu. Roxy Manns MD 07/02/2016 2:38 PM

## 2016-07-02 NOTE — Anesthesia Procedure Notes (Signed)
Central Venous Catheter Insertion Performed by: anesthesiologist 07/02/2016 8:08 AM Patient location: Pre-op. Preanesthetic checklist: patient identified, IV checked, site marked, risks and benefits discussed, surgical consent, monitors and equipment checked, pre-op evaluation, timeout performed and anesthesia consent Position: Trendelenburg Lidocaine 1% used for infiltration Landmarks identified and Seldinger technique used Catheter size: 8.5 Fr PA cath was placed.Sheath introducer Swan type and PA catheter depth:thermodilutionProcedure performed using ultrasound guided technique. Attempts: 1 Following insertion, line sutured and dressing applied. Post procedure assessment: blood return through all ports, free fluid flow and no air. Patient tolerated the procedure well with no immediate complications. Additional procedure comments: PA catheter:  Routine monitors. Timeout, sterile prep, drape, FBP L neck.  Trendelenburg position.  1% Lido local, finder and trocar LIJ 1st pass with US guidance.  Cordis placed over J wire. PA catheter in easily.  Sterile dressing applied.  Patient tolerated well, VSS.  Jenita Seashore, MD.

## 2016-07-02 NOTE — Progress Notes (Signed)
  Echocardiogram Echocardiogram Transesophageal has been performed.  Nicole Norman M 07/02/2016, 9:28 AM

## 2016-07-02 NOTE — Progress Notes (Signed)
Patient ID: Nicole Norman, female   DOB: [birth year], 70 y.o.   MRN: [id number] EVENING ROUNDS NOTE :     [address]       [place] [address]             [phone]                 Day of Surgery Procedure(s) (LRB): MINIMALLY INVASIVE MITRAL VALVE REPAIR (MVR) (Right) TRANSESOPHAGEAL ECHOCARDIOGRAM (TEE) (N/A) CLIPPING OF ATRIAL APPENDAGE (N/A)  Total Length of Stay:  LOS: 0 days  BP (!) 79/59   Pulse 80   Temp (!) 96.3 F (35.7 C)   Resp 12   Ht 4' 11.5" (1.511 m)   Wt 121 lb (54.9 kg)   SpO2 98%   BMI 24.03 kg/m   .Intake/Output      11/28 0701 - 11/29 0700 11/29 0701 - 11/30 0700   I.V. (mL/kg)  2800 (51)   Blood  290   Total Intake(mL/kg)  3090 (56.3)   Urine (mL/kg/hr)  800 (1.5)   Blood  1525 (2.8)   Total Output   2325   Net   +765          . sodium chloride    . sodium chloride 100 mL/hr at 07/02/16 1530  . [START ON 07/03/2016] sodium chloride    . sodium chloride 20 mL/hr at 07/02/16 1544  . dexmedetomidine 0.5 mcg/kg/hr (07/02/16 1550)  . insulin (NOVOLIN-R) infusion 0.9 Units/hr (07/02/16 1530)  . lactated ringers    . lactated ringers 20 mL/hr at 07/02/16 1530  . nitroGLYCERIN    . phenylephrine (NEO-SYNEPHRINE) Adult infusion 25 mcg/min (07/02/16 1530)     Lab Results  Component Value Date   WBC 18.6 (H) 07/02/2016   HGB 10.6 (L) 07/02/2016   HCT 31.7 (L) 07/02/2016   PLT 213 07/02/2016   GLUCOSE 143 (H) 07/02/2016   CHOL 138 06/16/2016   TRIG 112 06/16/2016   HDL 50 06/16/2016   LDLCALC 66 06/16/2016   ALT 13 (L) 06/30/2016   AST 22 06/30/2016   NA 138 07/02/2016   K 4.1 07/02/2016   CL 102 07/02/2016   CREATININE 0.70 07/02/2016   BUN 7 07/02/2016   CO2 23 06/30/2016   TSH 1.287 06/14/2016   INR 1.30 07/02/2016   HGBA1C 5.5 06/30/2016   Surgery today, not awake yet Not bleeding A paced  Stable   Grace Isaac MD  Beeper [phone] Office [phone] 07/02/2016 4:46 PM

## 2016-07-02 NOTE — Anesthesia Procedure Notes (Signed)
Procedure Name: Intubation Date/Time: 07/02/2016 9:10 AM Performed by: Clearnce Sorrel Pre-anesthesia Checklist: Patient identified, Emergency Drugs available, Suction available, Patient being monitored and Timeout performed Patient Re-evaluated:Patient Re-evaluated prior to inductionOxygen Delivery Method: Circle system utilized Preoxygenation: Pre-oxygenation with 100% oxygen Intubation Type: IV induction Ventilation: Mask ventilation without difficulty Laryngoscope Size: Mac and 3 Grade View: Grade I Tube type: Oral Endobronchial tube: Double lumen EBT and 35 Fr Number of attempts: 1 Airway Equipment and Method: Stylet and Fiberoptic brochoscope Placement Confirmation: ETT inserted through vocal cords under direct vision,  positive ETCO2,  breath sounds checked- equal and bilateral and CO2 detector Secured at: 31 cm Tube secured with: Tape Dental Injury: Teeth and Oropharynx as per pre-operative assessment

## 2016-07-02 NOTE — Brief Op Note (Addendum)
07/02/2016  1:23 PM  PATIENT:  Nicole Norman  70 y.o. female  PRE-OPERATIVE DIAGNOSIS: 1. SEVERE MR 2. MITRAL VALVE PROLAPSE  POST-OPERATIVE DIAGNOSIS:  1. SEVERE MR 2. 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

## 2016-07-02 NOTE — Interval H&P Note (Signed)
History and Physical Interval Note:  07/02/2016 7:22 AM  [name]  has presented today for surgery, with the diagnosis of SEVERE MR  The various methods of treatment have been discussed with the patient and family. After consideration of risks, benefits and other options for treatment, the patient has consented to  Procedure(s): MINIMALLY INVASIVE MITRAL VALVE REPAIR (MVR) (Right) TRANSESOPHAGEAL ECHOCARDIOGRAM (TEE) (N/A) as a surgical intervention .  The patient's history has been reviewed, patient examined, no change in status, stable for surgery.  I have reviewed the patient's chart and labs.  Questions were answered to the patient's satisfaction.     Nicole Norman

## 2016-07-02 NOTE — Transfer of Care (Signed)
Immediate Anesthesia Transfer of Care Note  Patient: STOREE VETTEL  Procedure(s) Performed: Procedure(s): MINIMALLY INVASIVE MITRAL VALVE REPAIR (MVR) (Right) TRANSESOPHAGEAL ECHOCARDIOGRAM (TEE) (N/A) CLIPPING OF ATRIAL APPENDAGE (N/A)  Patient Location: SICU  Anesthesia Type:General  Level of Consciousness: Patient remains intubated per anesthesia plan  Airway & Oxygen Therapy: Patient remains intubated per anesthesia plan  Post-op Assessment: Report given to RN and Post -op Vital signs reviewed and stable  Post vital signs: Reviewed and stable  Last Vitals:  Vitals:   07/02/16 0656  BP: (!) 111/54  Pulse: 66  Resp: 20  Temp: 36.8 C    Last Pain:  Vitals:   07/02/16 0656  TempSrc: Oral      Patients Stated Pain Goal: 2 ([id number])  Complications: No apparent anesthesia complications

## 2016-07-02 NOTE — OR Nursing (Signed)
13:50 - 45 minute call to SICU, 14:35 - 20 minute call to SICU

## 2016-07-03 ENCOUNTER — Encounter (HOSPITAL_COMMUNITY): Payer: Self-pay | Admitting: Thoracic Surgery (Cardiothoracic Vascular Surgery)

## 2016-07-03 ENCOUNTER — Inpatient Hospital Stay (HOSPITAL_COMMUNITY): Payer: PPO

## 2016-07-03 LAB — POCT I-STAT, CHEM 8
BUN: 7 mg/dL (ref 6–20)
BUN: 7 mg/dL (ref 6–20)
CALCIUM ION: 1.08 mmol/L — AB (ref 1.15–1.40)
CREATININE: 0.8 mg/dL (ref 0.44–1.00)
Calcium, Ion: 1.11 mmol/L — ABNORMAL LOW (ref 1.15–1.40)
Chloride: 109 mmol/L (ref 101–111)
Chloride: 102 mmol/L (ref 101–111)
Creatinine, Ser: 0.7 mg/dL (ref 0.44–1.00)
GLUCOSE: 142 mg/dL — AB (ref 65–99)
Glucose, Bld: 108 mg/dL — ABNORMAL HIGH (ref 65–99)
HCT: 28 % — ABNORMAL LOW (ref 36.0–46.0)
HCT: 27 % — ABNORMAL LOW (ref 36.0–46.0)
HEMOGLOBIN: 9.5 g/dL — AB (ref 12.0–15.0)
HEMOGLOBIN: 9.2 g/dL — AB (ref 12.0–15.0)
POTASSIUM: 3.7 mmol/L (ref 3.5–5.1)
Potassium: 4.3 mmol/L (ref 3.5–5.1)
SODIUM: 139 mmol/L (ref 135–145)
Sodium: 141 mmol/L (ref 135–145)
TCO2: 21 mmol/L (ref 0–100)
TCO2: 23 mmol/L (ref 0–100)

## 2016-07-03 LAB — GLUCOSE, CAPILLARY
GLUCOSE-CAPILLARY: 119 mg/dL — AB (ref 65–99)
GLUCOSE-CAPILLARY: 165 mg/dL — AB (ref 65–99)
GLUCOSE-CAPILLARY: 165 mg/dL — AB (ref 65–99)
Glucose-Capillary: 115 mg/dL — ABNORMAL HIGH (ref 65–99)
Glucose-Capillary: 123 mg/dL — ABNORMAL HIGH (ref 65–99)
Glucose-Capillary: 157 mg/dL — ABNORMAL HIGH (ref 65–99)
Glucose-Capillary: 98 mg/dL (ref 65–99)

## 2016-07-03 LAB — CBC
HEMATOCRIT: 28 % — AB (ref 36.0–46.0)
HEMATOCRIT: 29.3 % — AB (ref 36.0–46.0)
HEMOGLOBIN: 9.3 g/dL — AB (ref 12.0–15.0)
HEMOGLOBIN: 9.6 g/dL — AB (ref 12.0–15.0)
MCH: 30.3 pg (ref 26.0–34.0)
MCH: 30.6 pg (ref 26.0–34.0)
MCHC: 33.2 g/dL (ref 30.0–36.0)
MCHC: 32.8 g/dL (ref 30.0–36.0)
MCV: 91.2 fL (ref 78.0–100.0)
MCV: 93.3 fL (ref 78.0–100.0)
PLATELETS: 187 10*3/uL (ref 150–400)
Platelets: 161 10*3/uL (ref 150–400)
RBC: 3.07 MIL/uL — AB (ref 3.87–5.11)
RBC: 3.14 MIL/uL — AB (ref 3.87–5.11)
RDW: 13.4 % (ref 11.5–15.5)
RDW: 13.5 % (ref 11.5–15.5)
WBC: 14.6 10*3/uL — ABNORMAL HIGH (ref 4.0–10.5)
WBC: 17.6 10*3/uL — ABNORMAL HIGH (ref 4.0–10.5)

## 2016-07-03 LAB — CREATININE, SERUM
Creatinine, Ser: 0.84 mg/dL (ref 0.44–1.00)
GFR calc non Af Amer: >60 mL/min (ref >60)

## 2016-07-03 LAB — POCT I-STAT 4, (NA,K, GLUC, HGB,HCT)
Glucose, Bld: 78 mg/dL (ref 65–99)
HEMATOCRIT: 30 % — AB (ref 36.0–46.0)
HEMOGLOBIN: 10.2 g/dL — AB (ref 12.0–15.0)
POTASSIUM: 3.4 mmol/L — AB (ref 3.5–5.1)
Sodium: 141 mmol/L (ref 135–145)

## 2016-07-03 LAB — BASIC METABOLIC PANEL
Anion gap: 5 (ref 5–15)
BUN: 7 mg/dL (ref 6–20)
CHLORIDE: 113 mmol/L — AB (ref 101–111)
CO2: 21 mmol/L — AB (ref 22–32)
Calcium: 7.5 mg/dL — ABNORMAL LOW (ref 8.9–10.3)
Creatinine, Ser: 0.76 mg/dL (ref 0.44–1.00)
GFR calc Af Amer: >60 mL/min (ref >60)
GFR calc non Af Amer: >60 mL/min (ref >60)
GLUCOSE: 121 mg/dL — AB (ref 65–99)
POTASSIUM: 3.7 mmol/L (ref 3.5–5.1)
Sodium: 139 mmol/L (ref 135–145)

## 2016-07-03 LAB — POCT I-STAT 3, ART BLOOD GAS (G3+)
Acid-base deficit: 3 mmol/L — ABNORMAL HIGH (ref 0.0–2.0)
Acid-base deficit: 2 mmol/L (ref 0.0–2.0)
Acid-base deficit: 1 mmol/L (ref 0.0–2.0)
BICARBONATE: 21.4 mmol/L (ref 20.0–28.0)
Bicarbonate: 20.5 mmol/L (ref 20.0–28.0)
Bicarbonate: 22.8 mmol/L (ref 20.0–28.0)
O2 SAT: 96 %
O2 Saturation: 99 %
O2 Saturation: 99 %
PCO2 ART: 33.6 mmHg (ref 32.0–48.0)
PH ART: 7.436 (ref 7.350–7.450)
PH ART: 7.474 — AB (ref 7.350–7.450)
PH ART: 7.44 (ref 7.350–7.450)
PO2 ART: 80 mmHg — AB (ref 83.0–108.0)
TCO2: 21 mmol/L (ref 0–100)
TCO2: 22 mmol/L (ref 0–100)
TCO2: 24 mmol/L (ref 0–100)
pCO2 arterial: 30.5 mmHg — ABNORMAL LOW (ref 32.0–48.0)
pCO2 arterial: 29.2 mmHg — ABNORMAL LOW (ref 32.0–48.0)
pO2, Arterial: 128 mmHg — ABNORMAL HIGH (ref 83.0–108.0)
pO2, Arterial: 149 mmHg — ABNORMAL HIGH (ref 83.0–108.0)

## 2016-07-03 LAB — MAGNESIUM
Magnesium: 2.8 mg/dL — ABNORMAL HIGH (ref 1.7–2.4)
Magnesium: 2.1 mg/dL (ref 1.7–2.4)

## 2016-07-03 MED ORDER — ALBUMIN HUMAN 5 % IV SOLN
12.5000 g | Freq: Once | INTRAVENOUS | Status: AC
  Administered 2016-07-03: 12.5 g via INTRAVENOUS

## 2016-07-03 MED ORDER — WARFARIN SODIUM 2.5 MG PO TABS
2.5000 mg | ORAL_TABLET | Freq: Every day | ORAL | Status: DC
  Administered 2016-07-03 – 2016-07-04 (×2): 2.5 mg via ORAL
  Filled 2016-07-03 (×2): qty 1

## 2016-07-03 MED ORDER — WARFARIN - PHYSICIAN DOSING INPATIENT
Freq: Every day | Status: DC
  Administered 2016-07-03 – 2016-07-04 (×2)

## 2016-07-03 MED ORDER — INSULIN ASPART 100 UNIT/ML ~~LOC~~ SOLN
0.0000 [IU] | SUBCUTANEOUS | Status: DC
  Administered 2016-07-03: 4 [IU] via SUBCUTANEOUS
  Administered 2016-07-03: 2 [IU] via SUBCUTANEOUS

## 2016-07-03 MED ORDER — SODIUM CHLORIDE 0.9 % IV SOLN
30.0000 meq | Freq: Once | INTRAVENOUS | Status: AC
  Administered 2016-07-03: 30 meq via INTRAVENOUS

## 2016-07-03 MED ORDER — FUROSEMIDE 10 MG/ML IJ SOLN
20.0000 mg | Freq: Four times a day (QID) | INTRAMUSCULAR | Status: AC
  Administered 2016-07-03 (×2): 20 mg via INTRAVENOUS
  Filled 2016-07-03 (×2): qty 2

## 2016-07-03 MED ORDER — SODIUM CHLORIDE 0.9 % IV SOLN
30.0000 meq | Freq: Once | INTRAVENOUS | Status: AC
  Administered 2016-07-03: 30 meq via INTRAVENOUS
  Filled 2016-07-03: qty 15

## 2016-07-03 MED ORDER — ALBUMIN HUMAN 5 % IV SOLN
INTRAVENOUS | Status: AC
  Filled 2016-07-03: qty 250

## 2016-07-03 MED FILL — Sodium Chloride IV Soln 0.9%: INTRAVENOUS | Qty: 2000 | Status: AC

## 2016-07-03 MED FILL — Electrolyte-R (PH 7.4) Solution: INTRAVENOUS | Qty: 4000 | Status: AC

## 2016-07-03 MED FILL — Lidocaine HCl IV Inj 20 MG/ML: INTRAVENOUS | Qty: 5 | Status: AC

## 2016-07-03 MED FILL — Heparin Sodium (Porcine) Inj 1000 Unit/ML: INTRAMUSCULAR | Qty: 20 | Status: AC

## 2016-07-03 MED FILL — Sodium Bicarbonate IV Soln 8.4%: INTRAVENOUS | Qty: 50 | Status: AC

## 2016-07-03 MED FILL — Mannitol IV Soln 20%: INTRAVENOUS | Qty: 500 | Status: AC

## 2016-07-03 NOTE — Procedures (Signed)
Extubation Procedure Note  Patient Details:   Name: Nicole Norman DOB: [birth year] MRN: [id number]   Airway Documentation:     Evaluation  O2 sats: stable throughout Complications: No apparent complications Patient did tolerate procedure well. Bilateral Breath Sounds: Clear, Diminished   Yes  4l min  Instructed incentive spirometer  Revonda Standard 07/03/2016, 8:49 AM

## 2016-07-03 NOTE — Progress Notes (Addendum)
EVENING ROUNDS NOTE :     [address]       [place], [address]             [phone]                 1 Day Post-Op Procedure(s) (LRB): MINIMALLY INVASIVE MITRAL VALVE REPAIR (MVR) (Right) TRANSESOPHAGEAL ECHOCARDIOGRAM (TEE) (N/A) CLIPPING OF ATRIAL APPENDAGE (N/A)  Total Length of Stay:  LOS: 1 day  BP (!) 91/47   Pulse 80   Temp 99.3 F (37.4 C) (Oral)   Resp (!) 23   Ht 4' 11.5" (1.511 m)   Wt 132 lb 7.9 oz (60.1 kg)   SpO2 96%   BMI 26.31 kg/m   .Intake/Output      11/30 0701 - 12/01 0700   P.O. 240   I.V. (mL/kg) 3 (0)   Blood    IV Piggyback 315   Total Intake(mL/kg) 558 (9.3)   Urine (mL/kg/hr) 2530 (3.1)   Blood    Chest Tube 270 (0.3)   Total Output 2800   Net -2242         . sodium chloride    . nitroGLYCERIN       Lab Results  Component Value Date   WBC 17.6 (H) 07/03/2016   HGB 9.6 (L) 07/03/2016   HCT 29.3 (L) 07/03/2016   PLT 161 07/03/2016   GLUCOSE 108 (H) 07/03/2016   CHOL 138 06/16/2016   TRIG 112 06/16/2016   HDL 50 06/16/2016   LDLCALC 66 06/16/2016   ALT 13 (L) 06/30/2016   AST 22 06/30/2016   NA 139 07/03/2016   K 3.7 07/03/2016   CL 102 07/03/2016   CREATININE 0.84 07/03/2016   BUN 7 07/03/2016   CO2 21 (L) 07/03/2016   TSH 1.287 06/14/2016   INR 1.30 07/02/2016   HGBA1C 5.5 06/30/2016  Patient asleep but was awakened. She has no specific complaint at this time. Started on Coumadin 2.5 mg. Chest tubes with 270 last 12 hours.  PE: CV-Paced at 90. Pulmonary-Decreased at right base. Wounds-Dressing is clean and dry.    07/03/2016 8:38 PM

## 2016-07-03 NOTE — Progress Notes (Signed)
[address]       [place] [address]             [phone]        CARDIOTHORACIC SURGERY PROGRESS NOTE   R1 Day Post-Op Procedure(s) (LRB): MINIMALLY INVASIVE MITRAL VALVE REPAIR (MVR) (Right) TRANSESOPHAGEAL ECHOCARDIOGRAM (TEE) (N/A) CLIPPING OF ATRIAL APPENDAGE (N/A)  Subjective: Resting but awake on vent.  Follows commands.  Looks comfortable.  Objective: Vital signs: BP Readings from Last 1 Encounters:  07/03/16 (!) 110/52   Pulse Readings from Last 1 Encounters:  07/03/16 73   Resp Readings from Last 1 Encounters:  07/03/16 17   Temp Readings from Last 1 Encounters:  07/03/16 99.7 F (37.6 C)    Hemodynamics: PAP: (25-39)/(11-21) 32/18 CO:  [2.6 L/min-4.6 L/min] 3.5 L/min CI:  [1.7 L/min/m2-3.1 L/min/m2] 2.4 L/min/m2  Physical Exam:  Rhythm:   sinus  Breath sounds: clear  Heart sounds:  RRR w/out murmur  Incisions:  Dressings dry, intact  Abdomen:  Soft, non-distended, non-tender  Extremities:  Warm, well-perfused  Chest tubes:  low volume thin serosanguinous output, no air leak    Intake/Output from previous day: 11/29 0701 - 11/30 0700 In: 5773.3 [I.V.:4568.3; Blood:290; IV Piggyback:915] Out: [id number] [Urine:2190; Blood:1525; Chest Tube:390] Intake/Output this shift: No intake/output data recorded.  Lab Results:  CBC: Recent Labs  07/02/16 2209 07/03/16 0353  WBC 15.1* 14.6*  HGB 9.7* 9.3*  HCT 29.4* 28.0*  PLT 175 187    BMET:  Recent Labs  06/30/16 1418  07/02/16 2142 07/02/16 2209 07/03/16 0353  NA 138  < > 141  --  139  K 3.6  < > 4.3  --  3.7  CL 104  < > 109  --  113*  CO2 23  --   --   --  21*  GLUCOSE 116*  < > 142*  --  121*  BUN 8  < > 7  --  7  CREATININE 0.98  < > 0.70 0.82 0.76  CALCIUM 9.1  --   --   --  7.5*  < > = values in this interval not displayed.   PT/INR:   Recent Labs  07/02/16 1530  LABPROT 16.3*  INR 1.30    CBG (last 3)   Recent Labs  07/02/16 2338 07/03/16 0350  07/03/16 0403  GLUCAP 165* 123* 119*    ABG    Component Value Date/Time   PHART 7.436 07/03/2016 0434   PCO2ART 30.5 (L) 07/03/2016 0434   PO2ART 128.0 (H) 07/03/2016 0434   HCO3 20.5 07/03/2016 0434   TCO2 21 07/03/2016 0434   ACIDBASEDEF 3.0 (H) 07/03/2016 0434   O2SAT 99.0 07/03/2016 0434    CXR: Clear except for mild patchy opacity right lung c/w atelectasis  EKG: NSR w/out acute ischemic changes, 1st degree AV block and prolonged QT   Assessment/Plan: S/P Procedure(s) (LRB): MINIMALLY INVASIVE MITRAL VALVE REPAIR (MVR) (Right) TRANSESOPHAGEAL ECHOCARDIOGRAM (TEE) (N/A) CLIPPING OF ATRIAL APPENDAGE (N/A)  Doing well POD1 Reportedly slow to wake up from anesthesia, looks ready for extubation presently Maintaining NSR w/ stable hemodynamics, no drips O2 sats 97-100% on 40% FiO2 and CXR clear Expected post op acute blood loss anemia, Hgb 9.3 this morning Chronic diastolic CHF with expected post-op volume excess, weight 11 lbs > preop baseline   Acute vent wean and extubate  Mobilize post extubation  D/C Swan-Ganz and Aline  Diuresis  Leave chest tubes in place  Start coumadin  slowly   Rexene Alberts, MD 07/03/2016 7:38 AM

## 2016-07-03 NOTE — Anesthesia Postprocedure Evaluation (Signed)
Anesthesia Post Note  Patient: Nicole Norman  Procedure(s) Performed: Procedure(s) (LRB): MINIMALLY INVASIVE MITRAL VALVE REPAIR (MVR) (Right) TRANSESOPHAGEAL ECHOCARDIOGRAM (TEE) (N/A) CLIPPING OF ATRIAL APPENDAGE (N/A)  Patient location during evaluation: SICU Anesthesia Type: General Level of consciousness: oriented and patient cooperative Pain management: pain level controlled Vital Signs Assessment: post-procedure vital signs reviewed and stable Respiratory status: spontaneous breathing, nonlabored ventilation, respiratory function stable and patient connected to nasal cannula oxygen Cardiovascular status: blood pressure returned to baseline and stable Postop Assessment: no signs of nausea or vomiting Anesthetic complications: no    Last Vitals:  Vitals:   07/03/16 2015 07/03/16 2030  BP:    Pulse: 89 89  Resp: (!) 24 20  Temp:      Last Pain:  Vitals:   07/03/16 2000  TempSrc:   PainSc: 0-No pain                 Danese Dorsainvil,E. Antawan Mchugh

## 2016-07-04 ENCOUNTER — Inpatient Hospital Stay (HOSPITAL_COMMUNITY): Payer: PPO

## 2016-07-04 LAB — BASIC METABOLIC PANEL
Anion gap: 7 (ref 5–15)
BUN: 8 mg/dL (ref 6–20)
CHLORIDE: 106 mmol/L (ref 101–111)
CO2: 24 mmol/L (ref 22–32)
CREATININE: 0.79 mg/dL (ref 0.44–1.00)
Calcium: 8.2 mg/dL — ABNORMAL LOW (ref 8.9–10.3)
GFR calc Af Amer: >60 mL/min (ref >60)
GFR calc non Af Amer: >60 mL/min (ref >60)
Glucose, Bld: 116 mg/dL — ABNORMAL HIGH (ref 65–99)
Potassium: 4 mmol/L (ref 3.5–5.1)
Sodium: 137 mmol/L (ref 135–145)

## 2016-07-04 LAB — CBC
HCT: 26.5 % — ABNORMAL LOW (ref 36.0–46.0)
Hemoglobin: 8.6 g/dL — ABNORMAL LOW (ref 12.0–15.0)
MCH: 30.3 pg (ref 26.0–34.0)
MCHC: 32.5 g/dL (ref 30.0–36.0)
MCV: 93.3 fL (ref 78.0–100.0)
PLATELETS: 126 10*3/uL — AB (ref 150–400)
RBC: 2.84 MIL/uL — ABNORMAL LOW (ref 3.87–5.11)
RDW: 13.5 % (ref 11.5–15.5)
WBC: 16.6 10*3/uL — ABNORMAL HIGH (ref 4.0–10.5)

## 2016-07-04 LAB — PROTIME-INR
INR: 1.53
Prothrombin Time: 18.5 seconds — ABNORMAL HIGH (ref 11.4–15.2)

## 2016-07-04 LAB — GLUCOSE, CAPILLARY
GLUCOSE-CAPILLARY: 110 mg/dL — AB (ref 65–99)
Glucose-Capillary: 146 mg/dL — ABNORMAL HIGH (ref 65–99)

## 2016-07-04 MED ORDER — FUROSEMIDE 10 MG/ML IJ SOLN
20.0000 mg | Freq: Four times a day (QID) | INTRAMUSCULAR | Status: AC
Start: 2016-07-04 — End: 2016-07-04
  Administered 2016-07-04 (×3): 20 mg via INTRAVENOUS
  Filled 2016-07-04 (×3): qty 2

## 2016-07-04 MED ORDER — SODIUM CHLORIDE 0.9 % IV SOLN: 250.0000 mL | INTRAVENOUS | Status: DC | PRN

## 2016-07-04 MED ORDER — ASPIRIN EC 81 MG PO TBEC
81.0000 mg | DELAYED_RELEASE_TABLET | Freq: Every day | ORAL | Status: DC
  Administered 2016-07-04 – 2016-07-05 (×2): 81 mg via ORAL
  Filled 2016-07-04 (×2): qty 1

## 2016-07-04 MED ORDER — FUROSEMIDE 40 MG PO TABS: 40.0000 mg | ORAL_TABLET | Freq: Every day | ORAL | Status: DC

## 2016-07-04 MED ORDER — FUROSEMIDE 40 MG PO TABS
40.0000 mg | ORAL_TABLET | Freq: Two times a day (BID) | ORAL | Status: DC
  Administered 2016-07-05: 40 mg via ORAL
  Filled 2016-07-04: qty 1

## 2016-07-04 MED ORDER — SODIUM CHLORIDE 0.9% FLUSH: 3.0000 mL | INTRAVENOUS | Status: DC | PRN

## 2016-07-04 MED ORDER — ZOLPIDEM TARTRATE 5 MG PO TABS: 5.0000 mg | ORAL_TABLET | Freq: Every evening | ORAL | Status: DC | PRN

## 2016-07-04 MED ORDER — FA-PYRIDOXINE-CYANOCOBALAMIN 2.5-25-2 MG PO TABS
1.0000 | ORAL_TABLET | Freq: Every day | ORAL | Status: DC
  Administered 2016-07-04 – 2016-07-05 (×2): 1 via ORAL
  Filled 2016-07-04 (×2): qty 1

## 2016-07-04 MED ORDER — POTASSIUM CHLORIDE CRYS ER 20 MEQ PO TBCR
20.0000 meq | EXTENDED_RELEASE_TABLET | Freq: Two times a day (BID) | ORAL | Status: AC
  Administered 2016-07-04 – 2016-07-07 (×8): 20 meq via ORAL
  Filled 2016-07-04 (×8): qty 1

## 2016-07-04 MED ORDER — SODIUM CHLORIDE 0.9% FLUSH
3.0000 mL | Freq: Two times a day (BID) | INTRAVENOUS | Status: DC
  Administered 2016-07-04 – 2016-07-05 (×2): 3 mL via INTRAVENOUS

## 2016-07-04 MED ORDER — POLYSACCHARIDE IRON COMPLEX 150 MG PO CAPS
150.0000 mg | ORAL_CAPSULE | Freq: Every day | ORAL | Status: DC
  Administered 2016-07-05: 150 mg via ORAL
  Filled 2016-07-04: qty 1

## 2016-07-04 MED ORDER — POTASSIUM CHLORIDE CRYS ER 20 MEQ PO TBCR: 40.0000 meq | EXTENDED_RELEASE_TABLET | Freq: Once | ORAL | Status: DC

## 2016-07-04 MED ORDER — POTASSIUM CHLORIDE CRYS ER 20 MEQ PO TBCR: 20.0000 meq | EXTENDED_RELEASE_TABLET | Freq: Every day | ORAL | Status: DC

## 2016-07-04 MED ORDER — MOVING RIGHT ALONG BOOK
Freq: Once | Status: AC
  Administered 2016-07-04: 09:00:00
  Filled 2016-07-04 (×2): qty 1

## 2016-07-04 MED ORDER — ENOXAPARIN SODIUM 30 MG/0.3ML ~~LOC~~ SOLN
30.0000 mg | SUBCUTANEOUS | Status: DC
  Administered 2016-07-05: 30 mg via SUBCUTANEOUS
  Filled 2016-07-04: qty 0.3

## 2016-07-04 NOTE — Discharge Summary (Signed)
Physician Discharge Summary       [address]       [place] [address]             [phone]    Patient ID: Nicole Norman MRN: [id number] DOB/AGE: 02-07-1946 70 y.o.  Admit date: 07/02/2016 Discharge date: 07/09/2016  Admission Diagnoses: Severe mitral regurgitation  Active Diagnoses:  1. Pulmonary hypertension 2. COPD (chronic obstructive pulmonary disease) ([place]) 3. CHF (congestive heart failure) ([place]) 4. Incidental pulmonary nodule 5. Paroxysmal SVT (supraventricular tachycardia) ([facility]) 6. PVC's (premature ventricular contractions) 7. Tobacco abuse 8. Vertigo 9. History of pneumonia 10. ABL anemia 11. Mild thrombocytopenia 12. Post op a fib with RVR  Consults: None  Procedure (s):   Minimally-Invasive Mitral Valve Repair             Complex valvuloplasty including quadrangular resection of posterior leaflet             Folding plasty of posterior leaflet             Gore-tex neochord placement x6             Sorin Memo 3D Ring Annuloplasty (size 36mm, catalog #[id number], serial [id number])             Clipping of Left Atrial Appendage (Atricure left atrial clip, size 40 mm) by Dr. Roxy Manns on 07/02/2016.  History of Presenting Illness: Patient is a 70 year old female with history of mitral valve prolapse causing mitral regurgitation, palpitations with frequent PVCs, long-standing symptoms of exertional shortness of breath, and long-standing tobacco abuse with COPD who was transferred from [facility] for evaluation of severe mitral regurgitation. The patient states that she first began to experience symptoms of exertional shortness of breath and palpitations nearly 5 years ago. She was noted to have mitral valve prolapse and initially was evaluated by Dr. Ubaldo Glassing. Symptoms progressed and she was referred to Dr. Heath Lark has been following her since December 2015. At that time the patient complained of significant exertional  shortness of breath and frequent palpitations. Echocardiogram performed at that time revealed normal left ventricular systolic function with mitral valve prolapse and "moderate to severe"mitral regurgitation. A nuclear stress test was performed and felt to be low risk for myocardial ischemia. She was started on metoprolol and symptoms of palpitations improved. The patient has been followed intermittently ever since by Dr. Fletcher Anon. A Holter monitor was performed documenting frequent PVCs. The patient states that over the past year she has developed further progression of symptoms of exertional shortness of breath and fatigue with increasing episodes of palpitations. She was seen in follow-up recently by Dr. Fletcher Anon and a follow-up echocardiogram was performed 06/05/2016. This revealed normal left ventricular size and systolic function with posterior leaflet prolapse and "moderate eccentric regurgitation". Arrangements for were made for a repeat Holter monitor because of worsening symptoms of palpitations.   The patient states that symptoms of shortness of breath continued to progress to the point where she began to experience trouble breathing even at rest. She presented acutely to the emergency department at [facility] on 06/14/2016 with resting shortness of breath withbaseline oxygen saturation was 88% on room air. She also complained of cough and baseline white blood count was elevated at 20,400. The patient was admitted to the hospital and placed on BiPAP with symptomatic improvement. CT scan of the chest was performed to rule out pulmonary embolism but was notable for the presence of interstitial edema and groundglass opacities suspicious for possible  pneumonia. She was treated with intravenous antibiotics, steroids and nebulizersfor presumed community acquired pneumonia and bronchitis. Following admission she had a brief episode of supraventricular tachycardia which converted to sinus rhythm following  adenosine administration. Cardiology consultation was obtained and the patient was started on diuretic therapy. The patient gradually improved over the next several days. She underwent transesophageal echocardiogram 06/18/2016 confirmed the presence of mitral valve prolapse with flail segment of posterior leaflet and severe mitral regurgitation. Left and right heart catheterization was performed 06/19/2016 and notable for the absence of significant coronary artery disease with severe mitral regurgitation and mildly elevated pulmonary artery pressures. The patient was transferred to [facility] for further management.    She was originally seen in consultation on 06/20/2016 during her recent hospitalization. At that time she was recovering from acute pneumonia that was complicated by acute exacerbation of chronic diastolic congestive heart failure. She now returns to our office for follow-up with tentative plans to proceed with elective mitral valve repair later this week. She states that she has done well since hospital discharge. Specifically, she has not had problems with shortness of breath. She states that her breathing is essentially back to her baseline and how it has been 4 the last couple of years. She has not had fevers, chills, or productive cough. She has not had palpitations or dizzy spells. She has not been active physically. Appetite has been marginal.  The patient is married and lives with her husband in [place], [place].She works full-time as a Statistician. She has a long-standing history of tobacco abuse smoking greater than 1 pack of cigarettes daily for many years, more recently smoking half pack of cigarettes daily. She describes a several year history of progressive symptoms of exertional shortness of breath and fatigue. Symptoms have reportedly got much worse over the past year. Onset of symptoms has been slow and gradual without any recent acute changes. Prior to  hospital admission she reports symptoms of orthopnea with frequent palpitations. She has not had lower extremity edema. She gets occasional episodes of tightness across her chest typically in the setting of more severe shortness of breath and palpitations. She has not had dizzy spells or syncope. Since hospital admission nearly 1 week ago the patient's breathing has improved dramatically and she now reports feeling comfortable breathing on room air. She denies any fevers, chills, or productive cough. Appetite is fair. Remainder for review of systems is noncontributory.  The patient and her husband werecounseled at length regarding the indications, risks and potential benefits of mitral valve repair. The rationale for elective surgery has been explained, including a comparison between surgery and continued medical therapy with close follow-up. The likelihood of successful and durable valve repair has been discussed with particular reference to the findings of their recent echocardiogram. Based upon these findings and previous experience, I have quoted them a greater than 95percent likelihood of successful valve repair. In the unlikely event that their valve cannot be successfully repaired, we discussed the possibility of replacing the mitral valve using a mechanical prosthesis with the attendant need for long-term anticoagulation versus the alternative of replacing it using a bioprosthetic tissue valve with its potential for late structural valve deterioration and failure, depending upon the patient's longevity. The patient specifically requests that if the mitral valve must be replaced that it be done using a mechanicalvalve. The patient understands and accepts all potential risks of surgery. Pre operative carotid duplex showed no significant internal carotid artery stenosis. She underwent  a minimally invasive mitral valve repair on 07/02/2016.   Brief Hospital Course:  The patient was extubated the  morning of post op day one without difficulty. She remained afebrile and hemodynamically stable. Gordy Councilman, a line, and foley were removed early in the post operative course. Lopressor was held  as she was AAI paced and had underlying bradycardia.  She was started on Coumadin. Her PT and INR were monitored daily. Her INR became supra therapeutic so Coumadin was held for 2 days. She then was started on Coumadin 2 mg. Her last PT and INR were 1.98. She was volume over loaded and diuresed. She had ABL anemia. She did not require a post op transfusion. Her last H and H was 10.7 and 32.5. She was weaned off the insulin drip. The patient's HGA1C pre op was 5.5 . The patient was felt surgically stable for transfer from the ICU to [facility] for further convalescence on 07/05/2016. She continues to progress with cardiac rehab. She was ambulating on room air. She has been tolerating a diet and has had a bowel movement. Epicardial pacing wires were removed on 07/07/2016. She went into atrial fibrillation with RVR the morning of 12/05. She was put on Amiodarone drip and low dose Lopressor. She converted to sinus rhythm and was put on oral Amiodarone. Chest tube sutures will be removed in the office after discharge. The patient is felt surgically stable for discharge today.    Latest Vital Signs: Blood pressure 106/61, pulse 85, temperature 98.4 F (36.9 C), temperature source Oral, resp. rate 18, height 4' 11.5" (1.511 m), weight 113 lb 8 oz (51.5 kg), SpO2 98 %.  Physical Exam: Cardiovascular:RRR Pulmonary: Mostly clear Abdomen: Soft, non tender, bowel sounds present. Extremities: No lower extremity edema. Wounds:Clean and dry. Scant drainage from chest tube sites.  Discharge Condition: Stable and discharged to home.  Recent laboratory studies:  Lab Results  Component Value Date   WBC 11.5 (H) 07/09/2016   HGB 10.7 (L) 07/09/2016   HCT 32.5 (L) 07/09/2016   MCV 90.5 07/09/2016   PLT 210 07/09/2016   Lab  Results  Component Value Date   NA 137 07/09/2016   K 3.5 07/09/2016   CL 103 07/09/2016   CO2 25 07/09/2016   CREATININE 0.79 07/09/2016   GLUCOSE 110 (H) 07/09/2016    Diagnostic Studies:   EXAM: CHEST  2 VIEW  COMPARISON:  July 06, 2016  FINDINGS: The heart size and mediastinal contours are within normal limits. There are bilateral pleural effusions not significantly changed. There is no pulmonary edema or focal pneumonia. The previously noted right chest tube has been removed. There is no pneumothorax. The visualized skeletal structures are stable.  IMPRESSION: Small bilateral pleural effusions.  No pneumothorax.   Electronically Signed   By: Abelardo Diesel M.D.   On: 07/08/2016 08:25  Ct Angio Chest Pe W And/or Wo Contrast  Result Date: 06/14/2016 CLINICAL DATA:  Respiratory distress.  D-dimer 987 EXAM: CT ANGIOGRAPHY CHEST WITH CONTRAST TECHNIQUE: Multidetector CT imaging of the chest was performed using the standard protocol during bolus administration of intravenous contrast. Multiplanar CT image reconstructions and MIPs were obtained to evaluate the vascular anatomy. CONTRAST:  75 mL Isovue 370 COMPARISON:  11/02/2007 FINDINGS: Cardiovascular: Satisfactory opacification of the pulmonary arteries to the segmental level. No evidence of pulmonary embolism. Normal heart size. No pericardial effusion. Aortic atherosclerosis. Mediastinum/Nodes: No enlarged mediastinal, hilar, or axillary lymph nodes. Thyroid gland, trachea, and esophagus demonstrate no significant findings. Lungs/Pleura:  Small bilateral pleural effusions with basilar atelectasis. Interstitial prominence in the lung bases and lung periphery consistent with interstitial edema. Multiple focal nodular ground-glass infiltrates demonstrated in both lungs, likely representing inflammatory process. Largest is in the right upper lung and measures about 12 mm diameter. Airways are patent. No pneumothorax. Upper  Abdomen: Small cyst in the lateral segment left lobe of the liver are unchanged since previous study. Surgical absence of the gallbladder. Musculoskeletal: No chest wall abnormality. No acute or significant osseous findings. Review of the MIP images confirms the above findings. IMPRESSION: No evidence of significant pulmonary embolus. Small bilateral pleural effusions with basilar atelectasis and diffuse bilateral peripheral and basilar interstitial edema. **An incidental finding of potential clinical significance has been found. Multiple focal nodular ground-glass opacities in both lungs are nonspecific though probably represent inflammatory or infectious process. Non-contrast chest CT at 3-6 months is recommended. If nodules persist, subsequent management will be based upon the most suspicious nodule(s). This recommendation follows the consensus statement: Guidelines for Management of Incidental Pulmonary Nodules Detected on CT Images: From the Fleischner Society 2017; Radiology 2017; 284:228-243. ** Electronically Signed   By: Lucienne Capers M.D.   On: 06/14/2016 01:07   Ct Angio Abd/pel W/ And/or W/o  Result Date: 06/20/2016 CLINICAL DATA:  Aortoiliac obstruction, hypertension EXAM: CTA ABDOMEN AND PELVIS WITH CONTRAST TECHNIQUE: Multidetector CT imaging of the abdomen and pelvis was performed using the standard protocol during bolus administration of intravenous contrast. Multiplanar reconstructed images and MIPs were obtained and reviewed to evaluate the vascular anatomy. CONTRAST:  100 mL Isovue 370 IV COMPARISON:  CT 07/15/2013 FINDINGS: VASCULAR Aorta: Mild atheromatous irregularity with calcified plaque predominately in the infrarenal segment. Some eccentric nonocclusive mural thrombus in the infrarenal segment. No aneurysm, dissection, or stenosis. Celiac: Mild short-segment narrowing at the level of the median arcuate ligament of the diaphragm, patent distally. SMA: Patent without evidence of  aneurysm, dissection, vasculitis or significant stenosis. Renals: Duplicated right renal arteries, superior dominant. Bilateral renal arteries are patent without evidence of aneurysm, dissection, vasculitis, fibromuscular dysplasia or significant stenosis. IMA: Patent without evidence of aneurysm, dissection, vasculitis or significant stenosis. Inflow: Nonocclusive calcified plaque throughout the common iliac arteries. Left common iliac ectatic up to 13 mm diameter, right 11 mm. No dissection or stenosis. External iliac arteries unremarkable. Proximal Outflow: Bilateral common femoral and visualized portions of the superficial and profunda femoral arteries are patent without evidence of aneurysm, dissection, vasculitis or significant stenosis. Veins: No obvious venous abnormality within the limitations of this arterial phase study. Review of the MIP images confirms the above findings. NON-VASCULAR Lower chest: Trace bilateral pleural effusions right greater than left. Dependent subsegmental atelectasis posteriorly in the visualized lower lobes. Hepatobiliary: 18 mm left hepatic cyst as before. No new hepatic lesion. Interval cholecystectomy. Pancreas: Unremarkable. No pancreatic ductal dilatation or surrounding inflammatory changes. Spleen: Normal in size without focal abnormality. Adrenals/Urinary Tract: Adrenal glands are unremarkable. Kidneys are normal, without renal calculi, focal lesion, or hydronephrosis. Bladder is unremarkable. Stomach/Bowel: Stomach is within normal limits. Appendix appears normal. No evidence of bowel wall thickening, distention, or inflammatory changes. Lymphatic: No adenopathy localized. Reproductive: Uterus and bilateral adnexa are unremarkable. Other: No ascites.  No free air. Musculoskeletal: No acute or significant osseous findings. IMPRESSION: VASCULAR 1. No acute findings. 2. Iliac and Aortic Atherosclerosis (ICD10-170.0) without significant stenosis or occlusion. 3. 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 NON-VASCULAR 1. Trace pleural effusions right greater than left.  2. Benign left hepatic cyst. Electronically Signed   By: Lucrezia Europe M.D.   On: 06/20/2016 14:23     Discharge Medications:   Medication List    STOP taking these medications   diltiazem 120 MG 24 hr capsule Commonly known as:  CARDIZEM CD     TAKE these medications   acetaminophen 325 MG tablet Commonly known as:  TYLENOL Take 2 tablets (650 mg total) by mouth every 6 (six) hours as needed for mild pain (or Fever >/= 101).   amiodarone 200 MG tablet Commonly known as:  PACERONE Take 1 tablet (200 mg total) by mouth 2 (two) times daily. For 10 days;then take Amiodarone 200 mg by mouth daily thereafter What changed:  additional instructions   aspirin 81 MG tablet Take 81 mg by mouth daily. AM   calcium-vitamin D 500-200 MG-UNIT tablet Take 1 tablet by mouth daily. 10 AM   furosemide 40 MG tablet Commonly known as:  LASIX Take 1 tablet (40 mg total) by mouth daily.   metoprolol tartrate 25 MG tablet Commonly known as:  LOPRESSOR Take 0.5 tablets (12.5 mg total) by mouth 2 (two) times daily.   potassium chloride SA 20 MEQ tablet Commonly known as:  K-DUR,KLOR-CON Take 1 tablet (20 mEq total) by mouth daily.   traMADol 50 MG tablet Commonly known as:  ULTRAM Take 50 mg by mouth every 4-6 hours PRN moderate or severe pain.   Vitamin D3 2000 units Tabs Take 1 tablet by mouth daily. 10 AM   warfarin 2 MG tablet Commonly known as:  COUMADIN Take 1 tablet (2 mg total) by mouth daily at 6 PM.      The patient has been discharged on:   1.Beta Blocker:  Yes [  x ]                              No   [   ]                              If No, reason:  2.Ace Inhibitor/ARB: Yes [   ]                                     No  [ x   ]                                     If No, reason:Labile BP  3.Statin:   Yes [   ]                  No  [   x ]                  If No, reason:No CAD  4.Shela [place]:  Yes  [  x ]                  No   [   ]                  If No, reason:  Follow Up Appointments: Follow-up Information    Rexene Alberts, MD Follow up.   Specialty:  Cardiothoracic Surgery Why:  PA/LAT CXR to be taken (at  [facility] which is in the same building as Dr. Guy Sandifer office) on at; Appointment time is at  Contact information: [place] [address] [phone]        Murray Hodgkins, NP Follow up on 07/22/2016.   Specialties:  Nurse Practitioner, Cardiology, Radiology Why:  Appointment time is at 2:30 pm Contact information: [place] [address] [phone]        [place] Office Follow up.   Specialty:  Cardiology Why:  Call to have PT and INR (on Coumadin, INR 2-2.5) drawn on Friday 07/11/2016 Contact information: [address] [place] [phone]       Nurse Follow up on 07/22/2016.   Why:  Office will call or mail appointment time. Appointment is for chest tube suture removal only. Contact information: [place] [address]          Signed: Cinda Quest 07/09/2016, 11:25 AM

## 2016-07-04 NOTE — Progress Notes (Addendum)
[address]       [place] [address]             [phone]        CARDIOTHORACIC SURGERY PROGRESS NOTE   R2 Days Post-Op Procedure(s) (LRB): MINIMALLY INVASIVE MITRAL VALVE REPAIR (MVR) (Right) TRANSESOPHAGEAL ECHOCARDIOGRAM (TEE) (N/A) CLIPPING OF ATRIAL APPENDAGE (N/A)  Subjective: Looks good and feels well.  Only complaint is "scratchy throat".  No pain or SOB.  Appetite improving.  Ambulated 3 times around SICU this morning  Objective: Vital signs: BP Readings from Last 1 Encounters:  07/04/16 (!) 93/48   Pulse Readings from Last 1 Encounters:  07/04/16 89   Resp Readings from Last 1 Encounters:  07/04/16 (!) 22   Temp Readings from Last 1 Encounters:  07/04/16 98.5 F (36.9 C) (Oral)    Hemodynamics:    Physical Exam:  Rhythm:   Sinus w/ 3rd degree AV block, DDD pacing  Breath sounds: Few rhonchi  Heart sounds:  RRR w/out murmur  Incisions:  Dressings dry, intact  Abdomen:  Soft, non-distended, non-tender  Extremities:  Warm, well-perfused  Chest tubes:  decreasing volume thin serosanguinous output, no air leak    Intake/Output from previous day: 11/30 0701 - 12/01 0700 In: 1098 [P.O.:480; I.V.:3; IV Piggyback:615] Out: 3440 [Urine:2960; Chest Tube:480] Intake/Output this shift: No intake/output data recorded.  Lab Results:  CBC: Recent Labs  07/03/16 1625 07/04/16 0348  WBC 17.6* 16.6*  HGB 9.6* 8.6*  HCT 29.3* 26.5*  PLT 161 126*    BMET:  Recent Labs  07/03/16 0353 07/03/16 1623 07/03/16 1625 07/04/16 0348  NA 139 139  --  137  K 3.7 3.7  --  4.0  CL 113* 102  --  106  CO2 21*  --   --  24  GLUCOSE 121* 108*  --  116*  BUN 7 7  --  8  CREATININE 0.76 0.80 0.84 0.79  CALCIUM 7.5*  --   --  8.2*     PT/INR:   Recent Labs  07/04/16 0348  LABPROT 18.5*  INR 1.53    CBG (last 3)   Recent Labs  07/03/16 2348 07/04/16 0404 07/04/16 0844  GLUCAP 98 110* 146*    ABG    Component Value  Date/Time   PHART 7.440 07/03/2016 0915   PCO2ART 33.6 07/03/2016 0915   PO2ART 80.0 (L) 07/03/2016 0915   HCO3 22.8 07/03/2016 0915   TCO2 23 07/03/2016 1623   ACIDBASEDEF 1.0 07/03/2016 0915   O2SAT 96.0 07/03/2016 0915    CXR: PORTABLE CHEST 1 VIEW  COMPARISON:  July 03, 2016  FINDINGS: Endotracheal tube and nasogastric tube have been removed. Swan-Ganz catheter is been removed. Cordis tip is in the left innominate vein. Temporary pacemaker wires are attached to the right heart. Chest tube on the right has its tip in the right apex region. There is no appreciable pneumothorax.  There is persistent consolidation in the right lower lobe with small right pleural effusion. There is a left pleural effusion with atelectasis in the left base. Heart is upper normal in size with pulmonary vascularity within normal limits. There is a is prosthetic mitral valve present as well as a left atrial appendage clamp. There is aortic atherosclerosis. No evident adenopathy.  IMPRESSION: Tube and catheter positions as described without evident pneumothorax. Consolidation right lower lung zone region, stable. Left base atelectasis. Bilateral pleural effusions. Stable cardiac silhouette. There is aortic atherosclerosis. A comparison  with 1 day prior, left pleural effusion may be slightly larger.   Electronically Signed   By: Lowella Grip III M.D.   On: 07/04/2016 08:08  Assessment/Plan: S/P Procedure(s) (LRB): MINIMALLY INVASIVE MITRAL VALVE REPAIR (MVR) (Right) TRANSESOPHAGEAL ECHOCARDIOGRAM (TEE) (N/A) CLIPPING OF ATRIAL APPENDAGE (N/A)  Overall doing well POD2 although now in CHB, requiring DDD pacing - was in NSR yesterday morning Stable BP Breathing comfortably w/ O2 sats 93-95% on RA Chronic diastolic CHF with expected post-op volume excess, I/O's negative 2.3 liters yesterday and weight down 3 lbs but still 8 lbs > preop baseline Expected post op acute blood loss  anemia, Hgb down 8.6 this morning Expected post op atelectasis, mild Post op thrombocytopenia, platelet count down 126k this morning Leukocytosis w/out fever, likely reactive, WBC down to 16.6k today Longstanding tobacco abuse and COPD Recent h/o community acquired pneumonia    Continue DDD pacing and hold beta blockers, amiodarone  Keep in ICU until underlying rhythm improves  Leave chest tubes in place until output decreased further  Mobilize  Diuresis  Watch anemia  Coumadin  Low dose lovenox for DVT prophylaxis - watch platelet count   Rexene Alberts, MD 07/04/2016 9:11 AM

## 2016-07-04 NOTE — Progress Notes (Signed)
      [address]       [place] [address]             [phone]      POD # 2 mitral repair  BP (!) 95/49   Pulse 81   Temp 98.5 F (36.9 C) (Oral)   Resp (!) 26   Ht 4' 11.5" (1.511 m)   Wt 129 lb 9.6 oz (58.8 kg)   SpO2 94%   BMI 25.74 kg/m   Intake/Output Summary (Last 24 hours) at 07/04/16 1658 Last data filed at 07/04/16 1200  Gross per 24 hour  Intake              855 ml  Output             1410 ml  Net             -555 ml   CBG Ok, no PM labs  Currently in SR 85, pacer changed to VVI at [place]. Roxan Hockey, MD [facility] [phone]

## 2016-07-04 NOTE — Discharge Instructions (Addendum)
Mitral Valve Repair, Care After °This sheet gives you information about how to care for yourself after your procedure. Your health care provider may also give you more specific instructions. If you have problems or questions, contact your health care provider. °What can I expect after the procedure? °After the procedure, it is common to have: °· Pain at the incision area that may last for several weeks. °Follow these instructions at home: °Incision care  °· Follow instructions from your health care provider about how to take care of your incision. Make sure you: °¨ Wash your hands with soap and water before you change your bandage (dressing). If soap and water are not available, use hand sanitizer. °¨ Change your dressing as told by your health care provider. °¨ Leave stitches (sutures), skin glue, or adhesive strips in place. These skin closures may need to stay in place for 2 weeks or longer. If adhesive strip edges start to loosen and curl up, you may trim the loose edges. Do not remove adhesive strips completely unless your health care provider tells you to do that. °· Check your incision area every day for signs of infection. Check for: °¨ More redness, swelling, or pain. °¨ More fluid or blood. °¨ Warmth. °¨ Pus or a bad smell. °·  Do not apply powder or lotion to the area. °Driving  °· Do not drive until your health care provider approves. °· Do not drive or use heavy machinery while taking prescription pain medicines. °Bathing  °· Do not take baths, swim, or use a hot tub for 2-4 weeks after surgery, or until your health care provider approves. Ask your health care provider if you may take showers. °· To wash the incision site, gently wash with soap and water and pat the area dry with a clean towel. Do not rub the incision area. That may cause bleeding. °Activity  °· Rest as told by your health care provider. Ask your health care provider when you can resume normal activities, including sexual  activity. °· Avoid the following activities for 6-8 weeks, or as long as directed: °¨ Lifting anything that is heavier than 10 lb (4.5 kg), or the limit that your health care provider tells you. °¨ Pushing or pulling things with your arms. °· Avoid climbing stairs and using the handrail to pull yourself up for the first 2-3 weeks after surgery. °· Avoid airplane travel for 4-6 weeks, or as long as directed. °· Avoid sitting for long periods of time and crossing your legs. Get up and move around at least once every 1-2 hours. °· If you are taking blood thinners (anticoagulants), avoid activities that have a high risk of injury. Ask your health care provider what activities are safe for you. °Lifestyle  °· Limit alcohol intake to no more than 1 drink a day for nonpregnant women and 2 drinks a day for men. One drink equals 12 oz of beer, 5 oz of wine, or 1½ oz of hard liquor. °· Do not use any products that contain nicotine or tobacco, such as cigarettes and e-cigarettes. If you need help quitting, ask your health care provider. °General instructions  °· Take your temperature every day and weigh yourself every morning for the first 7 days after surgery. Write your temperatures and weight down and take this record with you to any follow-up visits. °· Take over-the-counter and prescription medicines only as told by your health care provider. °· To prevent or treat constipation while you are taking prescription   pain medicine, your health care provider may recommend that you: °¨ Drink enough fluid to keep your urine clear or pale yellow. °¨ Take over-the-counter or prescription medicines. °¨ Eat foods that are high in fiber, such as fresh fruits and vegetables, whole grains, and beans. °¨ Limit foods that are high in fat and processed sugars, such as fried and sweet foods. °· Follow instructions from your health care provider about eating or drinking restrictions. °· Wear compression stockings for at least 2 weeks, or as  long as told by your health care provider. These stockings help to prevent blood clots and reduce swelling in your legs. If your ankles are swollen after 2 weeks, continue to wear the stockings. °· Keep all follow-up visits as told by your health care provider. This is important. °Contact a health care provider if: °· You develop a skin rash. °· Your weight is increasing each day over 2-3 days. °· You gain 2 lb (1 kg) or more in a single day. °· You have a fever. °Get help right away if: °· You develop chest pain that feels different from the pain caused by your incision. °· You develop shortness of breath or difficulty breathing. °· You have more redness, swelling, or pain around your incision. °· You have more fluid or blood coming from your incision. °· Your incision feels warm to the touch. °· You have pus or a bad smell coming from your incision. °· You feel light-headed. °This information is not intended to replace advice given to you by your health care provider. Make sure you discuss any questions you have with your health care provider. °Document Released: 02/07/2005 Document Revised: 05/02/2016 Document Reviewed: 05/02/2016 °Elsevier Interactive Patient Education © 2017 [employer] ° °---------------------------------------------------------------------------------------------------------------------------------------------------------------------------------------- ° ° °Information on my medicine - Coumadin®   (Warfarin) ° °This medication education was reviewed with me or my healthcare representative as part of my discharge preparation.   ° °Why was Coumadin prescribed for you? °Coumadin was prescribed for you because you have a blood clot or a medical condition that can cause an increased risk of forming blood clots. Blood clots can cause serious health problems by blocking the flow of blood to the heart, lung, or brain. Coumadin can prevent harmful blood clots from forming. °As a reminder your  indication for Coumadin is:   Blood Clot Prevention After Heart Valve Surgery ° °What test will check on my response to Coumadin? °While on Coumadin (warfarin) you will need to have an INR test regularly to ensure that your dose is keeping you in the desired range. The INR (international normalized ratio) number is calculated from the result of the laboratory test called prothrombin time (PT). ° °If an INR APPOINTMENT HAS NOT ALREADY BEEN MADE FOR YOU please schedule an appointment to have this lab work done by your health care provider within 7 days. °Your INR goal is usually a number between:  2 to 3 or your provider may give you a more narrow range like 2-2.5.  Ask your health care provider during an office visit what your goal INR is. ° °What  do you need to  know  About  COUMADIN? °Take Coumadin (warfarin) exactly as prescribed by your healthcare provider about the same time each day.  DO NOT stop taking without talking to the doctor who prescribed the medication.  Stopping without other blood clot prevention medication to take the place of Coumadin may increase your risk of developing a new clot or stroke.    Get refills before you run out. ° °What do you do if you miss a dose? °If you miss a dose, take it as soon as you remember on the same day then continue your regularly scheduled regimen the next day.  Do not take two doses of Coumadin at the same time. ° °Important Safety Information °A possible side effect of Coumadin (Warfarin) is an increased risk of bleeding. You should call your healthcare provider right away if you experience any of the following: °? Bleeding from an injury or your nose that does not stop. °? Unusual colored urine (red or dark brown) or unusual colored stools (red or black). °? Unusual bruising for unknown reasons. °? A serious fall or if you hit your head (even if there is no bleeding). ° °Some foods or medicines interact with Coumadin® (warfarin) and might alter your response to  warfarin. To help avoid this: °? Eat a balanced diet, maintaining a consistent amount of Vitamin K. °? Notify your provider about major diet changes you plan to make. °? Avoid alcohol or limit your intake to 1 drink for women and 2 drinks for men per day. °(1 drink is 5 oz. wine, 12 oz. beer, or 1.5 oz. liquor.) ° °Make sure that ANY health care provider who prescribes medication for you knows that you are taking Coumadin (warfarin).  Also make sure the healthcare provider who is monitoring your Coumadin knows when you have started a new medication including herbals and non-prescription products. ° °Coumadin® (Warfarin)  Major Drug Interactions  °Increased Warfarin Effect Decreased Warfarin Effect  °Alcohol (large quantities) °Antibiotics (esp. Septra/Bactrim, Flagyl, Cipro) °Amiodarone (Cordarone) °Aspirin (ASA) °Cimetidine (Tagamet) °Megestrol (Megace) °NSAIDs (ibuprofen, naproxen, etc.) °Piroxicam (Feldene) °Propafenone (Rythmol SR) °Propranolol (Inderal) °Isoniazid (INH) °Posaconazole (Noxafil) Barbiturates (Phenobarbital) °Carbamazepine (Tegretol) °Chlordiazepoxide (Librium) °Cholestyramine (Questran) °Griseofulvin °Oral Contraceptives °Rifampin °Sucralfate (Carafate) °Vitamin K  ° °Coumadin® (Warfarin) Major Herbal Interactions  °Increased Warfarin Effect Decreased Warfarin Effect  °Garlic °Ginseng °Ginkgo biloba Coenzyme Q10 °Green tea °St. John’s wort   ° °Coumadin® (Warfarin) FOOD Interactions  °Eat a consistent number of servings per week of foods HIGH in Vitamin K °(1 serving = ½ cup)  °Collards (cooked, or boiled & drained) °Kale (cooked, or boiled & drained) °Mustard greens (cooked, or boiled & drained) °Parsley *serving size only = ¼ cup °Spinach (cooked, or boiled & drained) °Swiss chard (cooked, or boiled & drained) °Turnip greens (cooked, or boiled & drained)  °Eat a consistent number of servings per week of foods MEDIUM-HIGH in Vitamin K °(1 serving = 1 cup)  °Asparagus (cooked, or boiled &  drained) °Broccoli (cooked, boiled & drained, or raw & chopped) °Brussel sprouts (cooked, or boiled & drained) *serving size only = ½ cup °Lettuce, raw (green leaf, endive, romaine) °Spinach, raw °Turnip greens, raw & chopped  ° °These websites have more information on Coumadin (warfarin):  www.coumadin.com; °www.ahrq.gov/consumer/coumadin.htm; ° ° ° °

## 2016-07-05 ENCOUNTER — Inpatient Hospital Stay (HOSPITAL_COMMUNITY): Payer: PPO

## 2016-07-05 LAB — CBC
HCT: 28.3 % — ABNORMAL LOW (ref 36.0–46.0)
HEMOGLOBIN: 9.4 g/dL — AB (ref 12.0–15.0)
MCH: 30.8 pg (ref 26.0–34.0)
MCHC: 33.2 g/dL (ref 30.0–36.0)
MCV: 92.8 fL (ref 78.0–100.0)
Platelets: 123 10*3/uL — ABNORMAL LOW (ref 150–400)
RBC: 3.05 MIL/uL — ABNORMAL LOW (ref 3.87–5.11)
RDW: 13.5 % (ref 11.5–15.5)
WBC: 14.7 10*3/uL — ABNORMAL HIGH (ref 4.0–10.5)

## 2016-07-05 LAB — BASIC METABOLIC PANEL
Anion gap: 6 (ref 5–15)
BUN: 11 mg/dL (ref 6–20)
CALCIUM: 8.4 mg/dL — AB (ref 8.9–10.3)
CHLORIDE: 107 mmol/L (ref 101–111)
CO2: 26 mmol/L (ref 22–32)
CREATININE: 0.78 mg/dL (ref 0.44–1.00)
GFR calc non Af Amer: >60 mL/min (ref >60)
Glucose, Bld: 88 mg/dL (ref 65–99)
Potassium: 4.1 mmol/L (ref 3.5–5.1)
SODIUM: 139 mmol/L (ref 135–145)

## 2016-07-05 LAB — PROTIME-INR
INR: 2.9
PROTHROMBIN TIME: 30.9 s — AB (ref 11.4–15.2)

## 2016-07-05 MED ORDER — MOVING RIGHT ALONG BOOK
Freq: Once | Status: DC
  Filled 2016-07-05: qty 1

## 2016-07-05 MED ORDER — ALUM & MAG HYDROXIDE-SIMETH 200-200-20 MG/5ML PO SUSP: 15.0000 mL | ORAL | Status: DC | PRN

## 2016-07-05 MED ORDER — SODIUM CHLORIDE 0.9% FLUSH: 3.0000 mL | INTRAVENOUS | Status: DC | PRN

## 2016-07-05 MED ORDER — SODIUM CHLORIDE 0.9 % IV SOLN: 250.0000 mL | INTRAVENOUS | Status: DC | PRN

## 2016-07-05 MED ORDER — SODIUM CHLORIDE 0.9% FLUSH
3.0000 mL | Freq: Two times a day (BID) | INTRAVENOUS | Status: DC
  Administered 2016-07-05 – 2016-07-07 (×4): 3 mL via INTRAVENOUS

## 2016-07-05 MED ORDER — MAGNESIUM HYDROXIDE 400 MG/5ML PO SUSP: 30.0000 mL | Freq: Every day | ORAL | Status: DC | PRN

## 2016-07-05 MED ORDER — FUROSEMIDE 40 MG PO TABS
40.0000 mg | ORAL_TABLET | Freq: Every day | ORAL | Status: DC
  Administered 2016-07-06 – 2016-07-08 (×3): 40 mg via ORAL
  Filled 2016-07-05 (×3): qty 1

## 2016-07-05 NOTE — Progress Notes (Signed)
3 Days Post-Op Procedure(s) (LRB): MINIMALLY INVASIVE MITRAL VALVE REPAIR (MVR) (Right) TRANSESOPHAGEAL ECHOCARDIOGRAM (TEE) (N/A) CLIPPING OF ATRIAL APPENDAGE (N/A) Subjective: Denies pain "food is horrible"  Objective: Vital signs in last 24 hours: Temp:  [97.5 F (36.4 C)-98.7 F (37.1 C)] 97.5 F (36.4 C) (12/02 0724) Pulse Rate:  [69-89] 85 (12/02 0700) Cardiac Rhythm: Normal sinus rhythm (12/02 0729) Resp:  [17-30] 24 (12/02 0700) BP: (81-123)/(43-64) 107/60 (12/02 0700) SpO2:  [93 %-99 %] 96 % (12/02 0700) Weight:  [123 lb 10.9 oz (56.1 kg)] 123 lb 10.9 oz (56.1 kg) (12/02 0500)  Hemodynamic parameters for last 24 hours:    Intake/Output from previous day: 12/01 0701 - 12/02 0700 In: 500 [P.O.:450; IV Piggyback:50] Out: [id number] [Urine:3120; Chest Tube:370] Intake/Output this shift: No intake/output data recorded.  General appearance: alert, cooperative and no distress Neurologic: intact Heart: regular rate and rhythm Lungs: diminished breath sounds bibasilar  Lab Results:  Recent Labs  07/04/16 0348 07/05/16 0517  WBC 16.6* 14.7*  HGB 8.6* 9.4*  HCT 26.5* 28.3*  PLT 126* 123*   BMET:  Recent Labs  07/04/16 0348 07/05/16 0517  NA 137 139  K 4.0 4.1  CL 106 107  CO2 24 26  GLUCOSE 116* 88  BUN 8 11  CREATININE 0.79 0.78  CALCIUM 8.2* 8.4*    PT/INR:  Recent Labs  07/05/16 0517  LABPROT 30.9*  INR 2.90   ABG    Component Value Date/Time   PHART 7.440 07/03/2016 0915   HCO3 22.8 07/03/2016 0915   TCO2 23 07/03/2016 1623   ACIDBASEDEF 1.0 07/03/2016 0915   O2SAT 96.0 07/03/2016 0915   CBG (last 3)   Recent Labs  07/03/16 2348 07/04/16 0404 07/04/16 0844  GLUCAP 98 110* 146*    Assessment/Plan: S/P Procedure(s) (LRB): MINIMALLY INVASIVE MITRAL VALVE REPAIR (MVR) (Right) TRANSESOPHAGEAL ECHOCARDIOGRAM (TEE) (N/A) CLIPPING OF ATRIAL APPENDAGE (N/A) -  CV- s/p mitral repair- in junctional rhythm this morning- continue to hold  metoprolol, amiodarone  INR up to 2.9! Hold coumadin, recheck in AM  RESP- continue IS  CT drainage 370 ml last 24 hours- keep CT in place  RENAL- creatinine and lytes OK  ENDO- CBG Well controlled  Anemia- mild, follow  Continue cardiac rehab  Transfer to [place]   LOS: 3 days    Melrose Nakayama 07/05/2016

## 2016-07-06 ENCOUNTER — Inpatient Hospital Stay (HOSPITAL_COMMUNITY): Payer: PPO

## 2016-07-06 LAB — BASIC METABOLIC PANEL
ANION GAP: 4 — AB (ref 5–15)
BUN: 11 mg/dL (ref 6–20)
CALCIUM: 8.5 mg/dL — AB (ref 8.9–10.3)
CO2: 27 mmol/L (ref 22–32)
Chloride: 108 mmol/L (ref 101–111)
Creatinine, Ser: 0.73 mg/dL (ref 0.44–1.00)
GFR calc non Af Amer: >60 mL/min (ref >60)
Glucose, Bld: 93 mg/dL (ref 65–99)
Potassium: 4.5 mmol/L (ref 3.5–5.1)
SODIUM: 139 mmol/L (ref 135–145)

## 2016-07-06 LAB — CBC
HEMATOCRIT: 28.4 % — AB (ref 36.0–46.0)
HEMOGLOBIN: 9.3 g/dL — AB (ref 12.0–15.0)
MCH: 30.3 pg (ref 26.0–34.0)
MCHC: 32.7 g/dL (ref 30.0–36.0)
MCV: 92.5 fL (ref 78.0–100.0)
Platelets: 144 10*3/uL — ABNORMAL LOW (ref 150–400)
RBC: 3.07 MIL/uL — ABNORMAL LOW (ref 3.87–5.11)
RDW: 13.4 % (ref 11.5–15.5)
WBC: 13 10*3/uL — AB (ref 4.0–10.5)

## 2016-07-06 LAB — PROTIME-INR
INR: 3.18
PROTHROMBIN TIME: 33.3 s — AB (ref 11.4–15.2)

## 2016-07-06 NOTE — Progress Notes (Addendum)
      [address]       ,[place] [address]             [phone]      4 Days Post-Op Procedure(s) (LRB): MINIMALLY INVASIVE MITRAL VALVE REPAIR (MVR) (Right) TRANSESOPHAGEAL ECHOCARDIOGRAM (TEE) (N/A) CLIPPING OF ATRIAL APPENDAGE (N/A) Subjective: Doing well. Pain is controlled with tylenol.   Objective: Vital signs in last 24 hours: Temp:  [98 F (36.7 C)-99.5 F (37.5 C)] 98 F (36.7 C) (12/03 0418) Pulse Rate:  [83-99] 86 (12/03 0418) Cardiac Rhythm: Normal sinus rhythm (12/02 1935) Resp:  [15-29] 20 (12/03 0418) BP: (97-137)/(47-65) 108/55 (12/03 0418) SpO2:  [93 %-99 %] 99 % (12/03 0418) Weight:  [120 lb 4.8 oz (54.6 kg)] 120 lb 4.8 oz (54.6 kg) (12/03 0418)     Intake/Output from previous day: 12/02 0701 - 12/03 0700 In: -  Out: [id number] [Urine:1550; Stool:1; Chest Tube:220] Intake/Output this shift: Total I/O In: -  Out: 150 [Urine:150]  General appearance: alert, cooperative and no distress Heart: regular rate and rhythm Lungs: clear to auscultation bilaterally Abdomen: soft, non-tender; bowel sounds normal; no masses,  no organomegaly Extremities: extremities normal, atraumatic, no cyanosis or edema Wound: clean and dry  Lab Results:  Recent Labs  07/05/16 0517 07/06/16 0408  WBC 14.7* 13.0*  HGB 9.4* 9.3*  HCT 28.3* 28.4*  PLT 123* 144*   BMET:  Recent Labs  07/05/16 0517 07/06/16 0408  NA 139 139  K 4.1 4.5  CL 107 108  CO2 26 27  GLUCOSE 88 93  BUN 11 11  CREATININE 0.78 0.73  CALCIUM 8.4* 8.5*    PT/INR:  Recent Labs  07/06/16 0408  LABPROT 33.3*  INR 3.18   ABG    Component Value Date/Time   PHART 7.440 07/03/2016 0915   HCO3 22.8 07/03/2016 0915   TCO2 23 07/03/2016 1623   ACIDBASEDEF 1.0 07/03/2016 0915   O2SAT 96.0 07/03/2016 0915   CBG (last 3)   Recent Labs  07/03/16 2348 07/04/16 0404 07/04/16 0844  GLUCAP 98 110* 146*    Assessment/Plan: S/P Procedure(s) (LRB): MINIMALLY INVASIVE  MITRAL VALVE REPAIR (MVR) (Right) TRANSESOPHAGEAL ECHOCARDIOGRAM (TEE) (N/A) CLIPPING OF ATRIAL APPENDAGE (N/A)  1. CV- HR NSR 70s. BP soft will hold off on Bb. INR continues to climb-3.18 today. Continue to hold coumadin. On Lovenox. 2. Pulm-Tolerating room air. CXR shows bilateral pleural fluid L > R and atelectasis on the left. Chest tube 266ml/24 hours. Will continue.  3. Renal- creatinine stable at 0.73. Weight continues to trend down. Lasix 40mg  daily. Good urine output 4. Endo-good control on current regimen 5. H and H stable  Plan: Continue chest tubes due to output. Holding BB. Continue diuresis for fluid overload. Hold coumadin for supratherapeutic INR.    LOS: 4 days    Elgie Collard 07/06/2016 Patient seen and examined, agree with above Continue to hold coumadin  Remo Lipps C. Roxan Hockey, MD [facility] [phone]

## 2016-07-07 LAB — TYPE AND SCREEN
BLOOD PRODUCT EXPIRATION DATE: 201712082359
BLOOD PRODUCT EXPIRATION DATE: 201712082359
BLOOD PRODUCT EXPIRATION DATE: 201712082359
Blood Product Expiration Date: 201712112359
ISSUE DATE / TIME: 201711231406
ISSUE DATE / TIME: 201711291017
ISSUE DATE / TIME: 201712021745
ISSUE DATE / TIME: 201712031416
UNIT TYPE AND RH: 9500
UNIT TYPE AND RH: 9500
Unit Type and Rh: 9500
Unit Type and Rh: 9500

## 2016-07-07 LAB — PROTIME-INR
INR: 2.17
Prothrombin Time: 24.5 seconds — ABNORMAL HIGH (ref 11.4–15.2)

## 2016-07-07 MED ORDER — WARFARIN - PHYSICIAN DOSING INPATIENT: Freq: Every day | Status: DC

## 2016-07-07 MED ORDER — WARFARIN SODIUM 2 MG PO TABS: 2.0000 mg | ORAL_TABLET | Freq: Once | ORAL | Status: DC

## 2016-07-07 MED ORDER — WARFARIN SODIUM 1 MG PO TABS
1.0000 mg | ORAL_TABLET | Freq: Once | ORAL | Status: AC
  Administered 2016-07-07: 1 mg via ORAL
  Filled 2016-07-07: qty 1

## 2016-07-07 NOTE — Consult Note (Signed)
   [facility] CM Inpatient Consult   07/07/2016  Nicole Norman Apr 01, 1946 [id number]   Spoke with [name] at bedside to offer and discuss [place] Management program services. She pleasantly declines [facility] Care Management program by stating " I just do not think I am going to need it". Accepted [place] Management brochure with contact information and 24-hr nurse line magnet to contact should she change her mind. Of note, [name] declined [place] Management during last hospitalization.  [name], MSN-Ed, RN,BSN [facility] Liaison [phone]

## 2016-07-07 NOTE — Progress Notes (Addendum)
      [address]       [place] [address]             [phone]        5 Days Post-Op Procedure(s) (LRB): MINIMALLY INVASIVE MITRAL VALVE REPAIR (MVR) (Right) TRANSESOPHAGEAL ECHOCARDIOGRAM (TEE) (N/A) CLIPPING OF ATRIAL APPENDAGE (N/A)  Subjective: Patient had loose stools yesterday. No specific complaint this am.  Objective: Vital signs in last 24 hours: Temp:  [98.2 F (36.8 C)-98.4 F (36.9 C)] 98.4 F (36.9 C) (12/04 0605) Pulse Rate:  [78-93] 93 (12/04 0605) Cardiac Rhythm: Normal sinus rhythm (12/03 2015) Resp:  [20] 20 (12/04 0605) BP: (108-118)/(44-61) 114/61 (12/04 0605) SpO2:  [98 %] 98 % (12/03 1900) Weight:  [117 lb (53.1 kg)] 117 lb (53.1 kg) (12/04 0605)  Pre op weight 55 kg Current Weight  07/07/16 117 lb (53.1 kg)      Intake/Output from previous day: 12/03 0701 - 12/04 0700 In: 720 [P.O.:720] Out: 832 [Urine:700; Chest Tube:132]   Physical Exam:  Cardiovascular: RRR Pulmonary: Slightly diminished at bases R>L Abdomen: Soft, non tender, bowel sounds present. Extremities: No lower extremity edema. Wounds: Dressing is clean and dry.    Lab Results: CBC: Recent Labs  07/05/16 0517 07/06/16 0408  WBC 14.7* 13.0*  HGB 9.4* 9.3*  HCT 28.3* 28.4*  PLT 123* 144*   BMET:  Recent Labs  07/05/16 0517 07/06/16 0408  NA 139 139  K 4.1 4.5  CL 107 108  CO2 26 27  GLUCOSE 88 93  BUN 11 11  CREATININE 0.78 0.73  CALCIUM 8.4* 8.5*    PT/INR:  Lab Results  Component Value Date   INR 2.17 07/07/2016   INR 3.18 07/06/2016   INR 2.90 07/05/2016   ABG:  INR: Will add last result for INR, ABG once components are confirmed Will add last 4 CBG results once components are confirmed  Assessment/Plan:  1. CV - SR/PVCs in the 90's. Not on BB as had bradycardia and required pacing. INR decreased from 3.18 to 2.17. Coumadin has been held last 2 days. Will likely restart low dose Coumadin tonight. 2.  Pulmonary - On room  air. Chest tubes with 132 cc of output last 24 hours. Will likely remove chest tubes.Encourage incentive spirometer 3. Volume Overload - On Lasix 40 mg daily. 4.  Acute blood loss anemia - H and H yesterday stable at 9.3 and 28.4 5. Mild thrombocytopenia-platelets yesterday up to 144,000 6. Stop stool softeners 7. Likely discharge in 2 days as will be able to know appropriate Coumadin dose.   ZIMMERMAN,DONIELLE MPA-C 07/07/2016,7:46 AM  I have seen and examined the patient and agree with the assessment and plan as outlined.  D/C pacing wires and chest tubes.  Restart coumadin 1 mg tonight.  Rexene Alberts, MD 07/07/2016 11:21 AM

## 2016-07-07 NOTE — Progress Notes (Signed)
CARDIAC REHAB PHASE I   PRE:  Rate/Rhythm: 94 first deg    BP: sitting 126/67    SaO2: 97 RA  MODE:  Ambulation: 900 ft   POST:  Rate/Rhythm: 108 ST with bigeminy PVCs    BP: sitting 126/58     SaO2: 98 RA  Pt has been walking independently with staff carrying CT. She got out of bed and walked independently with me, I carried CT. Tolerated well without c/o but noted SOB and bigeminy PVCs after walking. Return to bed. Encouraged 1-2 more walks today. [place], ACSM 07/07/2016 8:54 AM

## 2016-07-07 NOTE — Progress Notes (Signed)
Attempted to pull wires. Had resistance having CN-Nego pulled with resistance. Wires intact and clean. Pt. Tolerated procedure well.

## 2016-07-07 NOTE — Progress Notes (Signed)
Removed CT

## 2016-07-07 NOTE — Care Management Important Message (Signed)
Important Message  Patient Details  Name: NYRA DUSSEAULT MRN: [id number] Date of Birth: 1946/08/04   Medicare Important Message Given:  Yes    Mercer Stallworth Abena 07/07/2016, 11:16 AM

## 2016-07-08 ENCOUNTER — Ambulatory Visit: Payer: PPO | Admitting: Cardiovascular Disease

## 2016-07-08 ENCOUNTER — Inpatient Hospital Stay (HOSPITAL_COMMUNITY): Payer: PPO

## 2016-07-08 LAB — PROTIME-INR
INR: 1.98
PROTHROMBIN TIME: 22.8 s — AB (ref 11.4–15.2)

## 2016-07-08 MED ORDER — WARFARIN VIDEO: Freq: Once | Status: DC

## 2016-07-08 MED ORDER — METOPROLOL TARTRATE 12.5 MG HALF TABLET
12.5000 mg | ORAL_TABLET | Freq: Two times a day (BID) | ORAL | Status: DC
  Administered 2016-07-08 (×2): 12.5 mg via ORAL
  Filled 2016-07-08 (×3): qty 1

## 2016-07-08 MED ORDER — WARFARIN SODIUM 1 MG PO TABS
1.0000 mg | ORAL_TABLET | Freq: Once | ORAL | Status: AC
  Administered 2016-07-08: 1 mg via ORAL
  Filled 2016-07-08: qty 1

## 2016-07-08 MED ORDER — AMIODARONE HCL IN DEXTROSE 360-4.14 MG/200ML-% IV SOLN
60.0000 mg/h | INTRAVENOUS | Status: AC
  Administered 2016-07-08 (×2): 60 mg/h via INTRAVENOUS
  Filled 2016-07-08 (×2): qty 200

## 2016-07-08 MED ORDER — PATIENT'S GUIDE TO USING COUMADIN BOOK
Freq: Once | Status: DC
  Filled 2016-07-08: qty 1

## 2016-07-08 MED ORDER — AMIODARONE HCL IN DEXTROSE 360-4.14 MG/200ML-% IV SOLN
30.0000 mg/h | INTRAVENOUS | Status: AC
  Administered 2016-07-08 – 2016-07-09 (×2): 30 mg/h via INTRAVENOUS
  Filled 2016-07-08 (×2): qty 200

## 2016-07-08 MED ORDER — AMIODARONE LOAD VIA INFUSION
150.0000 mg | Freq: Once | INTRAVENOUS | Status: AC
  Administered 2016-07-08: 150 mg via INTRAVENOUS
  Filled 2016-07-08: qty 83.34

## 2016-07-08 NOTE — Progress Notes (Signed)
  Amiodarone Drug - Drug Interaction Consult Note  Recommendations: INR AND POTASSIUM MAY BE AFFECTED; please monitor closely (pharmacy will monitor peripherally).  Amiodarone is metabolized by the cytochrome P450 system and therefore has the potential to cause many drug interactions. Amiodarone has an average plasma half-life of 50 days (range 20 to 100 days).   There is potential for drug interactions to occur several weeks or months after stopping treatment and the onset of drug interactions may be slow after initiating amiodarone.   []  Statins: Increased risk of myopathy. Simvastatin- restrict dose to 20mg  daily. Other statins: counsel patients to report any muscle pain or weakness immediately.  [x]  Anticoagulants: Amiodarone can increase anticoagulant effect. Consider warfarin dose reduction. Patients should be monitored closely and the dose of anticoagulant altered accordingly, remembering that amiodarone levels take several weeks to stabilize. (ON COUMADIN)  []  Antiepileptics: Amiodarone can increase plasma concentration of phenytoin, the dose should be reduced. Note that small changes in phenytoin dose can result in large changes in levels. Monitor patient and counsel on signs of toxicity.  [x]  Beta blockers: increased risk of bradycardia, AV block and myocardial depression. Sotalol - avoid concomitant use. (ON LOPRESSOR)  []   Calcium channel blockers (diltiazem and verapamil): increased risk of bradycardia, AV block and myocardial depression.  []   Cyclosporine: Amiodarone increases levels of cyclosporine. Reduced dose of cyclosporine is recommended.  []  Digoxin dose should be halved when amiodarone is started.  [x]  Diuretics: increased risk of cardiotoxicity if hypokalemia occurs. (ON LASIX)  []  Oral hypoglycemic agents (glyburide, glipizide, glimepiride): increased risk of hypoglycemia. Patient's glucose levels should be monitored closely when initiating amiodarone therapy.    []  Drugs that prolong the QT interval:  Torsades de pointes risk may be increased with concurrent use - avoid if possible.  Monitor QTc, also keep magnesium/potassium WNL if concurrent therapy can't be avoided. Marland Kitchen Antibiotics: e.g. fluoroquinolones, erythromycin. . Antiarrhythmics: e.g. quinidine, procainamide, disopyramide, sotalol. . Antipsychotics: e.g. phenothiazines, haloperidol.  . Lithium, tricyclic antidepressants, and methadone.  Thank You,  Wynona Neat, PharmD, BCPS  07/08/2016 7:47 AM

## 2016-07-08 NOTE — Progress Notes (Signed)
CARDIAC REHAB PHASE I   PRE:  Rate/Rhythm: 79 1st degree heart block  BP:  Sitting: 96/52       SaO2: 97 RA  MODE:  Ambulation: 950 ft   POST:  Rate/Rhythm: 88 !st degree heart block  BP:  Sitting: 100/56         SaO2: 98 RA  Pt ambulated 950 ft on RA, IV, assist to push IV, otherwise independent, steady gait, tolerated well with no complaints, however, noted mild DOE. Encouraged IS, additional ambulation x1 today. Pt to bed per pt request after walk, call bell within reach. Will follow.   [place], RN, BSN 07/08/2016 1:29 PM

## 2016-07-08 NOTE — Progress Notes (Addendum)
      [address]       [place] [address]             [phone]        6 Days Post-Op Procedure(s) (LRB): MINIMALLY INVASIVE MITRAL VALVE REPAIR (MVR) (Right) TRANSESOPHAGEAL ECHOCARDIOGRAM (TEE) (N/A) CLIPPING OF ATRIAL APPENDAGE (N/A)  Subjective: Patient just waking up. She has no specific complaints at this time.  Objective: Vital signs in last 24 hours: Temp:  [98.1 F (36.7 C)-98.9 F (37.2 C)] 98.9 F (37.2 C) (12/05 0547) Pulse Rate:  [74-106] 106 (12/05 0547) Cardiac Rhythm: Normal sinus rhythm (12/04 2046) Resp:  [18] 18 (12/05 0547) BP: (96-122)/(49-66) 96/49 (12/05 0547) SpO2:  [97 %-98 %] 97 % (12/05 0547) Weight:  [114 lb 9.6 oz (52 kg)] 114 lb 9.6 oz (52 kg) (12/05 0547)  Pre op weight 55 kg Current Weight  07/08/16 114 lb 9.6 oz (52 kg)      Intake/Output from previous day: No intake/output data recorded.   Physical Exam:  Cardiovascular: IRRR IRRR Pulmonary: Slightly diminished at bases  Abdomen: Soft, non tender, bowel sounds present. Extremities: No lower extremity edema. Wounds:Clean and dry.    Lab Results: CBC:  Recent Labs  07/06/16 0408  WBC 13.0*  HGB 9.3*  HCT 28.4*  PLT 144*   BMET:   Recent Labs  07/06/16 0408  NA 139  K 4.5  CL 108  CO2 27  GLUCOSE 93  BUN 11  CREATININE 0.73  CALCIUM 8.5*    PT/INR:  Lab Results  Component Value Date   INR 1.98 07/08/2016   INR 2.17 07/07/2016   INR 3.18 07/06/2016   ABG:  INR: Will add last result for INR, ABG once components are confirmed Will add last 4 CBG results once components are confirmed  Assessment/Plan:  1. CV - First degree heart block/PVCs, bradycardia previously. She is in a fib with RVR this am. Will start Amiodarone drip and Lopressor. INR decreased from 2.17 to 1.98. Restarted on Coumadin 1 mg last night. 2.  Pulmonary - On room air. Chest tubes removed yesterday. CXR this am appears to show small bilateral pleural effusions,  no pneumothorax. Encourage incentive spirometer 3. Volume Overload - On Lasix 40 mg daily. 4.  Acute blood loss anemia - H and H yesterday stable at 9.3 and 28.4 5. Mild thrombocytopenia-platelets yesterday up to 144,000 6. Likely discharge in 2 days as will be able to know appropriate Coumadin dose.   ZIMMERMAN,DONIELLE MPA-C 07/08/2016,7:18 AM  I have seen and examined the patient and agree with the assessment and plan as outlined.  Now in Afib.  Continue amiodarone IV today and possibly convert to oral tomorrow if she goes back into NSR.  Will need to watch PR interval as she had relatively long 1st degree AV block prior to going into Afib.  Coumadin 1 mg tonight.  Rexene Alberts, MD 07/08/2016 8:05 AM

## 2016-07-09 ENCOUNTER — Encounter (HOSPITAL_COMMUNITY): Payer: Self-pay | Admitting: General Practice

## 2016-07-09 LAB — PROTIME-INR
INR: 2.17
PROTHROMBIN TIME: 24.5 s — AB (ref 11.4–15.2)

## 2016-07-09 LAB — CBC
HEMATOCRIT: 32.5 % — AB (ref 36.0–46.0)
HEMOGLOBIN: 10.7 g/dL — AB (ref 12.0–15.0)
MCH: 29.8 pg (ref 26.0–34.0)
MCHC: 32.9 g/dL (ref 30.0–36.0)
MCV: 90.5 fL (ref 78.0–100.0)
Platelets: 210 10*3/uL (ref 150–400)
RBC: 3.59 MIL/uL — AB (ref 3.87–5.11)
RDW: 13.6 % (ref 11.5–15.5)
WBC: 11.5 10*3/uL — ABNORMAL HIGH (ref 4.0–10.5)

## 2016-07-09 LAB — BASIC METABOLIC PANEL
ANION GAP: 9 (ref 5–15)
BUN: 8 mg/dL (ref 6–20)
CALCIUM: 8.9 mg/dL (ref 8.9–10.3)
CO2: 25 mmol/L (ref 22–32)
Chloride: 103 mmol/L (ref 101–111)
Creatinine, Ser: 0.79 mg/dL (ref 0.44–1.00)
GLUCOSE: 110 mg/dL — AB (ref 65–99)
POTASSIUM: 3.5 mmol/L (ref 3.5–5.1)
Sodium: 137 mmol/L (ref 135–145)

## 2016-07-09 LAB — MAGNESIUM: MAGNESIUM: 1.7 mg/dL (ref 1.7–2.4)

## 2016-07-09 MED ORDER — POTASSIUM CHLORIDE CRYS ER 20 MEQ PO TBCR
40.0000 meq | EXTENDED_RELEASE_TABLET | Freq: Once | ORAL | Status: AC
Start: 2016-07-09 — End: 2016-07-09
  Administered 2016-07-09: 40 meq via ORAL
  Filled 2016-07-09: qty 2

## 2016-07-09 MED ORDER — AMIODARONE HCL 200 MG PO TABS: 400.0000 mg | ORAL_TABLET | Freq: Two times a day (BID) | ORAL | Status: DC

## 2016-07-09 MED ORDER — WARFARIN SODIUM 2 MG PO TABS: 2.0000 mg | ORAL_TABLET | Freq: Every day | ORAL | 3 refills | Status: DC

## 2016-07-09 MED ORDER — PATIENT'S GUIDE TO USING COUMADIN BOOK
Freq: Once | Status: DC
  Filled 2016-07-09: qty 1

## 2016-07-09 MED ORDER — MAGNESIUM SULFATE 2 GM/50ML IV SOLN
2.0000 g | Freq: Once | INTRAVENOUS | Status: AC
  Administered 2016-07-09: 2 g via INTRAVENOUS
  Filled 2016-07-09: qty 50

## 2016-07-09 MED ORDER — POTASSIUM CHLORIDE CRYS ER 20 MEQ PO TBCR
20.0000 meq | EXTENDED_RELEASE_TABLET | Freq: Once | ORAL | Status: AC
  Administered 2016-07-09: 20 meq via ORAL
  Filled 2016-07-09: qty 1

## 2016-07-09 MED ORDER — POTASSIUM CHLORIDE CRYS ER 20 MEQ PO TBCR
20.0000 meq | EXTENDED_RELEASE_TABLET | Freq: Every day | ORAL | Status: DC
  Administered 2016-07-09: 20 meq via ORAL
  Filled 2016-07-09: qty 1

## 2016-07-09 MED ORDER — AMIODARONE HCL 200 MG PO TABS: 200.0000 mg | ORAL_TABLET | Freq: Two times a day (BID) | ORAL | 1 refills | Status: DC

## 2016-07-09 MED ORDER — METOPROLOL TARTRATE 25 MG PO TABS: 12.5000 mg | ORAL_TABLET | Freq: Two times a day (BID) | ORAL | 3 refills | Status: DC

## 2016-07-09 MED ORDER — TRAMADOL HCL 50 MG PO TABS: ORAL_TABLET | ORAL | 0 refills | Status: DC

## 2016-07-09 MED ORDER — AMIODARONE HCL 200 MG PO TABS
200.0000 mg | ORAL_TABLET | Freq: Two times a day (BID) | ORAL | Status: DC
  Administered 2016-07-09: 200 mg via ORAL
  Filled 2016-07-09: qty 1

## 2016-07-09 MED ORDER — FUROSEMIDE 40 MG PO TABS
40.0000 mg | ORAL_TABLET | Freq: Every day | ORAL | Status: DC
  Administered 2016-07-09: 40 mg via ORAL
  Filled 2016-07-09: qty 1

## 2016-07-09 NOTE — Progress Notes (Addendum)
[id number] Education completed with pt and husband who voiced understanding. Encouraged IS. Discussed watching sodium in diet and left heart healthy diet for review. Gave walking instruction for ex. Discussed CRP 2 but pt not interested. Left brochure for [place] in case she changes her mind. Offered to put on discharge video but pt declined as she has read [facility] booklet. Notified pharmacy that pt has not been seen for Coumadin ed. Pt quit smoking in November and plans to stay quit. Left smoking cessation handout. Stated she got fake cigarette last admission.  Graylon Good RN BSN 07/09/2016 11:55 AM

## 2016-07-09 NOTE — Progress Notes (Addendum)
      [address]       [place] [address]             [phone]        7 Days Post-Op Procedure(s) (LRB): MINIMALLY INVASIVE MITRAL VALVE REPAIR (MVR) (Right) TRANSESOPHAGEAL ECHOCARDIOGRAM (TEE) (N/A) CLIPPING OF ATRIAL APPENDAGE (N/A)  Subjective: Her only complaint is painful right wrist where IV infiltrated.  Objective: Vital signs in last 24 hours: Temp:  [98.4 F (36.9 C)-98.6 F (37 C)] 98.4 F (36.9 C) (12/06 0550) Pulse Rate:  [80-127] 82 (12/06 0600) Cardiac Rhythm: Heart block (12/05 2047) Resp:  [18] 18 (12/06 0550) BP: (81-118)/(35-70) 102/56 (12/06 0600) SpO2:  [97 %-98 %] 98 % (12/06 0550) Weight:  [113 lb 8 oz (51.5 kg)] 113 lb 8 oz (51.5 kg) (12/06 0550)  Pre op weight 55 kg Current Weight  07/09/16 113 lb 8 oz (51.5 kg)      Intake/Output from previous day: 12/05 0701 - 12/06 0700 In: 427.4 [P.O.:100; I.V.:327.4] Out: 250 [Urine:250]   Physical Exam:  Cardiovascular:RRR Pulmonary: Mostly clear Abdomen: Soft, non tender, bowel sounds present. Extremities: No lower extremity edema. Wounds:Clean and dry. Scant drainage from chest tube sites.  Lab Results: CBC:  Recent Labs  07/09/16 0337  WBC 11.5*  HGB 10.7*  HCT 32.5*  PLT 210   BMET:   Recent Labs  07/09/16 0337  NA 137  K 3.5  CL 103  CO2 25  GLUCOSE 110*  BUN 8  CREATININE 0.79  CALCIUM 8.9    PT/INR:  Lab Results  Component Value Date   INR 2.17 07/09/2016   INR 1.98 07/08/2016   INR 2.17 07/07/2016   ABG:  INR: Will add last result for INR, ABG once components are confirmed Will add last 4 CBG results once components are confirmed  Assessment/Plan:  1. CV - First degree heart block/PVCs, bradycardia previously. She went into a fib with RVR yesterday morning. She converted to SR yesterday. On Amiodarone drip and Lopressor 12.5 mg bid. Will stop Amiodarone drip and start oral Amiodarone. Will discuss with Dr. Roxy Manns if should stop  Lopressor.INR slightly increased from 1.98 to 2.17. Has received 2 doses of Coumadin 1 mg. Will restart baby ecasa at discharge. 2.  Pulmonary - On room air.  Encourage incentive spirometer 3. Volume Overload - On Lasix 40 mg daily. She is below pre op weight. She was on daily Lasix 40 mg prior to surgery but will likely not need at discharge. 4.  Acute blood loss anemia - H and H up to 10.7 and 32.5 5. Mild thrombocytopenia resolved-platelets up to 210,000 6. Supplement potassium 7. Right wrist IV infiltrated yesterday. It is swollen with slight erythema. Will discuss with Dr. Roxy Manns if should start oral Keflex.  ZIMMERMAN,DONIELLE MPA-C 07/09/2016,7:20 AM   I have seen and examined the patient and agree with the assessment and plan as outlined.  Doing well and maintaining NSR.  D/C home today.  Coumadin 2 mg/day for now.    Rexene Alberts, MD 07/09/2016 11:25 AM

## 2016-07-09 NOTE — Care Management Note (Signed)
Case Management Note Marvetta Gibbons RN, BSN Unit 2W-Case Manager [phone]  Patient Details  Name: Nicole Norman MRN: [id number] Date of Birth: 07-28-1946  Subjective/Objective:   Pt admitted s/p mini MVR -tx from ICU to 2W on 07/05/16               Action/Plan: PTA pt lived at home, plan to return home- no CM needs noted.   Expected Discharge Date:     07/09/16             Expected Discharge Plan:  Home/Self Care  In-House Referral:     Discharge planning Services  CM Consult  Post Acute Care Choice:    Choice offered to:     DME Arranged:    DME Agency:     HH Arranged:    HH Agency:     Status of Service:  Completed, signed off  If discussed at [employer] of Stay Meetings, dates discussed:    Additional Comments:  Dawayne Labrenda, RN 07/09/2016, 12:39 PM

## 2016-07-09 NOTE — Progress Notes (Signed)
Pt has been discharged home with husband. IV and telemetry box removed. Pt and pt's husband received discharge instructions and all questions were answered. Pt left with all of her belongings. Pt left the unit via wheelchair and was accompanied by a Wilfrid Lund and pt's husband.   Grant Fontana BSN, RN

## 2016-07-22 ENCOUNTER — Encounter: Payer: Self-pay | Admitting: Nurse Practitioner

## 2016-07-22 ENCOUNTER — Other Ambulatory Visit
Admission: RE | Admit: 2016-07-22 | Discharge: 2016-07-22 | Disposition: A | Discharge status: Home / Self Care | Payer: PPO | Source: Ambulatory Visit | Attending: Nurse Practitioner | Admitting: Nurse Practitioner

## 2016-07-22 ENCOUNTER — Ambulatory Visit (INDEPENDENT_AMBULATORY_CARE_PROVIDER_SITE_OTHER): Payer: Self-pay | Admitting: Physician Assistant

## 2016-07-22 ENCOUNTER — Ambulatory Visit (INDEPENDENT_AMBULATORY_CARE_PROVIDER_SITE_OTHER): Payer: PPO | Admitting: Nurse Practitioner

## 2016-07-22 ENCOUNTER — Telehealth: Payer: Self-pay | Admitting: Cardiology

## 2016-07-22 VITALS — BP 102/64 | HR 86 | Ht 59.5 in | Wt 113.5 lb

## 2016-07-22 VITALS — BP 105/66 | HR 79 | Resp 16 | Ht 59.0 in | Wt 110.5 lb

## 2016-07-22 DIAGNOSIS — I059 Rheumatic mitral valve disease, unspecified: Secondary | ICD-10-CM | POA: Diagnosis not present

## 2016-07-22 DIAGNOSIS — I340 Nonrheumatic mitral (valve) insufficiency: Secondary | ICD-10-CM

## 2016-07-22 DIAGNOSIS — Z9889 Other specified postprocedural states: Secondary | ICD-10-CM

## 2016-07-22 DIAGNOSIS — I480 Paroxysmal atrial fibrillation: Secondary | ICD-10-CM

## 2016-07-22 LAB — PROTIME-INR: Prothrombin Time: >90 seconds — ABNORMAL HIGH (ref 11.4–15.2)

## 2016-07-22 NOTE — Telephone Encounter (Signed)
Received call from lab regarding patient's INR, result is >11. I notified the patient to stop taking her coumadin and our office will call her in the am to schedule an appointment to check her INR. Likely tomorrow will be too soon to recheck it, Nicole Bayley, NP with follow up tomorrow with the patient.

## 2016-07-22 NOTE — Patient Instructions (Addendum)
Labwork: PT/INR    Testing/Procedures: Your physician has requested that you have an echocardiogram. Echocardiography is a painless test that uses sound waves to create images of your heart. It provides your doctor with information about the size and shape of your heart and how well your heart's chambers and valves are working. This procedure takes approximately one hour. There are no restrictions for this procedure.    Follow-Up: Your physician recommends that you schedule a follow-up appointment in: 3 months with Dr. Fletcher Anon  It was a pleasure seeing you today here in the office. Please do not hesitate to give [place] a call back if you have any further questions. [place], BSN

## 2016-07-22 NOTE — Progress Notes (Signed)
Office Visit    Patient Name: Nicole Norman Date of Encounter: 07/22/2016  Primary Care Provider:  Tracie Harrier, MD Primary Cardiologist:  Jerilynn Mages. Fletcher Anon, MD   Chief Complaint    70 y/o ? with a h/o MVP and progressive mitral valve regurgitation who is now s/p minimally invasive mitral valve repair.  Past Medical History    Past Medical History:  Diagnosis Date  . CHF (congestive heart failure) ([place])    a. In setting of severe MR; b. 06/2016 TEE: EF 65-60%.  Marland Kitchen COPD (chronic obstructive pulmonary disease) ([place])    pt. denies  . History of bronchitis   . History of pneumonia   . Incidental pulmonary nodule 06/13/2016   Multiple small ground glass opacities seen on chest CT scan  . Migraine    1x/month - uses Aleve  . Normal coronary arteries    a. cardiac cath 06/19/2016: normal coronary arteries and left dominant system, LVEDP nl, 4+ mitral regurgitation, RHC showed mildly elevated LVEDP (76mmHg), giant V waves noted on PCWP w/ mild pulmonary HTN., pulmonary pressure 39/10 with a mean of 27 mmHg, mildly reduced cardiac output at 3.1 with a cardiac index of 2.09, recommned mitral valve repair, acceptable volume status  . Paroxysmal SVT (supraventricular tachycardia) ([place])   . PVC's (premature ventricular contractions)    a. Holter 2015 with 26,000 PVCs in 48 hours  . 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 07/02/2016 Complex valvuloplasty including quadrangular resection of posterior leaflet, folding plasty, Gore-tex neochord placement x6 and 32 mm Sorin Memo 3D ring annuloplasty with clipping of LA appendage via right mini thoracotomy approach.  . Severe mitral regurgitation    a. TTE 06/05/16: EF 60-65%, nl WM, LV dias fxn nl, mod prolapse of posterior mitral leaflet with mod eccentric regurg, LA mildly dilated, PASP nl; b. TEE 06/18/16: EF 65-70%, LV mildly dilated, no valvular vegetations seen throughout, mitral valve w/ flail motion involving the  middle scallop of the post leaflet. Severe regurgitation directed eccentrically, LA mildly dilated  . Tobacco abuse   . Vertigo    no meds   Past Surgical History:  Procedure Laterality Date  . CARDIAC CATHETERIZATION N/A 06/19/2016   Procedure: Right/Left Heart Cath and Coronary Angiography;  Surgeon: Wellington Hampshire, MD;  Location: [facility];  Service: Cardiovascular;  Laterality: N/A;  . CESAREAN SECTION    . CHOLECYSTECTOMY    . CLIPPING OF ATRIAL APPENDAGE N/A 07/02/2016   Procedure: CLIPPING OF ATRIAL APPENDAGE;  Surgeon: Rexene Alberts, MD;  Location: [facility];  Service: Open Heart Surgery;  Laterality: N/A;  . COLONOSCOPY N/A 12/07/2014   Procedure: COLONOSCOPY;  Surgeon: Lucilla Lame, MD;  Location: [facility];  Service: Gastroenterology;  Laterality: N/A;  . ESOPHAGOGASTRODUODENOSCOPY N/A 12/07/2014   Procedure: ESOPHAGOGASTRODUODENOSCOPY (EGD);  Surgeon: Lucilla Lame, MD;  Location: [facility];  Service: Gastroenterology;  Laterality: N/A;  . EYE SURGERY Bilateral    lasic  . MITRAL VALVE REPAIR Right 07/02/2016   Procedure: MINIMALLY INVASIVE MITRAL VALVE REPAIR (MVR);  Surgeon: Rexene Alberts, MD;  Location: [facility];  Service: Open Heart Surgery;  Laterality: Right;  . TEE WITHOUT CARDIOVERSION N/A 06/18/2016   Procedure: TRANSESOPHAGEAL ECHOCARDIOGRAM (TEE);  Surgeon: Nelva Bush, MD;  Location: [facility];  Service: Cardiovascular;  Laterality: N/A;  . TEE WITHOUT CARDIOVERSION N/A 07/02/2016   Procedure: TRANSESOPHAGEAL ECHOCARDIOGRAM (TEE);  Surgeon: Rexene Alberts, MD;  Location: [facility];  Service: Open Heart Surgery;  Laterality: N/A;  . TONSILLECTOMY      Allergies  No Known Allergies  History of Present Illness    70 year old female with the above complex past medical history including mitral valve prolapse with progressive mitral regurgitation, palpitations, frequent PVCs, paroxysmal supraventricular tachycardia, tobacco abuse, COPD, and  progressive exertional dyspnea. Mitral valve prolapse and mitral regurgitation have been followed with surveillance echoes over the past few years but in November, she had acute worsening of dyspnea with findings of pulmonary edema and possible pneumonia. She was admitted to [facility] and treated. At one point, she had supraventricular tachycardia and required adenosine. A transesophageal echo was performed and showed mitral valve prolapse with flail segment of the posterior leaflet and severe mitral regurgitation. Left and right heart cardiac catheterization did not show any significant coronary disease. She was transferred to [facility] for further cardiothoracic surgical evaluation. She subsequently underwent minimally invasive mitral valve repair on November 29. Postoperative course was Located by paroxysmal atrial fibrillation and she has been on amiodarone since. She was subsequently discharged and remains on Coumadin with a plan to continue this for 3 months.   Since her discharge, she has done fairly well. She denies chest pain, dyspnea, PND, orthopnea, dizziness, syncope, edema, or early satiety. About 3 days ago, she started having left arm pain that is worse with any movement of the left arm including supination and pronation of forearm. She is not aware of any recent trauma.  Home Medications    Prior to Admission medications   Medication Sig Start Date End Date Taking? Authorizing Provider  acetaminophen (TYLENOL) 325 MG tablet Take 2 tablets (650 mg total) by mouth every 6 (six) hours as needed for mild pain (or Fever >/= 101). 06/19/16  Yes Loletha Grayer, MD  amiodarone (PACERONE) 200 MG tablet Take 1 tablet (200 mg total) by mouth 2 (two) times daily. For 10 days;then take Amiodarone 200 mg by mouth daily thereafter 07/09/16  Yes Donielle Liston Alba, PA-C  metoprolol tartrate (LOPRESSOR) 25 MG tablet Take 0.5 tablets (12.5 mg total) by mouth 2 (two) times daily. 07/09/16  Yes Erin  R Barrett, PA-C  traMADol (ULTRAM) 50 MG tablet Take 50 mg by mouth every 4-6 hours PRN moderate or severe pain. 07/09/16  Yes Donielle Liston Alba, PA-C  warfarin (COUMADIN) 2 MG tablet Take 1 tablet (2 mg total) by mouth daily at 6 PM. 07/09/16  Yes Erin R Barrett, PA-C    Review of Systems    Left arm pain as above.  All other systems reviewed and are otherwise negative except as noted above.  Physical Exam    VS:  BP 102/64 (BP Location: Left Arm, Patient Position: Sitting, Cuff Size: Normal)   Pulse 86   Ht 4' 11.5" (1.511 m)   Wt 113 lb 8 oz (51.5 kg)   BMI 22.54 kg/m  , BMI Body mass index is 22.54 kg/m. GEN: Well nourished, well developed, in no acute distress.  HEENT: normal.  Neck: Supple, no JVD, carotid bruits, or masses. Cardiac: RRR, no murmurs, rubs, or gallops. No clubbing, cyanosis, edema.  Radials/DP/PT 2+ and equal bilaterally.  Respiratory:  Respirations regular and unlabored, clear to auscultation bilaterally. GI: Soft, nontender, nondistended, BS + x 4. MS: no deformity or atrophy. Skin: warm and dry, no rash. Neuro:  Strength and sensation are intact. Psych: Normal affect.  Accessory Clinical Findings    Hospital records including echocardiograms, labs, and surgical report were reviewed in detail.  Assessment &  Plan    1.  Mitral valve prolapse with severe mitral regurgitation: Status post minimally invasive mitral valve repair on November 29. She has done quite well since her surgery and has been ambulating without symptoms of chest pain or dyspnea. She does feel as though she has a new lease on life.  She remains on Coumadin therapy with a plan to continue this for 3 months per thoracic surgery. She has not had an INR since her discharge because she missed her Coumadin clinic follow-up. We will send off an INR today and will also arrange for follow-up in our Coumadin clinic here in [place] next week.  2. Postoperative atrial fibrillation: She denies any  recent palpitations. She is tolerating amiodarone.  3.  Tobacco Abuse:  She quit smoking with her last hospitalization and has not resumed since discharge.  She was congratulated on this and advised to remain off of cigarettes.  4. Dispo:  F/u echo to establish new baseline.  She has f/u with Dr. Roxy Manns in January and I will arrange for f/u with our coumadin clinic for next week (INR today), and Dr. Fletcher Anon in ~ 3 months or sooner if necessary.   Murray Hodgkins, NP 07/22/2016, 4:16 PM

## 2016-07-22 NOTE — Progress Notes (Signed)
HPI:  Ms. Bossard presented for suture removal today.  During her nurse visit she mentioned she has been experiencing elbow and arm pain on her left upper extremity.  She states this started acutely 3 days ago and it just hurts.  She denies numbness but states that if she keeps it in a position to eliminate the pain for a period of time that it will go numb.  There is no erythema, bruising, or swelling present.  She denies trauma to the arm.     Current Outpatient Prescriptions  Medication Sig Dispense Refill  . amiodarone (PACERONE) 200 MG tablet Take 1 tablet (200 mg total) by mouth 2 (two) times daily. For 10 days;then take Amiodarone 200 mg by mouth daily thereafter 60 tablet 1  . metoprolol tartrate (LOPRESSOR) 25 MG tablet Take 0.5 tablets (12.5 mg total) by mouth 2 (two) times daily. 60 tablet 3  . traMADol (ULTRAM) 50 MG tablet Take 50 mg by mouth every 4-6 hours PRN moderate or severe pain. 30 tablet 0  . warfarin (COUMADIN) 2 MG tablet Take 1 tablet (2 mg total) by mouth daily at 6 PM. 30 tablet 3  . acetaminophen (TYLENOL) 325 MG tablet Take 2 tablets (650 mg total) by mouth every 6 (six) hours as needed for mild pain (or Fever >/= 101). (Patient not taking: Reported on 07/22/2016)    . aspirin 81 MG tablet Take 81 mg by mouth daily. AM    . Calcium Carbonate-Vitamin D (CALCIUM-VITAMIN D) 500-200 MG-UNIT per tablet Take 1 tablet by mouth daily. 10 AM    . Cholecalciferol (VITAMIN D3) 2000 UNITS TABS Take 1 tablet by mouth daily. 10 AM    . furosemide (LASIX) 40 MG tablet Take 1 tablet (40 mg total) by mouth daily. (Patient not taking: Reported on 07/22/2016) 30 tablet 1  . potassium chloride SA (K-DUR,KLOR-CON) 20 MEQ tablet Take 1 tablet (20 mEq total) by mouth daily. (Patient not taking: Reported on 07/22/2016) 30 tablet 1   No current facility-administered medications for this visit.     Physical Exam:  BP 105/66 (BP Location: Right Arm, Patient Position: Sitting, Cuff Size:  Normal)   Pulse 79   Resp 16   Ht 4\' 11"  (1.499 m)   Wt 110 lb 8 oz (50.1 kg)   SpO2 96% Comment: ON RA  BMI 22.32 kg/m   Gen: no apparent distress Heart: RRR Lungs: CTA LUE: arm looks benign, no swelling, or erythema present, full range of motion, equal grip strength with right and left arm... Radial pulse WNL  A/P:  1. LUE arm pain- unsure what could be source of this.  Being this started acutely 3 days ago, it is unlikely this is related to her surgery.  She did have an arterial line placed during surgery, but there is good radial pulse present 2. Suggest patient to rest her arm, RTC in 4 weeks for post operative visit.  If symptoms get worse or do not improve at that time... Can refer to Orthopedics for further workup  Ellwood Handler, PA-C [facility] [phone]

## 2016-07-23 ENCOUNTER — Telehealth: Payer: Self-pay | Admitting: *Deleted

## 2016-07-23 DIAGNOSIS — I340 Nonrheumatic mitral (valve) insufficiency: Secondary | ICD-10-CM

## 2016-07-23 MED ORDER — PHYTONADIONE 5 MG PO TABS: 5.0000 mg | ORAL_TABLET | Freq: Once | ORAL | 0 refills | Status: AC

## 2016-07-23 NOTE — Telephone Encounter (Signed)
Spoke with patient and let her know to come by the office to pick up a Vit. K pill to take. She verbalized understanding and had no further questions at this time.

## 2016-07-23 NOTE — Telephone Encounter (Signed)
Reviewed abnormal results from yesterday and recommendations. Instructed her to hold coumadin today, go pick up Vitamin K to take today, and then come back in tomorrow to the [facility] Entrance to have repeat lab done. She verbalized understanding and had no further questions at this time.

## 2016-07-23 NOTE — Telephone Encounter (Signed)
-----   Message from Rogelia Mire, NP sent at 07/23/2016  7:48 AM EST ----- Pt contacted by call team last night re: INR > 11. Advised to hold coumadin.  Please send in vitamin K 5 mg po x 1 to be taken this morning.  She will need a f/u INR tomorrow morning.

## 2016-07-23 NOTE — Telephone Encounter (Signed)
Patient called in stating that her pharmacy states that it will be hours before her prescription would be ready and that there was an error with the prescription. Let her know that I would call and make sure that it gets filled as soon as possible. She verbalized understanding and had no further questions.

## 2016-07-23 NOTE — Telephone Encounter (Addendum)
Patient here in office and provided her with 1 tablet of Vitamin K 5 mg and lab slip to have her PT/INR checked again tomorrow. Called over 6 pharmicies and unable to find anyone who had this in stock so obtained one from main hospital pharmacy and patient took pill while here in our office. Patient verbalized understanding to come in tomorrow to have her coumadin level checked over at the [facility] entrance.

## 2016-07-24 ENCOUNTER — Other Ambulatory Visit
Admission: RE | Admit: 2016-07-24 | Discharge: 2016-07-24 | Disposition: A | Discharge status: Home / Self Care | Payer: PPO | Source: Ambulatory Visit | Attending: Nurse Practitioner | Admitting: Nurse Practitioner

## 2016-07-24 DIAGNOSIS — I340 Nonrheumatic mitral (valve) insufficiency: Secondary | ICD-10-CM | POA: Insufficient documentation

## 2016-07-24 LAB — PROTIME-INR
INR: 3.65
Prothrombin Time: 37.2 seconds — ABNORMAL HIGH (ref 11.4–15.2)

## 2016-07-29 ENCOUNTER — Ambulatory Visit (INDEPENDENT_AMBULATORY_CARE_PROVIDER_SITE_OTHER): Payer: PPO

## 2016-07-29 ENCOUNTER — Other Ambulatory Visit
Admission: RE | Admit: 2016-07-29 | Discharge: 2016-07-29 | Disposition: A | Discharge status: Home / Self Care | Payer: PPO | Source: Ambulatory Visit | Attending: Cardiovascular Disease | Admitting: Cardiovascular Disease

## 2016-07-29 ENCOUNTER — Other Ambulatory Visit: Payer: Self-pay

## 2016-07-29 DIAGNOSIS — Z9889 Other specified postprocedural states: Secondary | ICD-10-CM

## 2016-07-29 DIAGNOSIS — Z5181 Encounter for therapeutic drug level monitoring: Secondary | ICD-10-CM | POA: Insufficient documentation

## 2016-07-29 DIAGNOSIS — I340 Nonrheumatic mitral (valve) insufficiency: Secondary | ICD-10-CM | POA: Diagnosis not present

## 2016-07-29 DIAGNOSIS — Z7901 Long term (current) use of anticoagulants: Secondary | ICD-10-CM | POA: Diagnosis not present

## 2016-07-29 LAB — PROTIME-INR
INR: 2
Prothrombin Time: 23 seconds — ABNORMAL HIGH (ref 11.4–15.2)

## 2016-07-30 ENCOUNTER — Other Ambulatory Visit: Payer: PPO

## 2016-08-01 ENCOUNTER — Telehealth: Payer: Self-pay | Admitting: Cardiovascular Disease

## 2016-08-01 NOTE — Telephone Encounter (Signed)
Patient had left voicemail on my phone and wanted to discuss coumadin. Spoke with patient and she wanted to know about her coumadin checks and scheduling. Confirmed upcoming appointment with her and she verbalized understanding. Let her know the importance of routine monitoring of her levels and that they will discuss this with her at that appointment. She verbalized understanding of our conversation and had no further questions at this time.

## 2016-08-06 ENCOUNTER — Ambulatory Visit (INDEPENDENT_AMBULATORY_CARE_PROVIDER_SITE_OTHER): Payer: PPO

## 2016-08-06 DIAGNOSIS — Z9889 Other specified postprocedural states: Secondary | ICD-10-CM | POA: Diagnosis not present

## 2016-08-06 DIAGNOSIS — Z5181 Encounter for therapeutic drug level monitoring: Secondary | ICD-10-CM

## 2016-08-06 DIAGNOSIS — I340 Nonrheumatic mitral (valve) insufficiency: Secondary | ICD-10-CM

## 2016-08-06 LAB — POCT INR: INR: 1.4

## 2016-08-06 NOTE — Patient Instructions (Signed)

## 2016-08-08 ENCOUNTER — Other Ambulatory Visit: Payer: Self-pay | Admitting: Thoracic Surgery (Cardiothoracic Vascular Surgery)

## 2016-08-08 DIAGNOSIS — Z9889 Other specified postprocedural states: Secondary | ICD-10-CM

## 2016-08-11 ENCOUNTER — Ambulatory Visit
Admission: RE | Admit: 2016-08-11 | Discharge: 2016-08-11 | Disposition: A | Discharge status: Home / Self Care | Payer: PPO | Source: Ambulatory Visit | Attending: Thoracic Surgery (Cardiothoracic Vascular Surgery) | Admitting: Thoracic Surgery (Cardiothoracic Vascular Surgery)

## 2016-08-11 ENCOUNTER — Ambulatory Visit (INDEPENDENT_AMBULATORY_CARE_PROVIDER_SITE_OTHER): Payer: Self-pay | Admitting: Surgical

## 2016-08-11 VITALS — BP 124/68 | HR 73 | Resp 16 | Ht 59.5 in | Wt 111.5 lb

## 2016-08-11 DIAGNOSIS — R918 Other nonspecific abnormal finding of lung field: Secondary | ICD-10-CM | POA: Diagnosis not present

## 2016-08-11 DIAGNOSIS — Z9889 Other specified postprocedural states: Secondary | ICD-10-CM

## 2016-08-11 DIAGNOSIS — I340 Nonrheumatic mitral (valve) insufficiency: Secondary | ICD-10-CM

## 2016-08-11 MED ORDER — AMIODARONE HCL 200 MG PO TABS: 100.0000 mg | ORAL_TABLET | Freq: Every day | ORAL | 1 refills | Status: DC

## 2016-08-11 MED ORDER — AMIODARONE HCL 200 MG PO TABS: 100.0000 mg | ORAL_TABLET | Freq: Two times a day (BID) | ORAL | 1 refills | Status: DC

## 2016-08-11 NOTE — Progress Notes (Signed)
[address]       [place] [address]             [phone]                  Dianna C Hassell [place] Medical Record [id number] Date of Birth: 15-Dec-1945  Referring [id number], Mertie Clause, MD Primary Cardiology: San Morelle, MD  Chief Complaint:  Follow Up Visit  CARDIOTHORACIC SURGERY OPERATIVE NOTE  Date of Procedure:                            07/02/2016  Preoperative Diagnosis:                  Severe Mitral Regurgitation  Postoperative Diagnosis:                Same  Procedure:        Minimally-Invasive Mitral Valve Repair             Complex valvuloplasty including quadrangular resection of posterior leaflet             Folding plasty of posterior leaflet             Gore-tex neochord placement x6             Sorin Memo 3D Ring Annuloplasty (size 37mm, catalog #[id number], serial [id number])             Clipping of Left Atrial Appendage (Atricure left atrial clip, size 40 mm)  Surgeon:        Valentina Gu. 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 She reports that she is doing quite well. She is no longer having any difficulty with her elbow which was having some pain during last visit. She is having no palpitations or tachycardia that she is aware of. She is not having difficulty with shortness of breath. She is ambulating well and stamina is improving slowly over time. She is not having any fevers, chills  or other significant constitutional symptoms. She is anxious to go back to work. She works as a Radio broadcast assistant.     Zubrod Score: At the time of surgery this patient's most appropriate activity status/level should be described as: []     0    Normal activity, no symptoms []     1    Restricted in physical strenuous activity but ambulatory, able to do out light work []     2    Ambulatory and capable of self care, unable to do work activities, up and about                 >50 % of waking hours                                                                                   []   3    Only limited self care, in bed greater than 50% of waking hours []     4    Completely disabled, no self care, confined to bed or chair []     5    Moribund  History  Smoking Status  . Former Smoker  . Packs/day: 0.50  . Years: 45.00  . Types: Cigarettes  . Quit date: 07/02/2016  Smokeless Tobacco  . Never Used    Comment: quit date 06/13/16       No Known Allergies  Current Outpatient Prescriptions  Medication Sig Dispense Refill  . acetaminophen (TYLENOL) 325 MG tablet Take 2 tablets (650 mg total) by mouth every 6 (six) hours as needed for mild pain (or Fever >/= 101).    Marland Kitchen amiodarone (PACERONE) 200 MG tablet Take 1 tablet (200 mg total) by mouth 2 (two) times daily. For 10 days;then take Amiodarone 200 mg by mouth daily thereafter 60 tablet 1  . metoprolol tartrate (LOPRESSOR) 25 MG tablet Take 0.5 tablets (12.5 mg total) by mouth 2 (two) times daily. 60 tablet 3  . warfarin (COUMADIN) 2 MG tablet Take 1 tablet (2 mg total) by mouth daily at 6 PM. 30 tablet 3   No current facility-administered medications for this visit.        Physical Exam: BP 124/68 (BP Location: Right Arm, Patient Position: Sitting, Cuff Size: Normal)   Pulse 73   Resp 16   Ht 4' 11.5" (1.511 m)   Wt 111 lb 8 oz (50.6 kg)   SpO2 99% Comment: ON RA  BMI 22.14 kg/m   General appearance: alert, cooperative and no  distress Heart: regular rate and rhythm and No murmur Lungs: clear to auscultation bilaterally Abdomen: soft, non-tender; bowel sounds normal; no masses,  no organomegaly Extremities: No peripheral edema Wounds: Incisions healing well without evidence of infection.   Diagnostic Studies & Laboratory data:         Recent Radiology Findings: Dg Chest 2 View  Result Date: 08/11/2016 CLINICAL DATA:  71 year old female with a history of mitral valve repair EXAM: CHEST  2 VIEW COMPARISON:  07/08/2016, 07/06/2016 FINDINGS: Cardiomediastinal silhouette is unchanged with surgical changes of mitral a valve annuloplasty, left atrial amputation/clip. Calcifications of the aortic arch. Improving aeration of the right lung with minimal linear opacities. No confluent airspace disease. Blunting of the costophrenic sulcus on the lateral view, improved from the comparison. No pneumothorax. IMPRESSION: Improving appearance of the pleural fluid and right-sided airspace opacity, with no evidence of lobar pneumonia. Surgical changes of mitral annuloplasty and left atrial amputation/ clip. Aortic atherosclerosis. Signed, Dulcy Fanny. Earleen Newport, DO [facility] Radiology Electronically Signed   By: Corrie Mckusick D.O.   On: 08/11/2016 14:13      I have independently reviewed the above radiology findings and reviewed findings  with the patient.  Recent Labs: Lab Results  Component Value Date   WBC 11.5 (H) 07/09/2016   HGB 10.7 (L) 07/09/2016   HCT 32.5 (L) 07/09/2016   PLT 210 07/09/2016   GLUCOSE 110 (H) 07/09/2016   CHOL 138 06/16/2016   TRIG 112 06/16/2016   HDL 50 06/16/2016   LDLCALC 66 06/16/2016   ALT 13 (L) 06/30/2016   AST 22 06/30/2016   NA 137 07/09/2016   K 3.5 07/09/2016   CL 103 07/09/2016   CREATININE 0.79 07/09/2016   BUN 8 07/09/2016   CO2 25 07/09/2016   TSH 1.287 06/14/2016   INR 1.4  08/06/2016   HGBA1C 5.5 06/30/2016      Assessment /  Plan:  The patient is doing quite well. She is being followed closely in the Coumadin clinic. She is currently at 1 mg every 6 PM. She is scheduled for recheck in 2 days. She will continue her amiodarone until she runs out and we decrease dose to 100 mg daily. We discussed activity progression including cardiac rehabilitation and driving. She is not currently interested in the formal cardiac rehabilitation program at this time but does understand need to have a steady increase in her activity levels as part of her rehabilitation. We will see her again in 2 months or prior to that for any concerns that develop.      Tahj Lindseth E 08/11/2016 2:37 PM

## 2016-08-11 NOTE — Patient Instructions (Signed)
  Discussed activity progression and driving instructions. Discussed the importance of a daily walking program if she is not to have formal cardiac rehabilitation.

## 2016-08-13 ENCOUNTER — Telehealth: Payer: Self-pay

## 2016-08-13 ENCOUNTER — Ambulatory Visit (INDEPENDENT_AMBULATORY_CARE_PROVIDER_SITE_OTHER): Payer: PPO

## 2016-08-13 DIAGNOSIS — Z9889 Other specified postprocedural states: Secondary | ICD-10-CM

## 2016-08-13 DIAGNOSIS — I340 Nonrheumatic mitral (valve) insufficiency: Secondary | ICD-10-CM

## 2016-08-13 DIAGNOSIS — Z5181 Encounter for therapeutic drug level monitoring: Secondary | ICD-10-CM | POA: Diagnosis not present

## 2016-08-13 LAB — POCT INR: INR: 1.8

## 2016-08-13 NOTE — Telephone Encounter (Signed)
Pt was seen in Coumadin Clinic today, she states she saw [facility] on Monday and they stated she could reduce her dosage of Amiodarone to 1/2 tablet BID.  Pt states she called back up to their office to clarify, but has not received a call back.  I do not see a telephone note in pt's chart so I told pt I would forward a message to them to call her back.  Pt states she has #53 tablets left in her bottle of Amiodarone, at visit she advised the provider she wasn't sure how many tablets she had left.  He told her she could start taking 1/2 tablet BID until they were gone. Pt wants to know if she needs to stay on Amiodarone for 2 more months because he made it sound like she would be finished with medication sooner.  Upon reading Jadene Pierini, [place] note in [place] from 08/11/16 he states pt will decrease Amiodarone to 100mg  QD.  Pt has Amiodarone 200mg  tablets, so I advised pt that would mean she needed to only take 1/2 tablet once daily.  Pt states this was not what she remembered being told to do in the office at visit.  Please call pt back and advise on correct Amiodarone dosage instructions.  Thanks.

## 2016-08-27 ENCOUNTER — Ambulatory Visit (INDEPENDENT_AMBULATORY_CARE_PROVIDER_SITE_OTHER): Payer: PPO | Admitting: *Deleted

## 2016-08-27 DIAGNOSIS — I340 Nonrheumatic mitral (valve) insufficiency: Secondary | ICD-10-CM | POA: Diagnosis not present

## 2016-08-27 DIAGNOSIS — Z5181 Encounter for therapeutic drug level monitoring: Secondary | ICD-10-CM

## 2016-08-27 DIAGNOSIS — Z9889 Other specified postprocedural states: Secondary | ICD-10-CM

## 2016-08-27 LAB — POCT INR: INR: 2.2

## 2016-08-27 NOTE — Telephone Encounter (Signed)
Pt seen in Coumadin Clinic again today she still need clarification on this message that was originally sent on 08/13/16 on her Amiodarone dosage. She has also called office and no call back. Pt, is currently taking the Amiodarone 200mg  (1/2 tablet) BID. Pt states she is due to stop at end of the month, but your note from 08/11/16 states until she runs out, she has approx 40 tablets left.  Please call pt to clarify.

## 2016-09-10 ENCOUNTER — Ambulatory Visit (INDEPENDENT_AMBULATORY_CARE_PROVIDER_SITE_OTHER): Payer: PPO

## 2016-09-10 DIAGNOSIS — I340 Nonrheumatic mitral (valve) insufficiency: Secondary | ICD-10-CM

## 2016-09-10 DIAGNOSIS — Z5181 Encounter for therapeutic drug level monitoring: Secondary | ICD-10-CM

## 2016-09-10 DIAGNOSIS — Z9889 Other specified postprocedural states: Secondary | ICD-10-CM

## 2016-09-10 LAB — POCT INR: INR: 1.6

## 2016-09-17 ENCOUNTER — Ambulatory Visit (INDEPENDENT_AMBULATORY_CARE_PROVIDER_SITE_OTHER): Payer: PPO

## 2016-09-17 DIAGNOSIS — Z9889 Other specified postprocedural states: Secondary | ICD-10-CM

## 2016-09-17 DIAGNOSIS — I340 Nonrheumatic mitral (valve) insufficiency: Secondary | ICD-10-CM

## 2016-09-17 DIAGNOSIS — Z5181 Encounter for therapeutic drug level monitoring: Secondary | ICD-10-CM | POA: Diagnosis not present

## 2016-09-17 LAB — POCT INR: INR: 1.9

## 2016-09-23 ENCOUNTER — Encounter (HOSPITAL_COMMUNITY): Payer: Self-pay | Admitting: Internal Medicine

## 2016-09-23 NOTE — Progress Notes (Signed)
Mailed patient letter with information about Cardiac Rehab program. MW °

## 2016-09-25 ENCOUNTER — Telehealth: Payer: Self-pay | Admitting: Cardiovascular Disease

## 2016-09-25 NOTE — Telephone Encounter (Signed)
See additional Feb 22 phone note.

## 2016-09-25 NOTE — Telephone Encounter (Signed)
Pt reports episodes of dizziness and seeing black clouds during the episodes.  This has been going on over a week and and sx last around 3-5 minutes. She feels like there is a lot of pressure in her head even when not feeling dizzy which could be sinus pressure.  Pt denies any other sx.  Reviewed medications; she was told to stop amiodarone in January.  She takes metoprolol 12.5mg  BID but does not monitor VS. Advised pt to check BP and HR when she feels dizzy as it could be r/t low BP/HR.  She is at work at this time with no complaints. She will monitor VS tonight and tomorrow morning and call tomorrow afternoon with readings. Reviewed BP medication parameters.  She verbalized understanding and will proceed to the ED if sx worsen.

## 2016-09-25 NOTE — Telephone Encounter (Signed)
Patient has been having dizzy spells .  She sees black clouding a feels like she may pass out she feels pressure in her head  For a little over a week  Patient has had several episodes while driving as well.    Please call.

## 2016-10-01 ENCOUNTER — Ambulatory Visit (INDEPENDENT_AMBULATORY_CARE_PROVIDER_SITE_OTHER): Payer: PPO

## 2016-10-01 DIAGNOSIS — Z5181 Encounter for therapeutic drug level monitoring: Secondary | ICD-10-CM

## 2016-10-01 DIAGNOSIS — Z9889 Other specified postprocedural states: Secondary | ICD-10-CM

## 2016-10-01 DIAGNOSIS — I340 Nonrheumatic mitral (valve) insufficiency: Secondary | ICD-10-CM | POA: Diagnosis not present

## 2016-10-01 LAB — POCT INR: INR: 3.5

## 2016-10-09 ENCOUNTER — Ambulatory Visit (INDEPENDENT_AMBULATORY_CARE_PROVIDER_SITE_OTHER): Payer: PPO | Admitting: Cardiovascular Disease

## 2016-10-09 ENCOUNTER — Encounter: Payer: Self-pay | Admitting: Cardiovascular Disease

## 2016-10-09 VITALS — BP 128/78 | HR 73 | Ht 59.5 in | Wt 120.8 lb

## 2016-10-09 DIAGNOSIS — Z9889 Other specified postprocedural states: Secondary | ICD-10-CM | POA: Diagnosis not present

## 2016-10-09 DIAGNOSIS — I493 Ventricular premature depolarization: Secondary | ICD-10-CM

## 2016-10-09 NOTE — Patient Instructions (Signed)

## 2016-10-09 NOTE — Progress Notes (Signed)
Cardiology Office Note   Date:  10/09/2016   ID:  Nicole Norman, DOB 1945-08-15, MRN [id number]  PCP:  Tracie Harrier, MD  Cardiologist:   Kathlyn Sacramento, MD   Chief Complaint  Patient presents with  . other    3 month follow up as well as discuss Echo. Meds reviewed by the pt. verbally. Pt. c/o feeling dizzy and confused.       History of Present Illness: Nicole Norman is a 71 y.o. female who presents for a follow-up visit regarding mitral valve prolapse with regurgitation status post minimally invasive mitral valve repair in November 2017. Cardiac catheterization before mitral valve surgery showed no significant coronary artery disease. She has no history of diabetes, hypertension or hyperlipidemia.  She quit smoking in November.  She had previous frequent PVCs. Echocardiogram in December showed an EF of 50-55% with intact mitral valve with a mean gradient of 5 mmHg without regurgitation. The left atrium was mildly dilated. She has been doing extremely well from a cardiac standpoint with no chest pain, shortness of breath or palpitations. She does complain of intermittent episodes of dizziness, blurred vision and balance problems. She checked her blood pressure at home and it has been in the normal range. Carotid Doppler in November showed no significant carotid disease.  Past Medical History:  Diagnosis Date  . CHF (congestive heart failure) ([place])    a. In setting of severe MR; b. 06/2016 TEE: EF 65-60%.  Marland Kitchen COPD (chronic obstructive pulmonary disease) ([place])    pt. denies  . History of bronchitis   . History of pneumonia   . Incidental pulmonary nodule 06/13/2016   Multiple small ground glass opacities seen on chest CT scan  . Migraine    1x/month - uses Aleve  . Normal coronary arteries    a. cardiac cath 06/19/2016: normal coronary arteries and left dominant system, LVEDP nl, 4+ mitral regurgitation, RHC showed mildly elevated LVEDP (47mmHg), giant V  waves noted on PCWP w/ mild pulmonary HTN., pulmonary pressure 39/10 with a mean of 27 mmHg, mildly reduced cardiac output at 3.1 with a cardiac index of 2.09, recommned mitral valve repair, acceptable volume status  . Paroxysmal SVT (supraventricular tachycardia) ([place])   . PVC's (premature ventricular contractions)    a. Holter 2015 with 26,000 PVCs in 48 hours  . 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 07/02/2016 Complex valvuloplasty including quadrangular resection of posterior leaflet, folding plasty, Gore-tex neochord placement x6 and 32 mm Sorin Memo 3D ring annuloplasty with clipping of LA appendage via right mini thoracotomy approach.  . Severe mitral regurgitation    a. TTE 06/05/16: EF 60-65%, nl WM, LV dias fxn nl, mod prolapse of posterior mitral leaflet with mod eccentric regurg, LA mildly dilated, PASP nl; b. TEE 06/18/16: EF 65-70%, LV mildly dilated, no valvular vegetations seen throughout, mitral valve w/ flail motion involving the middle scallop of the post leaflet. Severe regurgitation directed eccentrically, LA mildly dilated  . Tobacco abuse   . Vertigo    no meds    Past Surgical History:  Procedure Laterality Date  . CARDIAC CATHETERIZATION N/A 06/19/2016   Procedure: Right/Left Heart Cath and Coronary Angiography;  Surgeon: Wellington Hampshire, MD;  Location: [place] [facility];  Service: Cardiovascular;  Laterality: N/A;  . CESAREAN SECTION    . CHOLECYSTECTOMY    . CLIPPING OF ATRIAL APPENDAGE N/A 07/02/2016   Procedure: CLIPPING OF ATRIAL APPENDAGE;  Surgeon: Rexene Alberts, MD;  Location: [facility];  Service: Open Heart Surgery;  Laterality: N/A;  . COLONOSCOPY N/A 12/07/2014   Procedure: COLONOSCOPY;  Surgeon: Lucilla Lame, MD;  Location: [place];  Service: Gastroenterology;  Laterality: N/A;  . ESOPHAGOGASTRODUODENOSCOPY N/A 12/07/2014   Procedure: ESOPHAGOGASTRODUODENOSCOPY (EGD);  Surgeon: Lucilla Lame, MD;  Location: [facility];   Service: Gastroenterology;  Laterality: N/A;  . EYE SURGERY Bilateral    lasic  . MITRAL VALVE REPAIR Right 07/02/2016   Procedure: MINIMALLY INVASIVE MITRAL VALVE REPAIR (MVR);  Surgeon: Rexene Alberts, MD;  Location: [place];  Service: Open Heart Surgery;  Laterality: Right;  . TEE WITHOUT CARDIOVERSION N/A 06/18/2016   Procedure: TRANSESOPHAGEAL ECHOCARDIOGRAM (TEE);  Surgeon: Nelva Bush, MD;  Location: [facility];  Service: Cardiovascular;  Laterality: N/A;  . TEE WITHOUT CARDIOVERSION N/A 07/02/2016   Procedure: TRANSESOPHAGEAL ECHOCARDIOGRAM (TEE);  Surgeon: Rexene Alberts, MD;  Location: [place];  Service: Open Heart Surgery;  Laterality: N/A;  . TONSILLECTOMY       Current Outpatient Prescriptions  Medication Sig Dispense Refill  . acetaminophen (TYLENOL) 325 MG tablet Take 2 tablets (650 mg total) by mouth every 6 (six) hours as needed for mild pain (or Fever >/= 101).    . metoprolol tartrate (LOPRESSOR) 25 MG tablet Take 0.5 tablets (12.5 mg total) by mouth 2 (two) times daily. 60 tablet 3  . warfarin (COUMADIN) 2 MG tablet Take 1 tablet (2 mg total) by mouth daily at 6 PM. 30 tablet 3   No current facility-administered medications for this visit.     Allergies:   Patient has no known allergies.    Social History:  The patient  reports that she quit smoking about 3 months ago. Her smoking use included Cigarettes. She has a 22.50 pack-year smoking history. She has never used smokeless tobacco. She reports that she drinks alcohol. She reports that she does not use drugs.   Family History:  The patient's family history includes Alzheimer's disease in her mother; Breast cancer (age of onset: 31) in her sister; Cancer in her brother, brother, sister, and sister; Stroke in her father.    ROS:  Please see the history of present illness.   Otherwise, review of systems are positive for none.   All other systems are reviewed and negative.    PHYSICAL EXAM: VS:  BP 128/78 (BP  Location: Left Arm, Patient Position: Sitting, Cuff Size: Normal)   Pulse 73   Ht 4' 11.5" (1.511 m)   Wt 120 lb 12 oz (54.8 kg)   BMI 23.98 kg/m  , BMI Body mass index is 23.98 kg/m. GEN: Well nourished, well developed, in no acute distress  HEENT: normal  Neck: no JVD, carotid bruits, or masses Cardiac: RRR ; no  Rubs, murmurs or gallops,no edema . Respiratory:  clear to auscultation bilaterally, normal work of breathing GI: soft, nontender, nondistended, + BS MS: no deformity or atrophy  Skin: warm and dry, no rash Neuro:  Strength and sensation are intact Psych: euthymic mood, full affect   EKG:  EKG is not ordered today.    Recent Labs: 06/14/2016: TSH 1.287 06/19/2016: B Natriuretic Peptide 113.3 06/30/2016: ALT 13 07/09/2016: BUN 8; Creatinine, Ser 0.79; Hemoglobin 10.7; Magnesium 1.7; Platelets 210; Potassium 3.5; Sodium 137    Lipid Panel    Component Value Date/Time   CHOL 138 06/16/2016 0410   TRIG 112 06/16/2016 0410   HDL 50 06/16/2016 0410   CHOLHDL 2.8 06/16/2016 0410   VLDL 22  06/16/2016 0410   LDLCALC 66 06/16/2016 0410      Wt Readings from Last 3 Encounters:  10/09/16 120 lb 12 oz (54.8 kg)  08/11/16 111 lb 8 oz (50.6 kg)  07/22/16 113 lb 8 oz (51.5 kg)       No flowsheet data found.    ASSESSMENT AND PLAN:  1.  Mitral valve disease with mitral valve prolapse: Status post mitral valve repair in November 2017. She is doing extremely well with no symptoms at all. She continues to be on anticoagulation with warfarin which can likely be discontinued in the near future after her follow-up with Dr. Roxy Manns.  2. Previous PVCs: She denies any current palpitations and I do not appreciate any premature beats by physical exam.  3. Previous tobacco use:  He quit smoking in November.  4. Postoperative atrial fibrillation: No evidence of recurrent arrhythmia. She is off amiodarone.  5. Intermittent episodes of dizziness, blurred vision and balance  problems: The exact etiology is not entirely clear and does not seem to be related to her cardiac conditions. Carotid Doppler before surgery did not show significant disease. I advised her to follow-up with her primary care physician about this.    Disposition:   FU with me in 6 months.   Signed,  Kathlyn Sacramento, MD  10/09/2016 11:50 AM    [place]

## 2016-10-13 ENCOUNTER — Encounter: Payer: PPO | Admitting: Thoracic Surgery (Cardiothoracic Vascular Surgery)

## 2016-10-15 ENCOUNTER — Telehealth: Payer: Self-pay | Admitting: Cardiovascular Disease

## 2016-10-15 ENCOUNTER — Ambulatory Visit (INDEPENDENT_AMBULATORY_CARE_PROVIDER_SITE_OTHER): Payer: PPO

## 2016-10-15 DIAGNOSIS — Z5181 Encounter for therapeutic drug level monitoring: Secondary | ICD-10-CM

## 2016-10-15 DIAGNOSIS — I340 Nonrheumatic mitral (valve) insufficiency: Secondary | ICD-10-CM

## 2016-10-15 DIAGNOSIS — Z9889 Other specified postprocedural states: Secondary | ICD-10-CM | POA: Diagnosis not present

## 2016-10-15 LAB — POCT INR: INR: 2.2

## 2016-10-15 NOTE — Telephone Encounter (Signed)
She can stop Metoprolol.

## 2016-10-15 NOTE — Telephone Encounter (Signed)
Reviewed w/pt who is appreciative of the call. Medication list updated.

## 2016-10-15 NOTE — Telephone Encounter (Signed)
Pt inquires if she may stop metoprolol 12.5mg  BID as she feels well and want to "get off some of these medications".  Dec 2015 event monitor showed frequent PVCs; metoprolol 25mg  BID added Switched to diltiazem January 2016 for better PVC control then changed back to metoprolol after pt reported she could not tolerate diltiazem.  She has been taking decreased dose of metoprolol (12.5mg  BID) since November mitral valve repair. She would like to d/c metoprolol. Routed to MD to advise.

## 2016-10-15 NOTE — Telephone Encounter (Signed)
Patient wants to know if she should continue taking metoprolol.  Please call to discuss.

## 2016-10-20 ENCOUNTER — Other Ambulatory Visit: Payer: Self-pay | Admitting: Thoracic Surgery (Cardiothoracic Vascular Surgery)

## 2016-10-20 ENCOUNTER — Ambulatory Visit
Admission: RE | Admit: 2016-10-20 | Discharge: 2016-10-20 | Disposition: A | Discharge status: Home / Self Care | Payer: PPO | Source: Ambulatory Visit | Attending: Thoracic Surgery (Cardiothoracic Vascular Surgery) | Admitting: Thoracic Surgery (Cardiothoracic Vascular Surgery)

## 2016-10-20 ENCOUNTER — Encounter: Payer: Self-pay | Admitting: Thoracic Surgery (Cardiothoracic Vascular Surgery)

## 2016-10-20 ENCOUNTER — Ambulatory Visit: Payer: Self-pay | Admitting: Pharmacist

## 2016-10-20 ENCOUNTER — Ambulatory Visit (INDEPENDENT_AMBULATORY_CARE_PROVIDER_SITE_OTHER): Payer: PPO | Admitting: Thoracic Surgery (Cardiothoracic Vascular Surgery)

## 2016-10-20 ENCOUNTER — Telehealth: Payer: Self-pay | Admitting: Cardiovascular Disease

## 2016-10-20 VITALS — BP 132/80 | HR 88 | Resp 20 | Ht 59.0 in | Wt 120.0 lb

## 2016-10-20 DIAGNOSIS — I340 Nonrheumatic mitral (valve) insufficiency: Secondary | ICD-10-CM

## 2016-10-20 DIAGNOSIS — Z9889 Other specified postprocedural states: Secondary | ICD-10-CM | POA: Diagnosis not present

## 2016-10-20 DIAGNOSIS — Z952 Presence of prosthetic heart valve: Secondary | ICD-10-CM | POA: Diagnosis not present

## 2016-10-20 DIAGNOSIS — I25119 Atherosclerotic heart disease of native coronary artery with unspecified angina pectoris: Secondary | ICD-10-CM

## 2016-10-20 DIAGNOSIS — Z5181 Encounter for therapeutic drug level monitoring: Secondary | ICD-10-CM

## 2016-10-20 MED ORDER — ASPIRIN EC 81 MG PO TBEC: 81.0000 mg | DELAYED_RELEASE_TABLET | Freq: Every day | ORAL | Status: AC

## 2016-10-20 NOTE — Telephone Encounter (Signed)
Pt wanted [place] to know Dr. Roxy Manns took her off her Warfarin.

## 2016-10-20 NOTE — Telephone Encounter (Signed)
Noted and chart updated

## 2016-10-20 NOTE — Patient Instructions (Signed)
Stop taking Coumadin (warfarin) and begin taking aspirin 81 mg/day  You may resume unrestricted physical activity without any particular limitations at this time.  Endocarditis is a potentially serious infection of heart valves or inside lining of the heart.  It occurs more commonly in patients with diseased heart valves (such as patient's with aortic or mitral valve disease) and in patients who have undergone heart valve repair or replacement.  Certain surgical and dental procedures may put you at risk, such as dental cleaning, other dental procedures, or any surgery involving the respiratory, urinary, gastrointestinal tract, gallbladder or prostate gland.   To minimize your chances for develooping endocarditis, maintain good oral health and seek prompt medical attention for any infections involving the mouth, teeth, gums, skin or urinary tract.    Always notify your doctor or dentist about your underlying heart valve condition before having any invasive procedures. You will need to take antibiotics before certain procedures, including all routine dental cleanings or other dental procedures.  Your cardiologist or dentist should prescribe these antibiotics for you to be taken ahead of time.

## 2016-10-20 NOTE — Progress Notes (Signed)
[address]       [place] [address]             [phone]     CARDIOTHORACIC SURGERY OFFICE NOTE  Referring Provider is Wellington Hampshire, MD PCP is Tracie Harrier, MD   HPI:  Patient is a 71 year old female with history of mitral valve prolapse with severe symptomatic primary mitral regurgitation, long-standing tobacco abuse and COPD who returns to the office today for routine follow-up status post minimally invasive mitral valve repair on 07/02/2016. Her early postoperative recovery was uneventful and she was last seen here in our office on 08/11/2016 at which time she was recovering fairly well.  She had a routine follow-up echocardiogram performed 07/29/2016 that revealed intact mitral valve repair with no residual mitral regurgitation and preserved left ventricular systolic function. Ejection fraction was estimated 50-55%. Since then the patient has continued to do very well. She was seen in follow-up by Dr. Fletcher Anon earlier this month then she returns to our office for routine follow-up today. She reports that she is doing exceptionally well. She no longer has any significant pain or soreness related to her surgery. Her activity level is completely normal and she denies any symptoms of exertional shortness of breath or chest discomfort. She reports that her breathing is much better than it was prior to surgery. She is not having any palpitations or dizzy spells. She remains on Coumadin but she is hopeful that it can be stopped at this time. She does report that she has occasional episodes of transient blurry vision that are preceded by an aura. She states that sometimes these symptoms are associated with a pressure-like feeling across her head and sometimes she feels mildly dizzy. Symptoms are global and not unilateral. Symptoms are always associated by a preceding oral: The patient feels as though she knows they're coming on. Typically they resolved within a few  minutes. She has a history of migraine headaches in the past.   Current Outpatient Prescriptions  Medication Sig Dispense Refill  . acetaminophen (TYLENOL) 325 MG tablet Take 2 tablets (650 mg total) by mouth every 6 (six) hours as needed for mild pain (or Fever >/= 101).    . warfarin (COUMADIN) 2 MG tablet Take 1 tablet (2 mg total) by mouth daily at 6 PM. 30 tablet 3   No current facility-administered medications for this visit.       Physical Exam:   BP 132/80   Pulse 88   Resp 20   Ht 4\' 11"  (1.499 m)   Wt 120 lb (54.4 kg)   SpO2 97% Comment: RA  BMI 24.24 kg/m   General:  Well-appearing  Chest:   Clear to auscultation  CV:   Regular rate and rhythm without murmur  Incisions:  Completely healed  Abdomen:  Soft nontender  Extremities:  Warm and well-perfused  Diagnostic Tests:  Transthoracic Echocardiography  Patient:    Nicole Norman, Nicole Norman MR #:       [id number] Study Date: 07/29/2016 Gender:     F Age:        4 Height:     151.1 cm Weight:     51.5 kg BSA:        1.48 m^2 Pt. Status: Room:   ATTENDING    Murray Hodgkins, MD  ORDERING     Murray Hodgkins, MD  [place], MD  PERFORMING   [place], [employer] [facility]  SONOGRAPHER  Pilar Jarvis, RVT,  RDCS, RDMS  cc:  ------------------------------------------------------------------- LV EF: 50% -   55%  ------------------------------------------------------------------- History:   PMH:  Patient is s/p MV repair on 07/02/16.  Atrial fibrillation.  Mitral valve disease.  ------------------------------------------------------------------- Study Conclusions  - Left ventricle: The cavity size was normal. There was mild   concentric hypertrophy. Systolic function was normal. The   estimated ejection fraction was in the range of 50% to 55%. Wall   motion was normal; there were no regional wall motion   abnormalities. The study is not technically sufficient to allow   evaluation of LV  diastolic function. - Mitral valve: Prior procedures included surgical repair. An   annular ring prosthesis was present and functioning normally. The   findings are consistent with mild stenosis. Mean gradient (D): 5   mm Hg. Valve area by continuity equation (using LVOT flow): 1.36   cm^2. - Left atrium: The atrium was mildly dilated.  ------------------------------------------------------------------- Study data:  The previous study was not available, so comparison was made to the report of November 2017.  Study status:  Routine. Procedure:  Transthoracic echocardiography. Image quality was fair. The study was technically difficult, as a result of poor acoustic windows.  Study completion:  There were no complications. Transthoracic echocardiography.  M-mode, complete 2D, spectral Doppler, and color Doppler.  Birthdate:  Patient birthdate: 1946/04/20.  Age:  Patient is 71 yr old.  Sex:  Gender: female. BMI: 22.5 kg/m^2.  Blood pressure:     108/60  Patient status: Outpatient.  Study date:  Study date: 07/29/2016. Study time: 11:48 AM.  -------------------------------------------------------------------  ------------------------------------------------------------------- Left ventricle:  The cavity size was normal. There was mild concentric hypertrophy. Systolic function was normal. The estimated ejection fraction was in the range of 50% to 55%. Wall motion was normal; there were no regional wall motion abnormalities. The study is not technically sufficient to allow evaluation of LV diastolic function.  ------------------------------------------------------------------- Aortic valve:   Trileaflet; normal thickness leaflets. Mobility was not restricted.  Doppler:  Transvalvular velocity was within the normal range. There was no stenosis. There was no regurgitation.   ------------------------------------------------------------------- Aorta:  Aortic root: The aortic root was  normal in size.  ------------------------------------------------------------------- Mitral valve:  Prior procedures included surgical repair. An annular ring prosthesis was present and functioning normally. Doppler:   The findings are consistent with mild stenosis.   There was no regurgitation.    Valve area by continuity equation (using LVOT flow): 1.36 cm^2. Indexed valve area by continuity equation (using LVOT flow): 0.92 cm^2/m^2.    Mean gradient (D): 5 mm Hg. Peak gradient (D): 8 mm Hg.  ------------------------------------------------------------------- Left atrium:  The atrium was mildly dilated.  ------------------------------------------------------------------- Right ventricle:  The cavity size was normal. Wall thickness was normal. Systolic function was normal.  ------------------------------------------------------------------- Pulmonic valve:    Doppler:  Transvalvular velocity was within the normal range. There was no evidence for stenosis.  ------------------------------------------------------------------- Tricuspid valve:   Structurally normal valve.    Doppler: Transvalvular velocity was within the normal range. There was no regurgitation.  ------------------------------------------------------------------- Pulmonary artery:   The main pulmonary artery was normal-sized. Systolic pressure could not be accurately estimated.  ------------------------------------------------------------------- Right atrium:  The atrium was normal in size.  ------------------------------------------------------------------- Pericardium:  There was no pericardial effusion.  ------------------------------------------------------------------- Systemic veins: Inferior vena cava: The vessel was normal in size.  ------------------------------------------------------------------- Measurements   Left ventricle  Value          Reference  LV ID,  ED, PLAX chordal                  45    mm       43 - 52  LV ID, ES, PLAX chordal                  31.8  mm       23 - 38  LV fx shortening, PLAX chordal           29    %        >=29  LV PW thickness, ED                      11    mm       ----------  IVS/LV PW ratio, ED                      1.14           <=1.3  Stroke volume, 2D                        56    ml       ----------  Stroke volume/bsa, 2D                    38    ml/m^2   ----------  LV ejection fraction, 1-p A4C            52    %        ----------  LV end-diastolic volume, 2-p             78    ml       ----------  LV end-systolic volume, 2-p              39    ml       ----------  LV ejection fraction, 2-p                51    %        ----------  Stroke volume, 2-p                       40    ml       ----------  LV end-diastolic volume/bsa, 2-p         53    ml/m^2   ----------  LV end-systolic volume/bsa, 2-p          26    ml/m^2   ----------  Stroke volume/bsa, 2-p                   26.7  ml/m^2   ----------  LV e&', lateral                           6.2   cm/s     ----------  LV E/e&', lateral                         22.1           ----------  LV e&', medial  5.22  cm/s     ----------  LV E/e&', medial                          26.25          ----------  LV e&', average                           5.71  cm/s     ----------  LV E/e&', average                         23.99          ----------    Ventricular septum                       Value          Reference  IVS thickness, ED                        12.5  mm       ----------    LVOT                                     Value          Reference  LVOT ID, S                               20    mm       ----------  LVOT area                                3.14  cm^2     ----------  LVOT ID                                  20    mm       ----------  LVOT peak velocity, S                    91.6  cm/s     ----------  LVOT mean velocity, S                     55.6  cm/s     ----------  LVOT VTI, S                              17.9  cm       ----------  Stroke volume (SV), LVOT DP              56.2  ml       ----------  Stroke index (SV/bsa), LVOT DP           38.1  ml/m^2   ----------    Aorta                                    Value          Reference  Aortic root ID,  ED                       32    mm       ----------  Ascending aorta ID, A-P, S               30    mm       ----------    Left atrium                              Value          Reference  LA ID, A-P, ES                           36    mm       ----------  LA ID/bsa, A-P                   (H)     2.44  cm/m^2   <=2.2  LA volume, S                             57.9  ml       ----------  LA volume/bsa, S                         39.2  ml/m^2   ----------  LA volume, ES, 1-p A4C                   50    ml       ----------  LA volume/bsa, ES, 1-p A4C               33.9  ml/m^2   ----------  LA volume, ES, 1-p A2C                   55.8  ml       ----------  LA volume/bsa, ES, 1-p A2C               37.8  ml/m^2   ----------    Mitral valve                             Value          Reference  Mitral E-wave peak velocity              137   cm/s     ----------  Mitral A-wave peak velocity              119   cm/s     ----------  Mitral mean velocity, D                  94.2  cm/s     ----------  Mitral deceleration time         (H)     268   ms       150 - 230  Mitral mean gradient, D                  5     mm Hg    ----------  Mitral peak gradient, D                  8  mm Hg    ----------  Mitral E/A ratio, peak                   1.2            ----------  Mitral valve area, LVOT                  1.36  cm^2     ----------  continuity  Mitral valve area/bsa, LVOT              0.92  cm^2/m^2 ----------  continuity  Mitral annulus VTI, D                    41.4  cm       ----------    Right atrium                             Value          Reference  RA ID, S-I, ES, A4C                       45.4  mm       34 - 49  RA area, ES, A4C                         11.2  cm^2     8.3 - 19.5  RA volume, ES, A/L                       22    ml       ----------  RA volume/bsa, ES, A/L                   14.9  ml/m^2   ----------    Right ventricle                          Value          Reference  RV ID, minor axis, ED, A4C base          35    mm       ----------  TAPSE                                    17.7  mm       ----------  RV s&', lateral, S                        9.79  cm/s     ----------    Pulmonic valve                           Value          Reference  Pulmonic valve peak velocity, S          61.1  cm/s     ----------  Legend: (L)  and  (H)  mark values outside specified reference range.  ------------------------------------------------------------------- Prepared and Electronically Authenticated by  Kathlyn Sacramento, MD 2017-12-26T17:20:00   CHEST  2 VIEW  COMPARISON:  08/11/2016  FINDINGS: Cardiomediastinal silhouette is stable. Again noted status post mitral valve repair. Atrial appendage clip is unchanged in position. No infiltrate or  pulmonary edema. Mild streaky scarring in right midlung with slight improvement in aeration. Bony thorax is unremarkable.  IMPRESSION: Again noted status post mitral valve repair. Atrial appendage clip is unchanged in position. No infiltrate or pulmonary edema. Mild streaky scarring in right midlung with slight improvement in aeration.   Electronically Signed   By: Lahoma Crocker M.D.   On: 10/20/2016 09:10   Impression:  Patient is doing very well more than 3 months status post minimally invasive mitral valve repair. She describes occasional transient symptoms of blurry vision, pressure-like sensation across her head, and mild dizziness that sound most consistent with likely migraine headaches.    Plan:  I have instructed the patient to stop taking warfarin and begin taking aspirin 81 mg daily.  She may resume unrestricted physical activity. If her transient symptoms of blurry vision and dizziness persist I suggested that she see a neurologist to investigate the possibility of chronic migraine headaches.  The patient has been reminded regarding the importance of dental hygiene and the lifelong need for antibiotic prophylaxis for all dental cleanings and other related invasive procedures.  She will continue to follow-up with Dr. Fletcher Anon and return to our office next December, approximately 1 year following her original surgery. She will call and return sooner should specific problems or questions arise.  I spent in excess of 15 minutes during the conduct of this office consultation and >50% of this time involved direct face-to-face encounter with the patient for counseling and/or coordination of their care.    Valentina Gu. Roxy Manns, MD 10/20/2016 9:38 AM

## 2016-10-27 ENCOUNTER — Encounter: Payer: PPO | Admitting: Thoracic Surgery (Cardiothoracic Vascular Surgery)

## 2016-12-16 DIAGNOSIS — H43399 Other vitreous opacities, unspecified eye: Secondary | ICD-10-CM | POA: Diagnosis not present

## 2017-01-20 ENCOUNTER — Telehealth: Payer: Self-pay | Admitting: Cardiovascular Disease

## 2017-01-20 DIAGNOSIS — Z9889 Other specified postprocedural states: Secondary | ICD-10-CM | POA: Diagnosis not present

## 2017-01-20 DIAGNOSIS — R531 Weakness: Secondary | ICD-10-CM | POA: Diagnosis not present

## 2017-01-20 DIAGNOSIS — E538 Deficiency of other specified B group vitamins: Secondary | ICD-10-CM | POA: Diagnosis not present

## 2017-01-20 DIAGNOSIS — R51 Headache: Secondary | ICD-10-CM | POA: Diagnosis not present

## 2017-01-20 DIAGNOSIS — I38 Endocarditis, valve unspecified: Secondary | ICD-10-CM | POA: Diagnosis not present

## 2017-01-20 DIAGNOSIS — R55 Syncope and collapse: Secondary | ICD-10-CM

## 2017-01-20 DIAGNOSIS — R42 Dizziness and giddiness: Secondary | ICD-10-CM | POA: Diagnosis not present

## 2017-01-20 NOTE — Telephone Encounter (Signed)
Spoke w/ pt.  Advised her of Ryan's recommendation. She verbalizes understanding and is agreeable to wearing 48 hr holter monitor. However, she states that she has to work and will continue to drive, as "99 times out of 100, it doesn't happen while I'm sitting". Discussed this w/ her again at length, but I do not believe that this will dissuade her from driving. Call transferred to the front desk to schedule placement of monitor.

## 2017-01-20 NOTE — Telephone Encounter (Signed)
Pt states she has been lightheaded and dizzy for a couple of months,. States she has had to pull over on the side of the road, or sit down before she feels better. Pt is schedule for 7/3

## 2017-01-20 NOTE — Telephone Encounter (Signed)
This was noted back in March at her last office visit with Dr. Fletcher Anon. Recent carotid doppler without significant disease. Recent echo with preserved EF and well functioning mitral valve. Has been advised to see PCP and neurologist for possible chronic/atypical migraine disorder. Prior event monitor in 07/2014, showed frequent PVCs; metoprolol 25mg  BID added at that time. Switched to diltiazem January 2016 for better PVC control then changed back to metoprolol after pt reported she could not tolerate diltiazem. Metoprolol was ultimately stopped in 10/2016. Recommend she wear a 48 Holter. Would ideally like to hold on restarting metoprolol at this time in an effort to obtain baseline data on rate/rhythm/ectopy. May need to restart metoprolol though pending monitor. I too agree to not drive at this time given unclear etiology of her symptoms. Patient must have someone transport her until the etiology of her symptoms is discovered and adequately managed.

## 2017-01-20 NOTE — Telephone Encounter (Signed)
Spoke w/ pt.  She reports that she feels that her chest feels heavy and has for the past few months. She reports chest discomfort now, but states that it is almost always present. Dr. Ginette Pitman has ordered a CT scan of head and neck, he recommended that she also see Dr. Fletcher Anon.  She reports episodes where her eyes glass over, she feels dizzy and zones out.  She will feel worn out after these episodes and wants to sleep for hours afterwards. She reports that episodes are increasing more frequently and are happening almost daily. She still works, drives herself to work every day and picks her grandchildren up every afternoon. She has become symptomatic while driving and had to pull over several times. Strongly encouraged her to have someone else drive her and discussed w/ her the dangers to herself and others if sx come on suddenly and she does not have time to pull over.  She has appt sched w/ neurology for eval, but wanted to make [place] aware, as PCP does not seem to have any answers for her.  She has not checked her BP or HR during spells. As Dr. Fletcher Anon is out of the office, she asks that someone review her chart and see if they have any other suggestions.  Advised her that I will route to [employer], [place], but her sx do not sound cardiac related.  She is appreciative of the call.

## 2017-01-21 ENCOUNTER — Ambulatory Visit (INDEPENDENT_AMBULATORY_CARE_PROVIDER_SITE_OTHER): Payer: PPO

## 2017-01-21 DIAGNOSIS — R55 Syncope and collapse: Secondary | ICD-10-CM | POA: Diagnosis not present

## 2017-01-26 NOTE — Telephone Encounter (Signed)
Patient dropped off monitor at office this morning.  Patient asking if it is documented that she was instructed not to drive.  See note below. Patient wants this removed from her health record please call to discuss.    Patient also stated that she has since not to drive when symptomatic.  Patient would like to know what dr. Fletcher Anon thinks .

## 2017-01-26 NOTE — Telephone Encounter (Signed)
S/w patient. Patient states she has only been symptomatic while driving on one occasion and that the symptoms have not occurred since she picked up the monitor on 01/21/17. Discussed with patient that not driving at this time is for her safety and the safety of other since she does not know when the episode may occur. Advised to continue to not drive until Dr Fletcher Anon has reviewed results of monitor and until she sees him next week on 02/03/17. She verbalized understanding of importance of not driving at this time and will continue to have her husband drive her. She was very understanding.

## 2017-01-29 ENCOUNTER — Other Ambulatory Visit (HOSPITAL_COMMUNITY): Payer: Self-pay | Admitting: Internal Medicine

## 2017-01-29 ENCOUNTER — Other Ambulatory Visit: Payer: Self-pay | Admitting: Internal Medicine

## 2017-01-29 DIAGNOSIS — R531 Weakness: Secondary | ICD-10-CM

## 2017-01-29 DIAGNOSIS — R42 Dizziness and giddiness: Secondary | ICD-10-CM

## 2017-01-29 DIAGNOSIS — R1013 Epigastric pain: Secondary | ICD-10-CM | POA: Diagnosis not present

## 2017-01-29 DIAGNOSIS — K5909 Other constipation: Secondary | ICD-10-CM | POA: Diagnosis not present

## 2017-01-29 DIAGNOSIS — K219 Gastro-esophageal reflux disease without esophagitis: Secondary | ICD-10-CM | POA: Diagnosis not present

## 2017-01-30 ENCOUNTER — Ambulatory Visit: Payer: PPO

## 2017-02-02 ENCOUNTER — Ambulatory Visit
Admission: RE | Admit: 2017-02-02 | Discharge: 2017-02-02 | Disposition: A | Discharge status: Home / Self Care | Payer: PPO | Source: Ambulatory Visit | Attending: Cardiovascular Disease | Admitting: Cardiovascular Disease

## 2017-02-02 DIAGNOSIS — I493 Ventricular premature depolarization: Secondary | ICD-10-CM | POA: Diagnosis not present

## 2017-02-02 DIAGNOSIS — Z9889 Other specified postprocedural states: Secondary | ICD-10-CM | POA: Insufficient documentation

## 2017-02-03 ENCOUNTER — Encounter: Payer: Self-pay | Admitting: Cardiovascular Disease

## 2017-02-03 ENCOUNTER — Ambulatory Visit (INDEPENDENT_AMBULATORY_CARE_PROVIDER_SITE_OTHER): Payer: PPO | Admitting: Cardiovascular Disease

## 2017-02-03 VITALS — BP 112/70 | HR 89 | Ht 59.5 in | Wt 125.2 lb

## 2017-02-03 DIAGNOSIS — Z9889 Other specified postprocedural states: Secondary | ICD-10-CM

## 2017-02-03 DIAGNOSIS — I493 Ventricular premature depolarization: Secondary | ICD-10-CM

## 2017-02-03 MED ORDER — METOPROLOL TARTRATE 25 MG PO TABS: 25.0000 mg | ORAL_TABLET | Freq: Two times a day (BID) | ORAL | 3 refills | Status: DC

## 2017-02-03 NOTE — Patient Instructions (Signed)
Medication Instructions:  Your physician has recommended you make the following change in your medication:  START taking metoprolol 25mg  twice daily   Labwork: none  Testing/Procedures: none  Follow-Up: Your physician recommends that you schedule a follow-up appointment in: two months with Dr. Fletcher Anon.    Any Other Special Instructions Will Be Listed Below (If Applicable).     If you need a refill on your cardiac medications before your next appointment, please call your pharmacy.

## 2017-02-03 NOTE — Progress Notes (Signed)
Cardiology Office Note   Date:  02/03/2017   ID:  Nicole Norman, DOB [birth year], MRN [id number]  PCP:  Tracie Harrier, MD  Cardiologist:   Kathlyn Sacramento, MD   Chief Complaint  Patient presents with  . other    Follow up from holter monitor. Pt. c/o dizziness and lightheaded. Meds reviewed by the pt. verbally.       History of Present Illness: Nicole Norman is a 71 y.o. female who presents for a follow-up visit regarding mitral valve prolapse with regurgitation status post minimally invasive mitral valve repair in November 2017. Cardiac catheterization before mitral valve surgery showed no significant coronary artery disease. She has no history of diabetes, hypertension or hyperlipidemia.  She quit smoking in November.  She had previous frequent PVCs. Echocardiogram in December showed an EF of 50-55% with intact mitral valve with a mean gradient of 5 mmHg without regurgitation. The left atrium was mildly dilated. Carotid Doppler in November showed no significant carotid disease.  Metoprolol was discontinued after valve surgery due to improvement in overall symptoms. Since then, she has experienced recurrent episodes of palpitations associated with dizziness, blurred vision and lightheadedness.  She underwent a 48 hour Holter monitor which showed excessive amount of PVCs with a total of 53,000 beats representing 23% burden. There were very short runs of nonsustained ventricular tachycardia. She denies any chest pain or significant dyspnea.  Past Medical History:  Diagnosis Date  . CHF (congestive heart failure) ([place])    a. In setting of severe MR; b. 06/2016 TEE: EF 65-60%.  Marland Kitchen COPD (chronic obstructive pulmonary disease) ([place])    pt. denies  . History of bronchitis   . History of pneumonia   . Incidental pulmonary nodule 06/13/2016   Multiple small ground glass opacities seen on chest CT scan  . Migraine    1x/month - uses Aleve  . Normal coronary arteries      a. cardiac cath 06/19/2016: normal coronary arteries and left dominant system, LVEDP nl, 4+ mitral regurgitation, RHC showed mildly elevated LVEDP (16mmHg), giant V waves noted on PCWP w/ mild pulmonary HTN., pulmonary pressure 39/10 with a mean of 27 mmHg, mildly reduced cardiac output at 3.1 with a cardiac index of 2.09, recommned mitral valve repair, acceptable volume status  . Paroxysmal SVT (supraventricular tachycardia) ([place])   . PVC's (premature ventricular contractions)    a. Holter 2015 with 26,000 PVCs in 48 hours  . 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 07/02/2016 Complex valvuloplasty including quadrangular resection of posterior leaflet, folding plasty, Gore-tex neochord placement x6 and 32 mm Sorin Memo 3D ring annuloplasty with clipping of LA appendage via right mini thoracotomy approach.  . Severe mitral regurgitation    a. TTE 06/05/16: EF 60-65%, nl WM, LV dias fxn nl, mod prolapse of posterior mitral leaflet with mod eccentric regurg, LA mildly dilated, PASP nl; b. TEE 06/18/16: EF 65-70%, LV mildly dilated, no valvular vegetations seen throughout, mitral valve w/ flail motion involving the middle scallop of the post leaflet. Severe regurgitation directed eccentrically, LA mildly dilated  . Tobacco abuse   . Vertigo    no meds    Past Surgical History:  Procedure Laterality Date  . CARDIAC CATHETERIZATION N/A 06/19/2016   Procedure: Right/Left Heart Cath and Coronary Angiography;  Surgeon: Wellington Hampshire, MD;  Location: [facility];  Service: Cardiovascular;  Laterality: N/A;  . CESAREAN SECTION    . CHOLECYSTECTOMY    .  CLIPPING OF ATRIAL APPENDAGE N/A 07/02/2016   Procedure: CLIPPING OF ATRIAL APPENDAGE;  Surgeon: Rexene Alberts, MD;  Location: [facility];  Service: Open Heart Surgery;  Laterality: N/A;  . COLONOSCOPY N/A 12/07/2014   Procedure: COLONOSCOPY;  Surgeon: Lucilla Lame, MD;  Location: [facility];  Service: Gastroenterology;   Laterality: N/A;  . ESOPHAGOGASTRODUODENOSCOPY N/A 12/07/2014   Procedure: ESOPHAGOGASTRODUODENOSCOPY (EGD);  Surgeon: Lucilla Lame, MD;  Location: [facility];  Service: Gastroenterology;  Laterality: N/A;  . EYE SURGERY Bilateral    lasic  . MITRAL VALVE REPAIR Right 07/02/2016   Procedure: MINIMALLY INVASIVE MITRAL VALVE REPAIR (MVR);  Surgeon: Rexene Alberts, MD;  Location: [facility];  Service: Open Heart Surgery;  Laterality: Right;  . TEE WITHOUT CARDIOVERSION N/A 06/18/2016   Procedure: TRANSESOPHAGEAL ECHOCARDIOGRAM (TEE);  Surgeon: Nelva Bush, MD;  Location: [facility];  Service: Cardiovascular;  Laterality: N/A;  . TEE WITHOUT CARDIOVERSION N/A 07/02/2016   Procedure: TRANSESOPHAGEAL ECHOCARDIOGRAM (TEE);  Surgeon: Rexene Alberts, MD;  Location: [facility];  Service: Open Heart Surgery;  Laterality: N/A;  . TONSILLECTOMY       Current Outpatient Prescriptions  Medication Sig Dispense Refill  . acetaminophen (TYLENOL) 325 MG tablet Take 2 tablets (650 mg total) by mouth every 6 (six) hours as needed for mild pain (or Fever >/= 101).    Marland Kitchen aspirin EC 81 MG tablet Take 1 tablet (81 mg total) by mouth daily.    . metoprolol tartrate (LOPRESSOR) 25 MG tablet Take 1 tablet (25 mg total) by mouth 2 (two) times daily. 180 tablet 3   No current facility-administered medications for this visit.     Allergies:   Patient has no known allergies.    Social History:  The patient  reports that she quit smoking about 7 months ago. Her smoking use included Cigarettes. She has a 22.50 pack-year smoking history. She has never used smokeless tobacco. She reports that she drinks alcohol. She reports that she does not use drugs.   Family History:  The patient's family history includes Alzheimer's disease in her mother; Breast cancer (age of onset: 1) in her sister; Cancer in her brother, brother, sister, and sister; Stroke in her father.    ROS:  Please see the history of present illness.    Otherwise, review of systems are positive for none.   All other systems are reviewed and negative.    PHYSICAL EXAM: VS:  BP 112/70 (BP Location: Right Arm, Patient Position: Sitting, Cuff Size: Normal)   Pulse 89   Ht 4' 11.5" (1.511 m)   Wt 125 lb 4 oz (56.8 kg)   BMI 24.87 kg/m  , BMI Body mass index is 24.87 kg/m. GEN: Well nourished, well developed, in no acute distress  HEENT: normal  Neck: no JVD, carotid bruits, or masses Cardiac: RRR ; no  Rubs, murmurs or gallops,no edema . Respiratory:  clear to auscultation bilaterally, normal work of breathing GI: soft, nontender, nondistended, + BS MS: no deformity or atrophy  Skin: warm and dry, no rash Neuro:  Strength and sensation are intact Psych: euthymic mood, full affect   EKG:  EKG is ordered today. EKG showed normal sinus rhythm with very frequent PVCs.   Recent Labs: 06/14/2016: TSH 1.287 06/19/2016: B Natriuretic Peptide 113.3 06/30/2016: ALT 13 07/09/2016: BUN 8; Creatinine, Ser 0.79; Hemoglobin 10.7; Magnesium 1.7; Platelets 210; Potassium 3.5; Sodium 137    Lipid Panel    Component Value Date/Time   CHOL 138 06/16/2016  0410   TRIG 112 06/16/2016 0410   HDL 50 06/16/2016 0410   CHOLHDL 2.8 06/16/2016 0410   VLDL 22 06/16/2016 0410   LDLCALC 66 06/16/2016 0410      Wt Readings from Last 3 Encounters:  02/03/17 125 lb 4 oz (56.8 kg)  10/20/16 120 lb (54.4 kg)  10/09/16 120 lb 12 oz (54.8 kg)       No flowsheet data found.    ASSESSMENT AND PLAN:  1.  Mitral valve disease with mitral valve prolapse: Status post mitral valve repair in November 2017.  She has no longer on anticoagulation.  2. Frequent PVCs:  I suspect that her recent episodes of dizziness, blurred vision and palpitations is related to this. Portal monitor confirmed very high burden of PVCs at 23%. She has responded very well in the past to metoprolol and I elected to resume this at 25 mg twice daily. It might be reasonable to wait on  further workup until we see how she responds to this. She asked if she could drive and I explained to her that given that she did not have any syncopal episodes, she can resume driving if symptoms improve after starting metoprolol.  3. Previous tobacco use:  He quit smoking in November.  4. Postoperative atrial fibrillation: No evidence of recurrent arrhythmia. She is off amiodarone.   Disposition:   FU with me in 2 months.   Signed,  Kathlyn Sacramento, MD  02/03/2017 4:12 PM    [facility]

## 2017-02-09 ENCOUNTER — Ambulatory Visit (HOSPITAL_COMMUNITY): Payer: PPO

## 2017-02-11 ENCOUNTER — Telehealth: Payer: Self-pay | Admitting: Cardiovascular Disease

## 2017-02-11 NOTE — Telephone Encounter (Signed)
S/w patient.  On 02/03/17, Dr Fletcher Anon restarted her on metoprolol 25mg  BID for very frequent PVC's. Patient says she is still having the palpitations often. It happens a couple times a day. She has SOB, feels extremely tired, like heart is racing and some dizziness. She especially feels them at night when she lays down. On 2 occasions, she had tingling in her calf muscles which lasted about 2-3 minutes then went away. Patient wants to know if she should try another medication. Advised I will make Dr Fletcher Anon aware and let her know his recommendations.

## 2017-02-11 NOTE — Telephone Encounter (Signed)
Pt calling stating she is not feeling so well. Since the last time she saw [place].   She was placed on Metoprolol   She states her HR is racing at night, especially when laying down  She had two episodes  where she felt like pins where  in her legs  Not sure if this has to do with Metoprolol   Would like some advise on this

## 2017-02-12 ENCOUNTER — Ambulatory Visit (INDEPENDENT_AMBULATORY_CARE_PROVIDER_SITE_OTHER): Payer: PPO | Admitting: Internal Medicine

## 2017-02-12 ENCOUNTER — Encounter: Payer: Self-pay | Admitting: Internal Medicine

## 2017-02-12 ENCOUNTER — Other Ambulatory Visit
Admission: RE | Admit: 2017-02-12 | Discharge: 2017-02-12 | Disposition: A | Discharge status: Home / Self Care | Payer: PPO | Source: Ambulatory Visit | Attending: Internal Medicine | Admitting: Internal Medicine

## 2017-02-12 VITALS — BP 100/68 | HR 66 | Ht 59.5 in | Wt 122.8 lb

## 2017-02-12 DIAGNOSIS — I493 Ventricular premature depolarization: Secondary | ICD-10-CM | POA: Diagnosis not present

## 2017-02-12 DIAGNOSIS — R5383 Other fatigue: Secondary | ICD-10-CM | POA: Insufficient documentation

## 2017-02-12 MED ORDER — DILTIAZEM HCL ER COATED BEADS 120 MG PO CP24: 120.0000 mg | ORAL_CAPSULE | Freq: Every day | ORAL | 6 refills | Status: DC

## 2017-02-12 NOTE — Progress Notes (Signed)
ELECTROPHYSIOLOGY CONSULT NOTE  Patient ID: Nicole Norman, MRN: [id number], DOB/AGE: April 23, 1946 71 y.o. Admit date: (Not on file) Date of Consult: 02/12/2017  Primary Physician: Tracie Harrier, MD Primary Cardiologist: MA   [name] is being seen today for the evaluation of PVCs at the request of  Tracie Harrier, MD. And Dr MA   HPI Nicole Norman is a 71 y.o. female  Referred for frequent PVCs.       She has a history of mitral valve surgery for mitral regurgitation secondary to prolapse 11/17.  Catheterization 11/17 no obstructive coronary disease Echocardiogram 12/17 EF 50-55% with mild LAE.  Since that time she has never recovered her functional status. She remains fatigued. She has night sweats. She has episodes of weakness. A urinalysis demonstrated blood in her urine.  Because of a history of PVCs underwent Holter monitoring demonstrating 23%  there were episodes of nonsustained VT as well as episodes of nonsustained SVT.  While in the room today she would have episodes of weakness and presyncope. Pulses hard to find.    Past Medical History:  Diagnosis Date  . CHF (congestive heart failure) ([place])    a. In setting of severe MR; b. 06/2016 TEE: EF 65-60%.  Marland Kitchen COPD (chronic obstructive pulmonary disease) ([place])    pt. denies  . History of bronchitis   . History of pneumonia   . Incidental pulmonary nodule 06/13/2016   Multiple small ground glass opacities seen on chest CT scan  . Migraine    1x/month - uses Aleve  . Normal coronary arteries    a. cardiac cath 06/19/2016: normal coronary arteries and left dominant system, LVEDP nl, 4+ mitral regurgitation, RHC showed mildly elevated LVEDP (4mmHg), giant V waves noted on PCWP w/ mild pulmonary HTN., pulmonary pressure 39/10 with a mean of 27 mmHg, mildly reduced cardiac output at 3.1 with a cardiac index of 2.09, recommned mitral valve repair, acceptable volume status  . Paroxysmal  SVT (supraventricular tachycardia) ([place])   . PVC's (premature ventricular contractions)    a. Holter 2015 with 26,000 PVCs in 48 hours  . 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 07/02/2016 Complex valvuloplasty including quadrangular resection of posterior leaflet, folding plasty, Gore-tex neochord placement x6 and 32 mm Sorin Memo 3D ring annuloplasty with clipping of LA appendage via right mini thoracotomy approach.  . Severe mitral regurgitation    a. TTE 06/05/16: EF 60-65%, nl WM, LV dias fxn nl, mod prolapse of posterior mitral leaflet with mod eccentric regurg, LA mildly dilated, PASP nl; b. TEE 06/18/16: EF 65-70%, LV mildly dilated, no valvular vegetations seen throughout, mitral valve w/ flail motion involving the middle scallop of the post leaflet. Severe regurgitation directed eccentrically, LA mildly dilated  . Tobacco abuse   . Vertigo    no meds      Surgical History:  Past Surgical History:  Procedure Laterality Date  . CARDIAC CATHETERIZATION N/A 06/19/2016   Procedure: Right/Left Heart Cath and Coronary Angiography;  Surgeon: Wellington Hampshire, MD;  Location: [facility];  Service: Cardiovascular;  Laterality: N/A;  . CESAREAN SECTION    . CHOLECYSTECTOMY    . CLIPPING OF ATRIAL APPENDAGE N/A 07/02/2016   Procedure: CLIPPING OF ATRIAL APPENDAGE;  Surgeon: Rexene Alberts, MD;  Location: [facility];  Service: Open Heart Surgery;  Laterality: N/A;  . COLONOSCOPY N/A 12/07/2014   Procedure: COLONOSCOPY;  Surgeon: Lucilla Lame, MD;  Location: [facility];  Service: Gastroenterology;  Laterality: N/A;  . ESOPHAGOGASTRODUODENOSCOPY N/A 12/07/2014   Procedure: ESOPHAGOGASTRODUODENOSCOPY (EGD);  Surgeon: Lucilla Lame, MD;  Location: [facility];  Service: Gastroenterology;  Laterality: N/A;  . EYE SURGERY Bilateral    lasic  . MITRAL VALVE REPAIR Right 07/02/2016   Procedure: MINIMALLY INVASIVE MITRAL VALVE REPAIR (MVR);  Surgeon: Rexene Alberts,  MD;  Location: [facility];  Service: Open Heart Surgery;  Laterality: Right;  . TEE WITHOUT CARDIOVERSION N/A 06/18/2016   Procedure: TRANSESOPHAGEAL ECHOCARDIOGRAM (TEE);  Surgeon: Nelva Bush, MD;  Location: [facility];  Service: Cardiovascular;  Laterality: N/A;  . TEE WITHOUT CARDIOVERSION N/A 07/02/2016   Procedure: TRANSESOPHAGEAL ECHOCARDIOGRAM (TEE);  Surgeon: Rexene Alberts, MD;  Location: [facility];  Service: Open Heart Surgery;  Laterality: N/A;  . TONSILLECTOMY       Home Meds: Prior to Admission medications   Medication Sig Start Date End Date Taking? Authorizing Provider  acetaminophen (TYLENOL) 325 MG tablet Take 2 tablets (650 mg total) by mouth every 6 (six) hours as needed for mild pain (or Fever >/= 101). 06/19/16   Loletha Grayer, MD  aspirin EC 81 MG tablet Take 1 tablet (81 mg total) by mouth daily. 10/20/16   Rexene Alberts, MD  calcium-vitamin D (OSCAL WITH D) 500-200 MG-UNIT tablet Take 1 tablet by mouth.    [provider]  cholecalciferol (VITAMIN D) 1000 units tablet Take 5,000 Units by mouth daily.    [provider]  metoprolol tartrate (LOPRESSOR) 25 MG tablet Take 1 tablet (25 mg total) by mouth 2 (two) times daily. 02/03/17 05/04/17  Wellington Hampshire, MD  vitamin B-12 (CYANOCOBALAMIN) 1000 MCG tablet Take 1,000 mcg by mouth daily.    [provider]    Allergies: No Known Allergies  Social History   Social History  . Marital status: Married    Spouse name: N/A  . Number of children: N/A  . Years of education: N/A   Occupational History  . Not on file.   Social History Main Topics  . Smoking status: Former Smoker    Packs/day: 0.50    Years: 45.00    Types: Cigarettes    Quit date: 07/02/2016  . Smokeless tobacco: Never Used     Comment: quit date 06/13/16  . Alcohol use 0.0 oz/week     Comment: rarely  . Drug use: No  . Sexual activity: Not on file   Other Topics Concern  . Not on file   Social History Narrative    . No narrative on file     Family History  Problem Relation Age of Onset  . Stroke Father   . Alzheimer's disease Mother   . Cancer Brother   . Cancer Brother   . Cancer Sister        breast  . Breast cancer Sister 59  . Cancer Sister        breast     ROS:  Please see the history of present illness.     All other systems reviewed and negative.    Physical Exam: Blood pressure 100/68, pulse 66, height 4' 11.5" (1.511 m), weight 122 lb 12 oz (55.7 kg). General: Well developed, well nourished female in no acute distress but looks ill Head: Normocephalic, atraumatic, sclera non-icteric, no xanthomas, nares are without discharge. EENT: normal  Lymph Nodes:  none Neck: Negative for carotid bruits. JVD not elevated. Back:without scoliosis kyphosis Lungs: Clear bilaterally to auscultation without wheezes, rales, or  rhonchi. Breathing is unlabored. Heart: RRR with S1 S2. No 2/6 systolic murmur . No rubs, or gallops appreciated. Abdomen: Soft, non-tender, non-distended with normoactive bowel sounds. No hepatomegaly. No rebound/guarding. No obvious abdominal masses. Msk:  Strength and tone appear normal for age. Extremities: No clubbing or cyanosis. No edema.  Distal pedal pulses are 2+ and equal bilaterally. Skin: Warm and Dry Neuro: Alert and oriented X 3. CN III-XII intact Grossly normal sensory and motor function . Psych:  Responds to questions appropriately with a normal affect.      Labs: Cardiac Enzymes No results for input(s): CKTOTAL, CKMB, TROPONINI in the last 72 hours. CBC Lab Results  Component Value Date   WBC 11.5 (H) 07/09/2016   HGB 10.7 (L) 07/09/2016   HCT 32.5 (L) 07/09/2016   MCV 90.5 07/09/2016   PLT 210 07/09/2016   PROTIME: No results for input(s): LABPROT, INR in the last 72 hours. Chemistry No results for input(s): NA, K, CL, CO2, BUN, CREATININE, CALCIUM, PROT, BILITOT, ALKPHOS, ALT, AST, GLUCOSE in the last 168 hours.  Invalid input(s):  LABALBU Lipids Lab Results  Component Value Date   CHOL 138 06/16/2016   HDL 50 06/16/2016   LDLCALC 66 06/16/2016   TRIG 112 06/16/2016   BNP No results found for: PROBNP Thyroid Function Tests: No results for input(s): TSH, T4TOTAL, T3FREE, THYROIDAB in the last 72 hours.  Invalid input(s): FREET3 Miscellaneous No results found for: DDIMER  Radiology/Studies:  No results found.  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 This speaks to his episodic nature and makes me wonder whether some of her presyncopal events are associated with the SVT that was seen or nonsustained VT that was seen.  To clarify this, would recommend the use of a event recorder.  Her lassitude and weakness are concerning, particularly in the context of hematuria noted on UA and the night sweats which she dates back to her surgery. We will obtain blood cultures to make sure there is no evidence of endocarditis  I wonder also whether her beta blockers may not be further contributing and so we will change the beta blocker to a calcium blocker.  [place] VT with this type of morphology is often verapamil sensitive; however, her constipation precludes that end we will use diltiazem   Virl Axe

## 2017-02-12 NOTE — Patient Instructions (Signed)
Medication Instructions: - Your physician has recommended you make the following change in your medication:  1) Stop metoprolol 2) Start Diltiazem 120 mg- take 1 tablet by mouth once daily  Labwork: - Your physician recommends that you have lab work today: blood cultures x 2   Procedures/Testing: - Your physician has requested that you have an echocardiogram. Echocardiography is a painless test that uses sound waves to create images of your heart. It provides your doctor with information about the size and shape of your heart and how well your heart's chambers and valves are working. This procedure takes approximately one hour. There are no restrictions for this procedure.  - Your physician has recommended that you wear an event monitor. Event monitors are medical devices that record the heart's electrical activity. Doctors most often [place] these monitors to diagnose arrhythmias. Arrhythmias are problems with the speed or rhythm of the heartbeat. The monitor is a small, portable device. You can wear one while you do your normal daily activities. This is usually used to diagnose what is causing palpitations/syncope (passing out).  Follow-Up: - Your physician recommends that you schedule a follow-up appointment in: 6 weeks with Dr. Caryl Comes.   Any Additional Special Instructions Will Be Listed Below (If Applicable).     If you need a refill on your cardiac medications before your next appointment, please call your pharmacy.

## 2017-02-12 NOTE — Telephone Encounter (Signed)
Pt agreeable to appt today at 11:30am with Dr. Caryl Comes.

## 2017-02-12 NOTE — Telephone Encounter (Signed)
Lets have her see Dr. Lequita Halt within a week.

## 2017-02-17 LAB — CULTURE, BLOOD (SINGLE)
CULTURE: NO GROWTH
CULTURE: NO GROWTH

## 2017-02-18 ENCOUNTER — Telehealth: Payer: Self-pay | Admitting: Internal Medicine

## 2017-02-18 ENCOUNTER — Ambulatory Visit (INDEPENDENT_AMBULATORY_CARE_PROVIDER_SITE_OTHER): Payer: PPO

## 2017-02-18 DIAGNOSIS — R002 Palpitations: Secondary | ICD-10-CM | POA: Diagnosis not present

## 2017-02-18 DIAGNOSIS — I493 Ventricular premature depolarization: Secondary | ICD-10-CM | POA: Diagnosis not present

## 2017-02-18 NOTE — Telephone Encounter (Signed)
Patient needs help with making sure preventice monitor is on right patient in lobby

## 2017-02-23 ENCOUNTER — Telehealth: Payer: Self-pay | Admitting: *Deleted

## 2017-02-23 NOTE — Telephone Encounter (Signed)
Received monitor tracings:  02/22/17   05:37:19 pm (CT) 02/20/17   08:24:12 AM(CT) 02/19/17   08:59:38 PM (CT) 02/19/17  06:14:02 PM (CT) 02/18/17 08:44:45 PM(CT)  These tracings were reviewed by Dr Smith--Frequent V ectopy, no sustained V arrhythmia, continue to monitor, no new recommendations.   02/23/17--pt aware Dr Tamala Julian reviewed tracings, no new recommendations, tracings to Medical Records to be scanned in Epic.

## 2017-02-26 ENCOUNTER — Telehealth: Payer: Self-pay | Admitting: Internal Medicine

## 2017-02-26 NOTE — Telephone Encounter (Signed)
Patient states that she is having problems with the monitor staying charged. She called the company and they sent her a new cradle but it continues to not hold a charge good. Provided her with customer service number with monitor company so that they can help try to figure out what is causing this problem. She was very frustrated with the inconvenience of this monitor and advised her that the results are truly helpful in determining what is going on. Provided her with number to company to call them back regarding any technical problems. She was very appreciative for the call and had no further questions at this time.

## 2017-02-26 NOTE — Telephone Encounter (Signed)
I called and spoke with the patient. She states she called the company back and they are sending her a whole new system tomorrow.  I advised her that Dr. Caryl Comes has been receiving some tracings on her that show PVC's, but he wanted to make sure that any symptoms that she has to please hit the button to record. She is agreeable and will continue to do this once new monitor is received.  She was also advised of negative blood cultures.

## 2017-02-26 NOTE — Telephone Encounter (Signed)
Pt came by to discuss moniters Says it will not stay charged  Continues to get low battery message, early on in the day when it use to last til about 7pm Please call to discuss

## 2017-02-27 ENCOUNTER — Encounter: Payer: Self-pay | Admitting: Internal Medicine

## 2017-02-28 ENCOUNTER — Encounter: Payer: Self-pay | Admitting: Internal Medicine

## 2017-03-02 ENCOUNTER — Telehealth: Payer: Self-pay | Admitting: *Deleted

## 2017-03-02 NOTE — Telephone Encounter (Signed)
Lm to call back re serious monitor findings .Analysis as follows from 02-27-17 sinus tachycardia with run of v-tach (5 ,4 beats ) pvc's (10 in 1 min.)

## 2017-03-02 NOTE — Telephone Encounter (Signed)
New message     If you dont reach her on her hm number try her work number , returning call

## 2017-03-02 NOTE — Telephone Encounter (Signed)
Per pt can feel heart beating fast. Pt is currently taking the Diltiazem 120 mg .Will forward to Dr Caryl Comes for review .Adonis Housekeeper

## 2017-03-03 ENCOUNTER — Encounter: Payer: Self-pay | Admitting: Internal Medicine

## 2017-03-03 NOTE — Telephone Encounter (Signed)
We may have to try something else, lets try flecainide 50 bid

## 2017-03-04 NOTE — Telephone Encounter (Signed)
Spoke with pt has not had anymore epsisodes since talking with pt on Monday.Pt would rather not start new med as pt is leaving on August 7 for vacation ,but if Dr Caryl Comes wants  Change med pt is agreeable .Will forward to Dr Caryl Comes for review./cy

## 2017-03-05 MED ORDER — FLECAINIDE ACETATE 100 MG PO TABS: 100.0000 mg | ORAL_TABLET | Freq: Two times a day (BID) | ORAL | 6 refills | Status: DC

## 2017-03-05 NOTE — Addendum Note (Signed)
Addended by: Alvis Lemmings C on: 03/05/2017 10:38 AM   Modules accepted: Orders

## 2017-03-05 NOTE — Telephone Encounter (Signed)
Reviewed with Dr. Caryl Comes- ok if patient wants to wait to start flecainide, but since she has about a week before she goes, then it may be a good idea to go ahead and try it.   I have called and notified the patient of this.  She is agreeable with starting flecainide- Per Dr. Caryl Comes, change the order from flecainide 50 mg BID to 100 mg BID.  The patient is aware that she should continue diltiazem per Dr. Caryl Comes.

## 2017-03-06 ENCOUNTER — Other Ambulatory Visit: Payer: Self-pay

## 2017-03-06 ENCOUNTER — Ambulatory Visit (INDEPENDENT_AMBULATORY_CARE_PROVIDER_SITE_OTHER): Payer: PPO

## 2017-03-06 DIAGNOSIS — I493 Ventricular premature depolarization: Secondary | ICD-10-CM | POA: Diagnosis not present

## 2017-03-06 DIAGNOSIS — Z952 Presence of prosthetic heart valve: Secondary | ICD-10-CM

## 2017-03-16 ENCOUNTER — Telehealth: Payer: Self-pay

## 2017-03-16 NOTE — Telephone Encounter (Signed)
Pt is aware and agreeable to echo results. Patient states she is wearing a cardiac event monitor which is scheduled to come off 03/18/17. She was very grateful for the call and pleased with the results.

## 2017-04-09 ENCOUNTER — Ambulatory Visit (INDEPENDENT_AMBULATORY_CARE_PROVIDER_SITE_OTHER): Payer: PPO | Admitting: Internal Medicine

## 2017-04-09 ENCOUNTER — Encounter: Payer: Self-pay | Admitting: Internal Medicine

## 2017-04-09 VITALS — BP 134/76 | HR 83 | Ht 59.0 in | Wt 129.5 lb

## 2017-04-09 DIAGNOSIS — I493 Ventricular premature depolarization: Secondary | ICD-10-CM

## 2017-04-09 DIAGNOSIS — I471 Supraventricular tachycardia: Secondary | ICD-10-CM

## 2017-04-09 NOTE — Progress Notes (Signed)
Patient Care Team: Tracie Harrier, MD as PCP - General (Internal Medicine)   HPI  Nicole Norman is a 71 y.o. female Seen in followup for VT and PVCs in the context of prior Mitral valve repair ( 07/02/2016 Complex valvuloplasty including quadrangular resection of posterior leaflet, folding plasty, Gore-tex neochord placement x6 and 32 mm Sorin Memo 3D ring annuloplasty with clipping of LA appendage  )  for severe MR.  At the last visit, because of fatigue, we switched her beta blockers to calcium blockers.  She is also having systemic symptoms of sweats and hematuria, blood cultures were obtained and were negative.  She was started on flecainide and with that she noted a significant reduction in her palpitations and fatigue. Exercise tolerance is improved. And concomitant with that the event recorder noted a significant decrease in PVC burden from about 20%--1%  ths monitor was reviewed today.  EF 11/17 60-65%   Records and Results Reviewed   Past Medical History:  Diagnosis Date  . CHF (congestive heart failure) ([place])    a. In setting of severe MR; b. 06/2016 TEE: EF 65-60%.  Marland Kitchen COPD (chronic obstructive pulmonary disease) ([place])    pt. denies  . History of bronchitis   . History of pneumonia   . Incidental pulmonary nodule 06/13/2016   Multiple small ground glass opacities seen on chest CT scan  . Migraine    1x/month - uses Aleve  . Normal coronary arteries    a. cardiac cath 06/19/2016: normal coronary arteries and left dominant system, LVEDP nl, 4+ mitral regurgitation, RHC showed mildly elevated LVEDP (73mmHg), giant V waves noted on PCWP w/ mild pulmonary HTN., pulmonary pressure 39/10 with a mean of 27 mmHg, mildly reduced cardiac output at 3.1 with a cardiac index of 2.09, recommned mitral valve repair, acceptable volume status  . Paroxysmal SVT (supraventricular tachycardia) ([place])   . PVC's (premature ventricular contractions)    a. Holter 2015 with  26,000 PVCs in 48 hours  . 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 07/02/2016 Complex valvuloplasty including quadrangular resection of posterior leaflet, folding plasty, Gore-tex neochord placement x6 and 32 mm Sorin Memo 3D ring annuloplasty with clipping of LA appendage via right mini thoracotomy approach.  . Severe mitral regurgitation    a. TTE 06/05/16: EF 60-65%, nl WM, LV dias fxn nl, mod prolapse of posterior mitral leaflet with mod eccentric regurg, LA mildly dilated, PASP nl; b. TEE 06/18/16: EF 65-70%, LV mildly dilated, no valvular vegetations seen throughout, mitral valve w/ flail motion involving the middle scallop of the post leaflet. Severe regurgitation directed eccentrically, LA mildly dilated  . Tobacco abuse   . Vertigo    no meds    Past Surgical History:  Procedure Laterality Date  . CARDIAC CATHETERIZATION N/A 06/19/2016   Procedure: Right/Left Heart Cath and Coronary Angiography;  Surgeon: Wellington Hampshire, MD;  Location: [facility];  Service: Cardiovascular;  Laterality: N/A;  . CESAREAN SECTION    . CHOLECYSTECTOMY    . CLIPPING OF ATRIAL APPENDAGE N/A 07/02/2016   Procedure: CLIPPING OF ATRIAL APPENDAGE;  Surgeon: Rexene Alberts, MD;  Location: [facility];  Service: Open Heart Surgery;  Laterality: N/A;  . COLONOSCOPY N/A 12/07/2014   Procedure: COLONOSCOPY;  Surgeon: Lucilla Lame, MD;  Location: [facility];  Service: Gastroenterology;  Laterality: N/A;  . ESOPHAGOGASTRODUODENOSCOPY N/A 12/07/2014   Procedure: ESOPHAGOGASTRODUODENOSCOPY (EGD);  Surgeon: Lucilla Lame, MD;  Location: [facility];  Service: Gastroenterology;  Laterality: N/A;  . EYE SURGERY Bilateral    lasic  . MITRAL VALVE REPAIR Right 07/02/2016   Procedure: MINIMALLY INVASIVE MITRAL VALVE REPAIR (MVR);  Surgeon: Rexene Alberts, MD;  Location: [facility];  Service: Open Heart Surgery;  Laterality: Right;  . TEE WITHOUT CARDIOVERSION N/A 06/18/2016   Procedure:  TRANSESOPHAGEAL ECHOCARDIOGRAM (TEE);  Surgeon: Nelva Bush, MD;  Location: [facility];  Service: Cardiovascular;  Laterality: N/A;  . TEE WITHOUT CARDIOVERSION N/A 07/02/2016   Procedure: TRANSESOPHAGEAL ECHOCARDIOGRAM (TEE);  Surgeon: Rexene Alberts, MD;  Location: [facility];  Service: Open Heart Surgery;  Laterality: N/A;  . TONSILLECTOMY      Current Outpatient Prescriptions  Medication Sig Dispense Refill  . acetaminophen (TYLENOL) 325 MG tablet Take 2 tablets (650 mg total) by mouth every 6 (six) hours as needed for mild pain (or Fever >/= 101).    Marland Kitchen aspirin EC 81 MG tablet Take 1 tablet (81 mg total) by mouth daily.    . calcium-vitamin D (OSCAL WITH D) 500-200 MG-UNIT tablet Take 1 tablet by mouth.    . cholecalciferol (VITAMIN D) 1000 units tablet Take 5,000 Units by mouth daily.    Marland Kitchen diltiazem (CARDIZEM CD) 120 MG 24 hr capsule Take 1 capsule (120 mg total) by mouth daily. 30 capsule 6  . flecainide (TAMBOCOR) 100 MG tablet Take 1 tablet (100 mg total) by mouth 2 (two) times daily. 60 tablet 6  . vitamin B-12 (CYANOCOBALAMIN) 1000 MCG tablet Take 1,000 mcg by mouth daily.     No current facility-administered medications for this visit.     No Known Allergies    Review of Systems negative except from HPI and PMH  Physical Exam BP 134/76 (BP Location: Left Arm, Patient Position: Sitting, Cuff Size: Normal)   Pulse 83   Ht 4\' 11"  (1.499 m)   Wt 129 lb 8 oz (58.7 kg)   SpO2 97%   BMI 26.16 kg/m  Well developed and well nourished in no acute distress HENT normal E scleral and icterus clear Neck Supple JVP flat; carotids brisk and full Clear to ausculation  Regular rate and rhythm, no murmurs gallops or rub Soft with active bowel sounds No clubbing cyanosis } Edema Alert and oriented, grossly normal motor and sensory function Skin Warm and Dry  ECG withouth PVC  Assessment and  Plan PVCs-frequent right bundle branch superior axis morphology/nonsustained  VT  Mitral valve repair 12/17  SVT   The pts PVC have been largely extinguished by the flecainide which she is tolerating well   Her QRS duration is within range  She was quite frustrated by my being late  She thought her appt was 10 not 1015, and I explained to her that unfortunately, it is my wont to be late as pts take more time than is alloted, and it takes time to listen and understand and address concerns  I suggested if she would prefer a more timely Physician, I would be glad to help her find one;  I would also be glad to help continue to care for her but her expectations of me needed to include the limitations above. For now, she said she would be inclined to followup with me      Current medicines are reviewed at length with the patient today .  The patient does not have concerns regarding medicines.

## 2017-04-09 NOTE — Patient Instructions (Signed)

## 2017-04-16 ENCOUNTER — Ambulatory Visit: Payer: PPO | Admitting: Cardiovascular Disease

## 2017-07-20 ENCOUNTER — Ambulatory Visit: Payer: PPO | Admitting: Thoracic Surgery (Cardiothoracic Vascular Surgery)

## 2017-07-27 ENCOUNTER — Ambulatory Visit: Payer: PPO | Admitting: Thoracic Surgery (Cardiothoracic Vascular Surgery)

## 2017-08-10 ENCOUNTER — Encounter: Payer: Self-pay | Admitting: Thoracic Surgery (Cardiothoracic Vascular Surgery)

## 2017-08-10 ENCOUNTER — Ambulatory Visit: Payer: PPO | Admitting: Thoracic Surgery (Cardiothoracic Vascular Surgery)

## 2017-08-10 VITALS — BP 110/76 | HR 73 | Resp 20 | Ht 59.0 in | Wt 133.0 lb

## 2017-08-10 DIAGNOSIS — Z9889 Other specified postprocedural states: Secondary | ICD-10-CM | POA: Diagnosis not present

## 2017-08-10 DIAGNOSIS — I340 Nonrheumatic mitral (valve) insufficiency: Secondary | ICD-10-CM

## 2017-08-10 NOTE — Patient Instructions (Signed)

## 2017-08-10 NOTE — Progress Notes (Signed)
[address]       [place] [address]             [phone]     CARDIOTHORACIC SURGERY OFFICE NOTE  Referring Provider is Wellington Hampshire, MD PCP is Tracie Harrier, MD   HPI:  Patient is a 72 year old female with history of mitral valve prolapse with severe symptomatic primary mitral regurgitation, long-standing tobacco abuse and COPD who returns to the office today for routine follow-up status post minimally invasive mitral valve repair on 07/02/2016.  She reports that she is doing remarkably well.  She has been seen in follow-up on several occasions both by Dr. Fletcher Anon and Dr. Caryl Comes.  Early after surgery she had problems with palpitations related to frequent PVCs.  This has resolved since she was placed on flecainide.  She reports that she has normal physical activity with no restrictions whatsoever.  She states that she only gets short of breath if she really pushes herself hard physically.  Overall she is delighted with her result.  She states that she feels dramatically better than she did prior to surgery.   Current Outpatient Medications  Medication Sig Dispense Refill  . acetaminophen (TYLENOL) 325 MG tablet Take 2 tablets (650 mg total) by mouth every 6 (six) hours as needed for mild pain (or Fever >/= 101).    Marland Kitchen aspirin EC 81 MG tablet Take 1 tablet (81 mg total) by mouth daily.    . calcium-vitamin D (OSCAL WITH D) 500-200 MG-UNIT tablet Take 1 tablet by mouth.    . cholecalciferol (VITAMIN D) 1000 units tablet Take 5,000 Units by mouth daily.    Marland Kitchen diltiazem (CARDIZEM CD) 120 MG 24 hr capsule Take 1 capsule (120 mg total) by mouth daily. 30 capsule 6  . flecainide (TAMBOCOR) 100 MG tablet Take 1 tablet (100 mg total) by mouth 2 (two) times daily. 60 tablet 6  . vitamin B-12 (CYANOCOBALAMIN) 1000 MCG tablet Take 1,000 mcg by mouth daily.     No current facility-administered medications for this visit.       Physical Exam:   BP 110/76   Pulse  73   Resp 20   Ht 4\' 11"  (1.499 m)   Wt 133 lb (60.3 kg)   SpO2 98% Comment: RA  BMI 26.86 kg/m   General:  Well-appearing  Chest:   Clear to auscultation  CV:   Regular rate and rhythm without murmur  Incisions:  Completely healed  Abdomen:  Soft nontender  Extremities:  Warm and well perfused  Diagnostic Tests:  Transthoracic Echocardiography  Patient:    Nicole Norman, Nicole Norman MR #:       [id number] Study Date: 03/06/2017 Gender:     F Age:        41 Height:     151.1 cm Weight:     55.7 kg BSA:        1.54 m^2 Pt. Status: Room:   ATTENDING    Idelle Jo, Remo Lipps  REFERRING    Klein, [place]  SONOGRAPHER  Pilar Jarvis, RVT, RDCS, RDMS  cc:  ------------------------------------------------------------------- LV EF: 50% -   55%  ------------------------------------------------------------------- History:   PMH:  H/O PVC&'s, mitral valve repair.  Dyspnea. Coronary artery disease.  Mitral valve disease.  Primary pulmonary hypertension.  Risk factors:  Former tobacco use.  ------------------------------------------------------------------- Study Conclusions  - Left ventricle: The cavity size was  normal. Systolic function was   normal. The estimated ejection fraction was in the range of 50%   to 55%. Wall motion was normal; there were no regional wall   motion abnormalities. Left ventricular diastolic function   parameters were normal. - Mitral valve: Prior procedures included surgical repair. Mobility   was not restricted. There was trivial regurgitation. Mean   gradient (D): 4 mm Hg. - Left atrium: The atrium was normal in size. - Right ventricle: Systolic function was normal. - Pulmonary arteries: Systolic pressure was within the normal   range.  ------------------------------------------------------------------- Labs, prior tests, procedures, and surgery: ECG.      Abnormal.  ------------------------------------------------------------------- Study data:  The previous study was not available, so comparison was made to the report of December 2017.  Study status:  Routine. Procedure:  Transthoracic echocardiography. Image quality was fair. The study was technically difficult, as a result of poor acoustic windows.  Study completion:  There were no complications. Transthoracic echocardiography.  M-mode, complete 2D, spectral Doppler, and color Doppler.  Birthdate:  Patient birthdate: Nov 28, 1945.  Age:  Patient is 72 yr old.  Sex:  Gender: female. BMI: 24.4 kg/m^2.  Blood pressure:     110/70  Patient status: Outpatient.  Study date:  Study date: 03/06/2017. Study time: 08:05 AM.  -------------------------------------------------------------------  ------------------------------------------------------------------- Left ventricle:  The cavity size was normal. Systolic function was normal. The estimated ejection fraction was in the range of 50% to 55%. Wall motion was normal; there were no regional wall motion abnormalities. The transmitral flow pattern was normal. The deceleration time of the early transmitral flow velocity was normal. The pulmonary vein flow pattern was normal. The tissue Doppler parameters were normal. Left ventricular diastolic function parameters were normal.  ------------------------------------------------------------------- Aortic valve:   Trileaflet; normal thickness leaflets. Mobility was not restricted.  Doppler:  Transvalvular velocity was within the normal range. There was no stenosis. There was no regurgitation. VTI ratio of LVOT to aortic valve: 0.88. Valve area (VTI): 2.5 cm^2. Indexed valve area (VTI): 1.62 cm^2/m^2. Mean velocity ratio of LVOT to aortic valve: 0.85. Valve area (Vmean): 2.42 cm^2. Indexed valve area (Vmean): 1.57 cm^2/m^2.    Mean gradient (S): 1 mm  Hg.  ------------------------------------------------------------------- Aorta:  Aortic root: The aortic root was normal in size.  ------------------------------------------------------------------- Mitral valve:  Poorly visualized. Prior procedures included surgical repair. Mobility was not restricted.  Doppler: Transvalvular velocity was within the normal range. There was no evidence for stenosis. There was trivial regurgitation.    Valve area by pressure half-time: 8.46 cm^2. Indexed valve area by pressure half-time: 5.49 cm^2/m^2. Valve area by continuity equation (using LVOT flow): 1.02 cm^2. Indexed valve area by continuity equation (using LVOT flow): 0.66 cm^2/m^2.    Mean gradient (D): 4 mm Hg. Peak gradient (D): 5 mm Hg.  ------------------------------------------------------------------- Left atrium:  The atrium was normal in size.  ------------------------------------------------------------------- Right ventricle:  The cavity size was normal. Wall thickness was normal. Systolic function was normal.  ------------------------------------------------------------------- Pulmonic valve:    Structurally normal valve.   Cusp separation was normal.  Doppler:  Transvalvular velocity was within the normal range. There was no evidence for stenosis. There was no regurgitation.  ------------------------------------------------------------------- Tricuspid valve:   Structurally normal valve.    Doppler: Transvalvular velocity was within the normal range. There was no regurgitation.  ------------------------------------------------------------------- Pulmonary artery:   The main pulmonary artery was normal-sized. Systolic pressure was within the normal range.  ------------------------------------------------------------------- Right atrium:  The atrium was normal in size.  -------------------------------------------------------------------  Pericardium:  There was no  pericardial effusion.  ------------------------------------------------------------------- Systemic veins: Inferior vena cava: The vessel was normal in size. The respirophasic diameter changes were in the normal range (>= 50%), consistent with normal central venous pressure.  ------------------------------------------------------------------- Measurements   Left ventricle                           Value          Reference  LV ID, ED, PLAX chordal          (L)     42    mm       43 - 52  LV ID, ES, PLAX chordal                  32    mm       23 - 38  LV fx shortening, PLAX chordal   (L)     24    %        >=29  LV PW thickness, ED                      9     mm       ----------  IVS/LV PW ratio, ED                      0.89           <=1.3  Stroke volume, 2D                        33    ml       ----------  Stroke volume/bsa, 2D                    21    ml/m^2   ----------  LV e&', lateral                           4.35  cm/s     ----------  LV E/e&', lateral                         26.21          ----------  LV e&', medial                            6.42  cm/s     ----------  LV E/e&', medial                          17.76          ----------  LV e&', average                           5.39  cm/s     ----------  LV E/e&', average                         21.17          ----------    Ventricular septum                       Value          Reference  IVS thickness,  ED                        8     mm       ----------    LVOT                                     Value          Reference  LVOT ID, S                               19    mm       ----------  LVOT area                                2.84  cm^2     ----------  LVOT ID                                  19    mm       ----------  LVOT mean velocity, S                    47.8  cm/s     ----------  LVOT VTI, S                              11.7  cm       ----------  Stroke volume (SV), LVOT DP              33.2  ml        ----------  Stroke index (SV/bsa), LVOT DP           21.5  ml/m^2   ----------    Aortic valve                             Value          Reference  Aortic valve mean velocity, S            56.2  cm/s     ----------  Aortic valve VTI, S                      13.3  cm       ----------  Aortic mean gradient, S                  1     mm Hg    ----------  VTI ratio, LVOT/AV                       0.88           ----------  Aortic valve area, VTI                   2.5   cm^2     ----------  Aortic valve area/bsa, VTI               1.62  cm^2/m^2 ----------  Velocity ratio, mean, LVOT/AV            0.85           ----------  Aortic valve area, mean velocity         2.42  cm^2     ----------  Aortic valve area/bsa, mean              1.57  cm^2/m^2 ----------  velocity    Aorta                                    Value          Reference  Aortic root ID, ED                       30    mm       ----------  Ascending aorta ID, A-P, S               29    mm       ----------    Left atrium                              Value          Reference  LA ID, A-P, ES                           27    mm       ----------  LA ID/bsa, A-P                           1.75  cm/m^2   <=2.2  LA volume, S                             35.8  ml       ----------  LA volume/bsa, S                         23.2  ml/m^2   ----------  LA volume, ES, 1-p A4C                   34.6  ml       ----------  LA volume/bsa, ES, 1-p A4C               22.5  ml/m^2   ----------  LA volume, ES, 1-p A2C                   37.7  ml       ----------  LA volume/bsa, ES, 1-p A2C               24.5  ml/m^2   ----------    Mitral valve                             Value          Reference  Mitral E-wave peak velocity              114   cm/s     ----------  Mitral A-wave peak velocity              124   cm/s     ----------  Mitral mean velocity, D  86.9  cm/s     ----------  Mitral deceleration time         (L)     87    ms       150  - 230  Mitral pressure half-time                26    ms       ----------  Mitral mean gradient, D                  4     mm Hg    ----------  Mitral peak gradient, D                  5     mm Hg    ----------  Mitral E/A ratio, peak                   0.9            ----------  Mitral valve area, PHT, DP               8.46  cm^2     ----------  Mitral valve area/bsa, PHT, DP           5.49  cm^2/m^2 ----------  Mitral valve area, LVOT                  1.02  cm^2     ----------  continuity  Mitral valve area/bsa, LVOT              0.66  cm^2/m^2 ----------  continuity  Mitral annulus VTI, D                    32.5  cm       ----------    Right atrium                             Value          Reference  RA ID, S-I, ES, A4C                      35.3  mm       34 - 49  RA area, ES, A4C                         9.48  cm^2     8.3 - 19.5  RA volume, ES, A/L                       20.2  ml       ----------  RA volume/bsa, ES, A/L                   13.1  ml/m^2   ----------    Right ventricle                          Value          Reference  RV ID, minor axis, ED, A4C base          29    mm       ----------  TAPSE  21.2  mm       ----------  RV s&', lateral, S                        12    cm/s     ----------    Pulmonic valve                           Value          Reference  Pulmonic valve peak velocity, S          47.1  cm/s     ----------  Legend: (L)  and  (H)  mark values outside specified reference range.  ------------------------------------------------------------------- Prepared and Electronically Authenticated by  Esmond Plants, MD, [facility] 2018-08-03T16:57:58   Impression:  Patient is doing very well more than 1 year status post minimally invasive mitral valve repair.  She reports normal exercise tolerance with no significant exertional shortness of breath.  Late follow-up echocardiogram looks good with normal left ventricular function and  intact mitral valve repair.  Plan:  We have not recommended any changes to the patient's current medications.  She will continue to follow-up intermittently with Dr. Fletcher Anon and Dr. Caryl Comes.  In the future she will call and return to see [place] here only should specific problems or questions arise.  The patient has been reminded regarding the importance of dental hygiene and the lifelong need for antibiotic prophylaxis for all dental cleanings and other related invasive procedures.  I spent in excess of 15 minutes during the conduct of this office consultation and >50% of this time involved direct face-to-face encounter with the patient for counseling and/or coordination of their care.    Valentina Gu. Roxy Manns, MD 08/10/2017 10:27 AM

## 2017-09-10 IMAGING — CR DG CHEST 2V
2 series · 2 of 2 positions shown · non-contrast
Comparison: June 19, 2016

CLINICAL DATA: Preoperative testing.

EXAM:
CHEST  2 VIEW

[w chest pa]
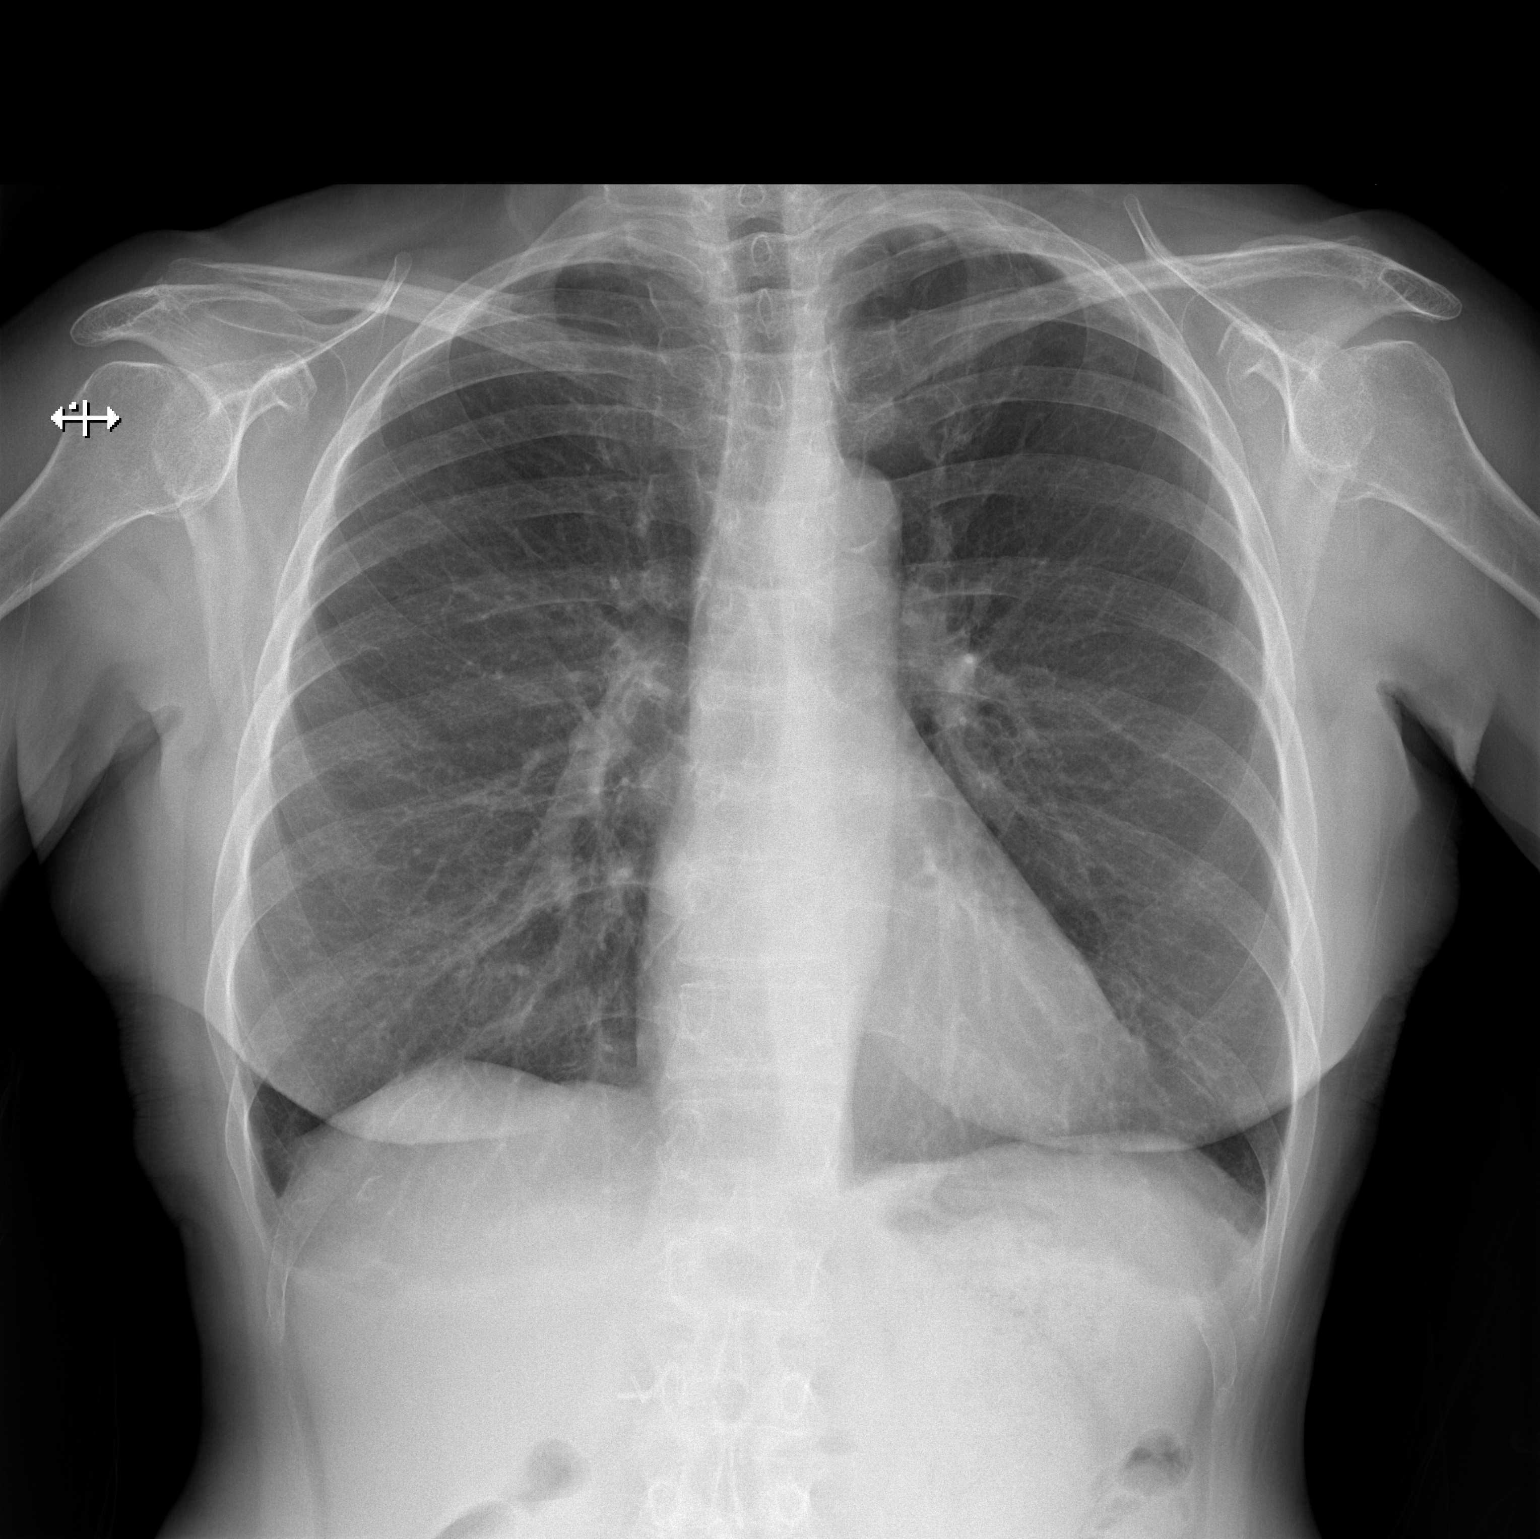

[w chest lat]
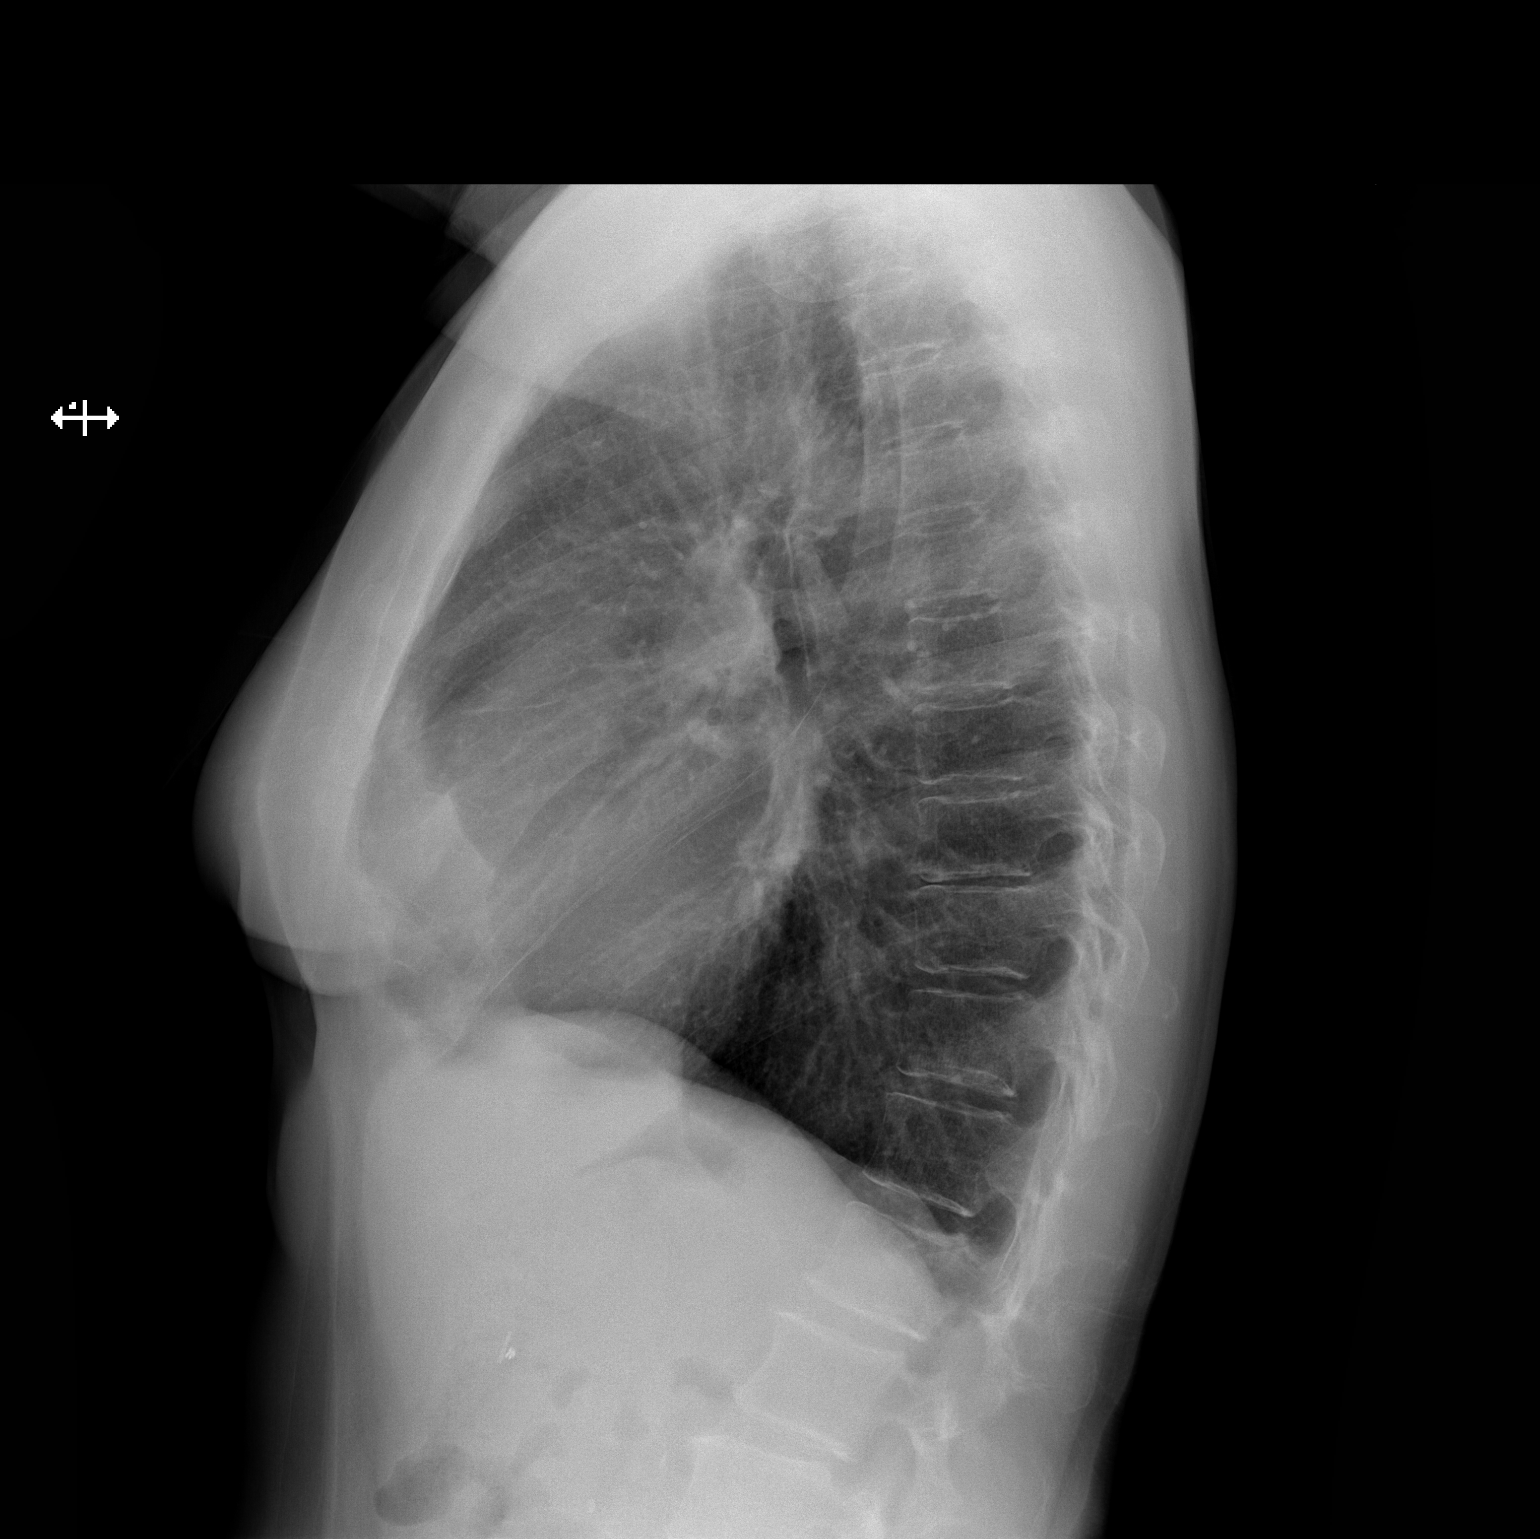

[2 of 2 positions shown; findings below may reference images not displayed]

FINDINGS: The heart size and mediastinal contours are within normal limits.
Both lungs are clear. The visualized skeletal structures are
unremarkable.
IMPRESSION: No active cardiopulmonary disease.

## 2017-09-12 IMAGING — CR DG CHEST 1V PORT
1 series · 1 of 1 positions shown · non-contrast
Comparison: 06/30/2016

CLINICAL DATA: Open heart surgery.  Atelectasis.

EXAM:
PORTABLE CHEST 1 VIEW

[AP]
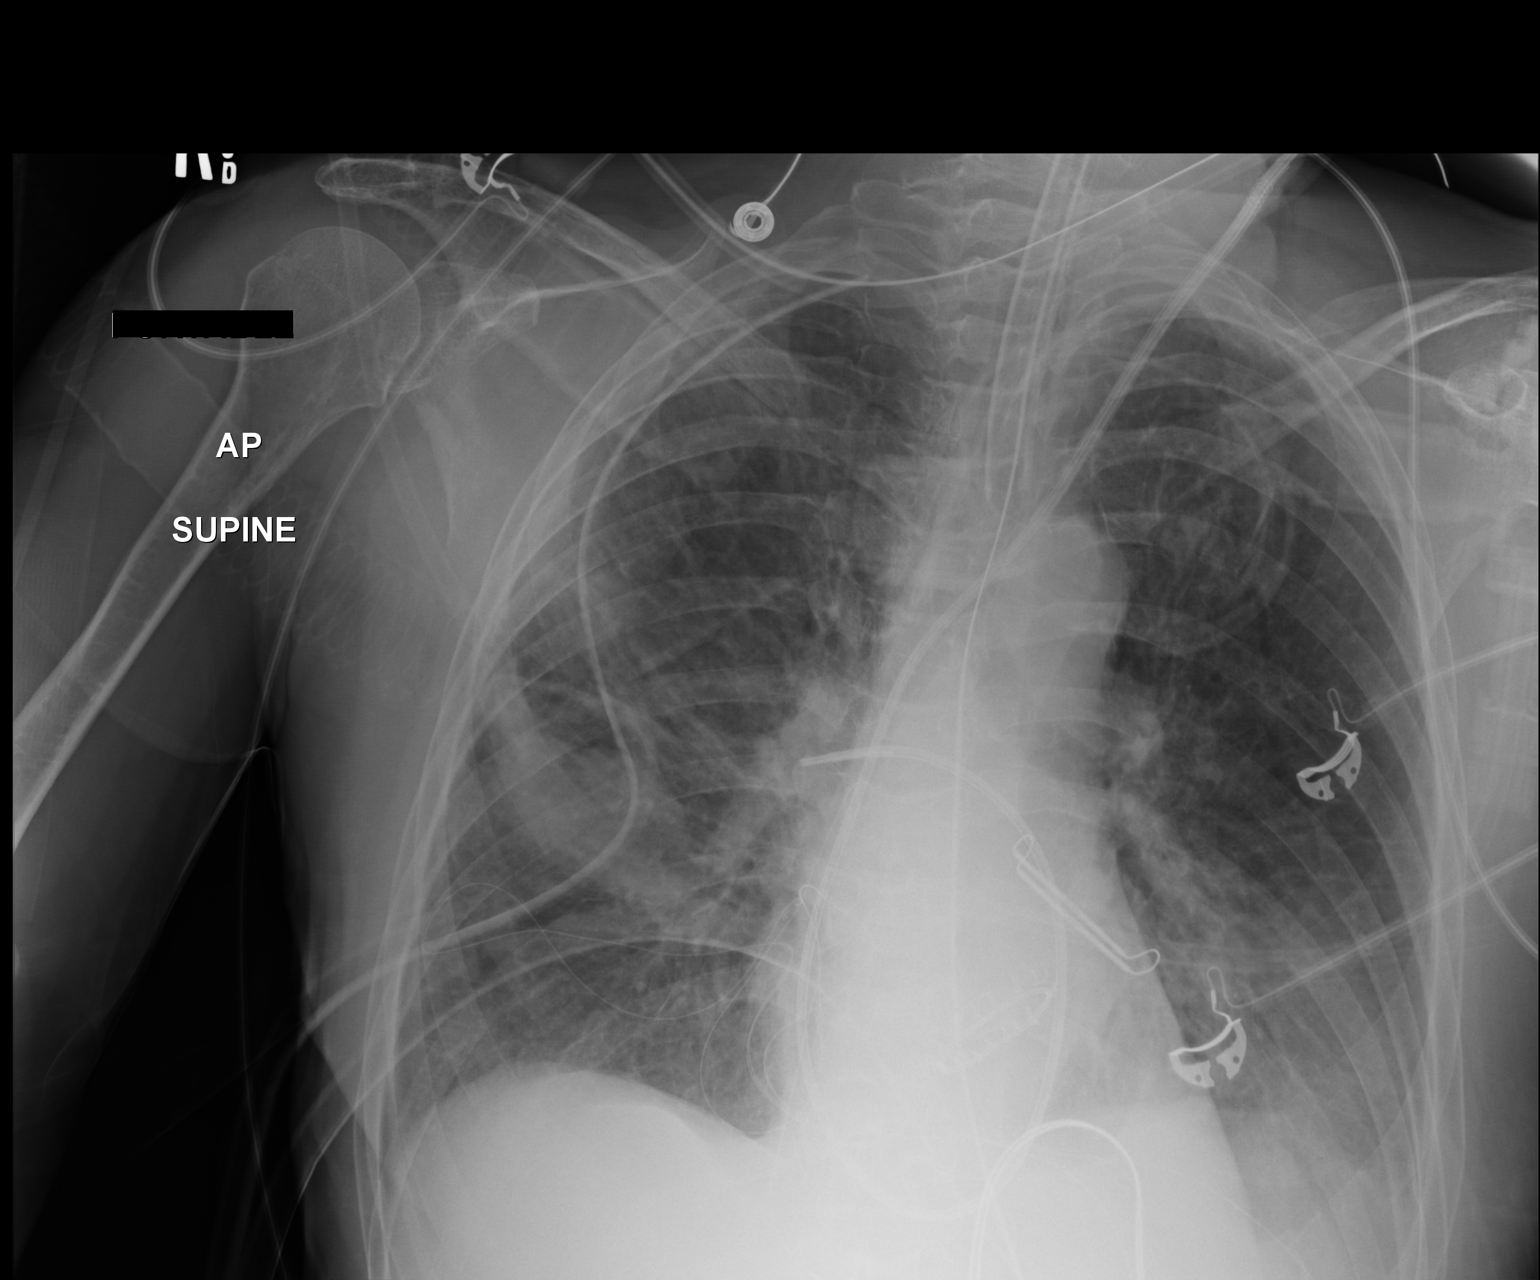

[1 of 1 positions shown; findings below may reference images not displayed]

FINDINGS: Endotracheal tube in good position. Swan-Ganz catheter right
pulmonary artery. Mitral valve replacement. Left atrial appendage
clip in good position. Heart size not enlarged

Diffuse bilateral airspace disease may represent atelectasis or
edema.

2 chest tubes on the right. No pneumothorax. No significant pleural
effusion
IMPRESSION: Bilateral airspace disease likely due to atelectasis. No
pneumothorax or effusion. Postop mitral valve replacement.

## 2017-09-13 IMAGING — CR DG CHEST 1V PORT
1 series · 1 of 1 positions shown · non-contrast
Comparison: 07/02/2016.

CLINICAL DATA: Intubation.  Chest tube .

EXAM:
PORTABLE CHEST 1 VIEW

[AP]
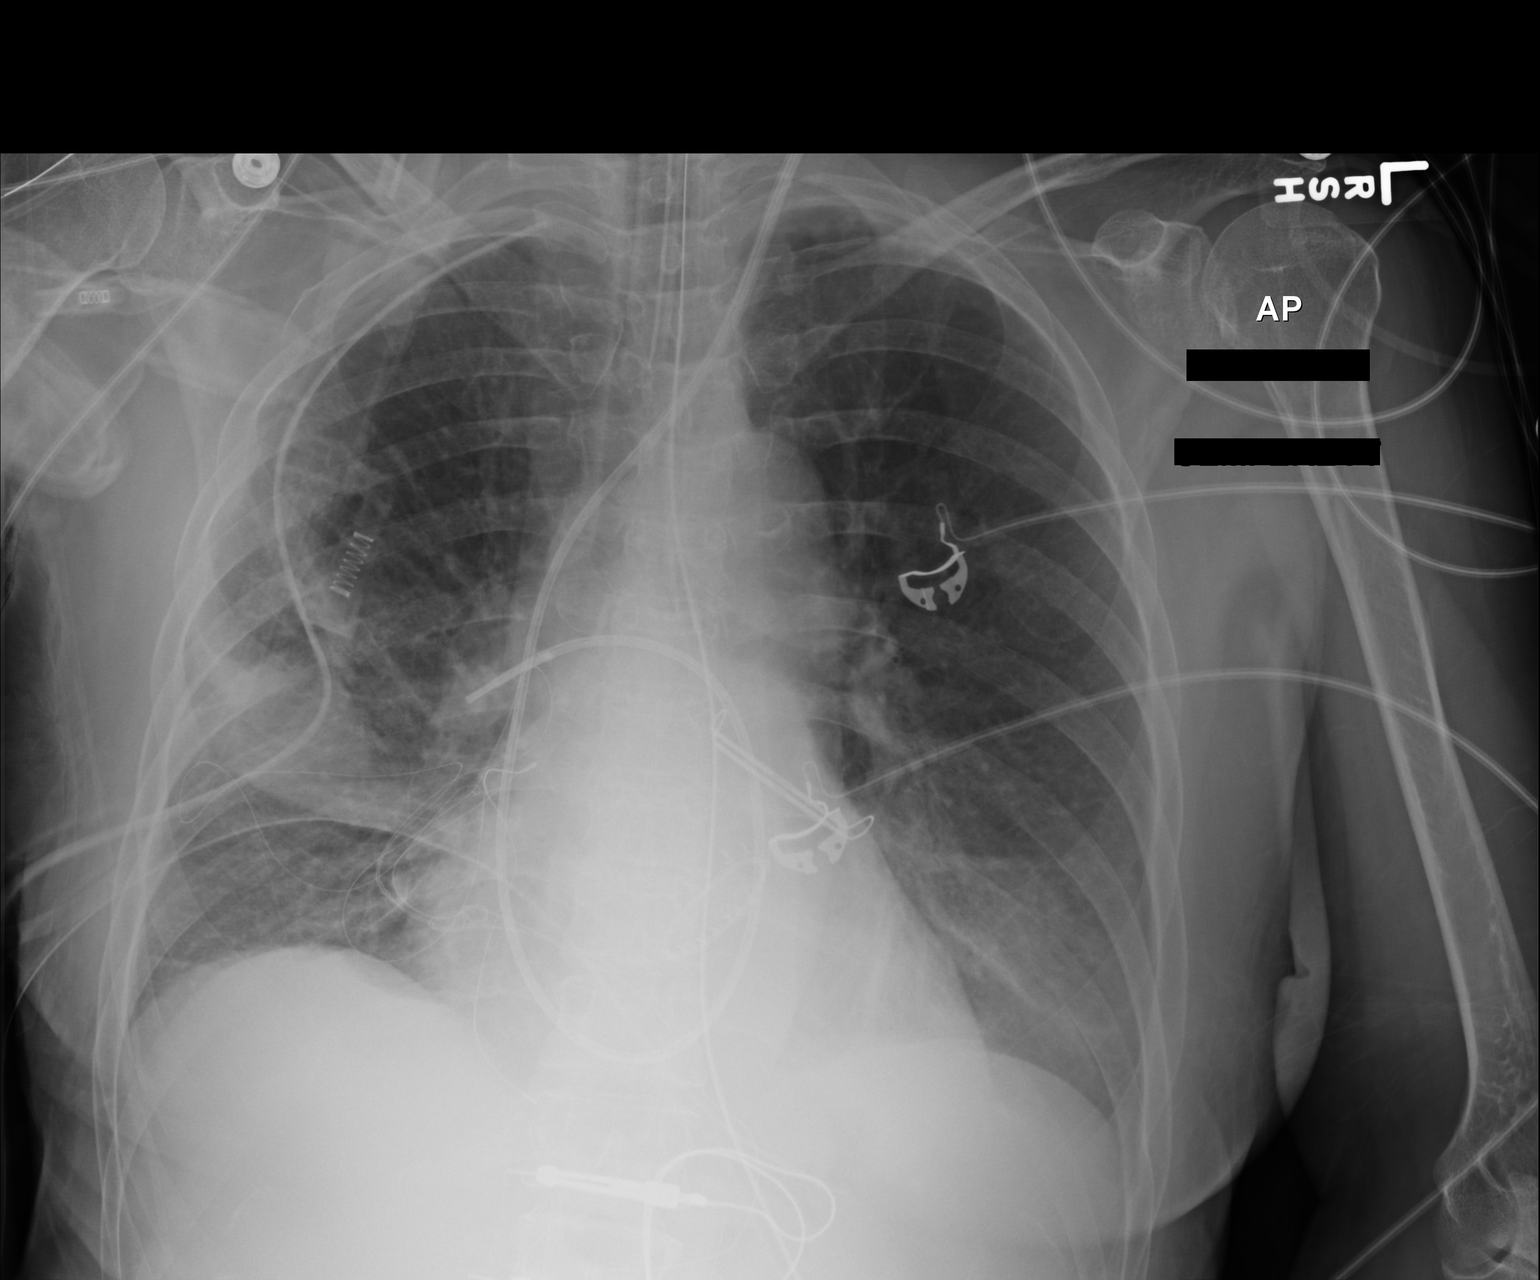

[1 of 1 positions shown; findings below may reference images not displayed]

FINDINGS: Endotracheal tube, NG tube, Swan-Ganz catheter, right chest tubes in
stable position . Prior cardiac valve replacement. Heart size
stable. Persistent right mid and left base pulmonary infiltrates
and/or atelectasis. No pneumothorax.
IMPRESSION: 1. Lines and tubes including right chest tubes in stable position.
No pneumothorax.

2. Persistent right mid lung and left base atelectasis and
infiltrates.

## 2017-09-14 IMAGING — CR DG CHEST 1V PORT
1 series · 1 of 1 positions shown · non-contrast
Comparison: July 03, 2016

CLINICAL DATA: Atelectasis.  Chest tube present

EXAM:
PORTABLE CHEST 1 VIEW

[AP]
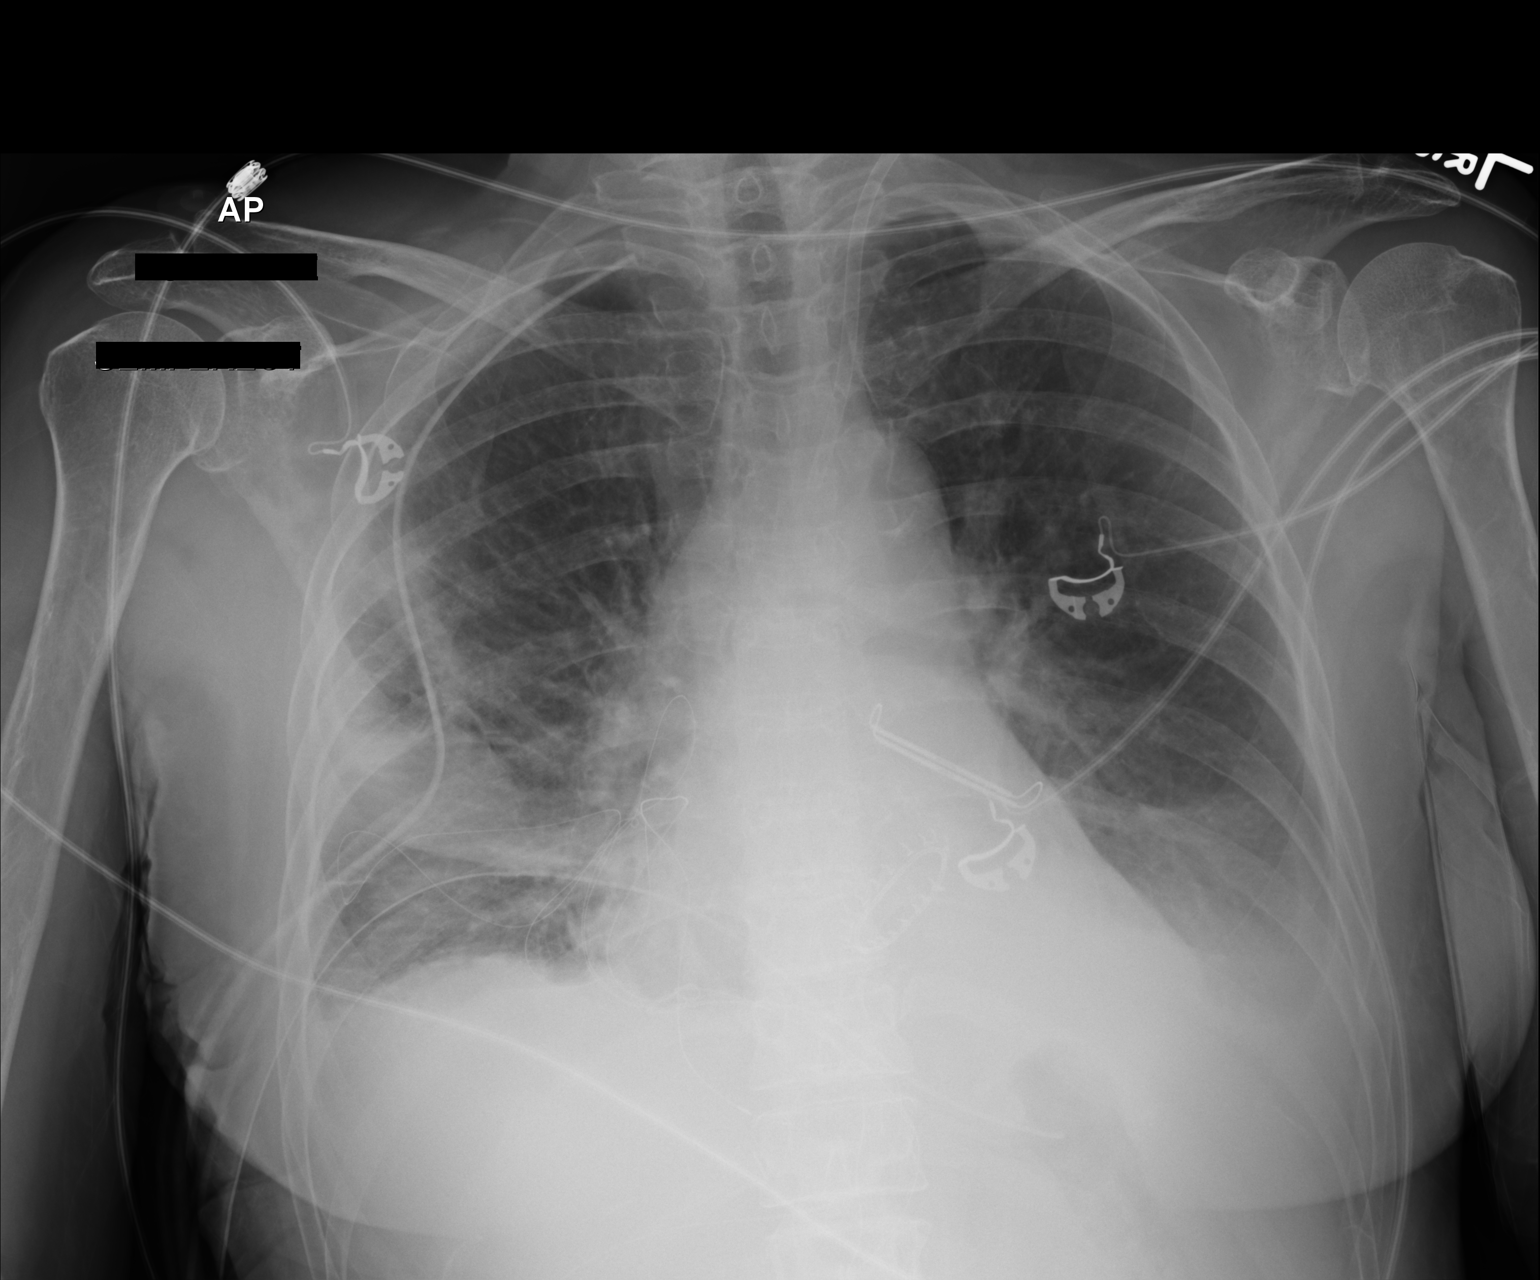

[1 of 1 positions shown; findings below may reference images not displayed]

FINDINGS: Endotracheal tube and nasogastric tube have been removed. Swan-Ganz
catheter is been removed. Cordis tip is in the left innominate vein.
Temporary pacemaker wires are attached to the right heart. Chest
tube on the right has its tip in the right apex region. There is no
appreciable pneumothorax.

There is persistent consolidation in the right lower lobe with small
right pleural effusion. There is a left pleural effusion with
atelectasis in the left base. Heart is upper normal in size with
pulmonary vascularity within normal limits. There is a is prosthetic
mitral valve present as well as a left atrial appendage clamp. There
is aortic atherosclerosis. No evident adenopathy.
IMPRESSION: Tube and catheter positions as described without evident
pneumothorax. Consolidation right lower lung zone region, stable.
Left base atelectasis. Bilateral pleural effusions. Stable cardiac
silhouette. There is aortic atherosclerosis. A comparison with 1 day
prior, left pleural effusion may be slightly larger.

## 2017-09-26 ENCOUNTER — Other Ambulatory Visit: Payer: Self-pay | Admitting: Internal Medicine

## 2017-09-28 ENCOUNTER — Other Ambulatory Visit: Payer: Self-pay

## 2017-09-28 ENCOUNTER — Telehealth: Payer: Self-pay | Admitting: Internal Medicine

## 2017-09-28 MED ORDER — DILTIAZEM HCL ER COATED BEADS 120 MG PO CP24: 120.0000 mg | ORAL_CAPSULE | Freq: Every day | ORAL | 0 refills | Status: DC

## 2017-09-28 MED ORDER — FLECAINIDE ACETATE 100 MG PO TABS: 100.0000 mg | ORAL_TABLET | Freq: Two times a day (BID) | ORAL | 0 refills | Status: DC

## 2017-09-28 NOTE — Telephone Encounter (Signed)
°*  STAT* If patient is at the pharmacy, call can be transferred to refill team.   1. Which medications need to be refilled? (please list name of each medication and dose if known)  Flecainide  Cartia  2. Which pharmacy/location (including street and city if local pharmacy) is medication to be sent to? [facility] on [address]   3. Do they need a 30 day or 90 day supply? 90 day

## 2017-09-30 ENCOUNTER — Telehealth (INDEPENDENT_AMBULATORY_CARE_PROVIDER_SITE_OTHER): Payer: Self-pay | Admitting: *Deleted

## 2017-09-30 ENCOUNTER — Telehealth: Payer: Self-pay | Admitting: Rheumatology

## 2017-09-30 NOTE — Telephone Encounter (Signed)
-----   Message from Shona Needles, RT sent at 09/29/2017  6:53 PM EST ----- Regarding: SCHEDULE APPTS W/TAYLOR Hyalgan BIL Buy and Bill Please call patient and schedule Hyalgan appts x 5 bilateral with Lovena Le, buy and bill. Thank you.

## 2017-09-30 NOTE — Telephone Encounter (Signed)
She is not our patient

## 2017-09-30 NOTE — Telephone Encounter (Signed)
ERROR

## 2017-10-08 ENCOUNTER — Encounter: Payer: Self-pay | Admitting: Internal Medicine

## 2017-10-08 ENCOUNTER — Ambulatory Visit: Payer: PPO | Admitting: Internal Medicine

## 2017-10-08 VITALS — BP 116/72 | HR 77 | Ht 59.5 in | Wt 132.5 lb

## 2017-10-08 DIAGNOSIS — I340 Nonrheumatic mitral (valve) insufficiency: Secondary | ICD-10-CM | POA: Diagnosis not present

## 2017-10-08 DIAGNOSIS — I471 Supraventricular tachycardia: Secondary | ICD-10-CM | POA: Diagnosis not present

## 2017-10-08 DIAGNOSIS — I493 Ventricular premature depolarization: Secondary | ICD-10-CM

## 2017-10-08 DIAGNOSIS — Z9889 Other specified postprocedural states: Secondary | ICD-10-CM

## 2017-10-08 DIAGNOSIS — R0602 Shortness of breath: Secondary | ICD-10-CM | POA: Diagnosis not present

## 2017-10-08 MED ORDER — FLECAINIDE ACETATE 100 MG PO TABS: 50.0000 mg | ORAL_TABLET | Freq: Two times a day (BID) | ORAL | Status: DC

## 2017-10-08 NOTE — Progress Notes (Signed)
Patient Care Team: Tracie Harrier, MD as PCP - General (Internal Medicine)   HPI  Nicole Norman is a 72 y.o. female Seen in followup for VT and PVCs in the context of prior Mitral valve repair ( 07/02/2016 Complex valvuloplasty including quadrangular resection of posterior leaflet, folding plasty, Gore-tex neochord placement x6 and 32 mm Sorin Memo 3D ring annuloplasty with clipping of LA appendage  )  for severe MR.  She is also having systemic symptoms of sweats and hematuria, blood cultures were obtained and were negative.  She was started on flecainide and with that she noted a significant reduction in her palpitations and fatigue.   DATE TEST EF   11/17 Cath  normal % 4+ MR/LAE Severe  12/17 Echo   55-60 %           EF 11/17 60-65%   DATE PR interval QRSduration Flecainide  7/18  182 80 0  9/18 208 114 100  3/19 246 110 100   She got sick in January with the flu.  She has struggled with a multitude of symptoms since then.  The first is been lightheadedness with standing.  She has had some presyncope.  She is also had progressive shortness of breath particularly with bending.  She noted it a couple of years ago when after singing in the choir she was struggling to walk into the congregation.  It has been getting worse.  He denies nocturnal dyspnea orthopnea or peripheral edema.  She has no accompanying chest discomfort.  Records and Results Reviewed   Past Medical History:  Diagnosis Date  . CHF (congestive heart failure) ([place])    a. In setting of severe MR; b. 06/2016 TEE: EF 65-60%.  Marland Kitchen COPD (chronic obstructive pulmonary disease) ([place])    pt. denies  . History of bronchitis   . History of pneumonia   . Incidental pulmonary nodule 06/13/2016   Multiple small ground glass opacities seen on chest CT scan  . Migraine    1x/month - uses Aleve  . Normal coronary arteries    a. cardiac cath 06/19/2016: normal coronary arteries and left dominant system,  LVEDP nl, 4+ mitral regurgitation, RHC showed mildly elevated LVEDP (74mmHg), giant V waves noted on PCWP w/ mild pulmonary HTN., pulmonary pressure 39/10 with a mean of 27 mmHg, mildly reduced cardiac output at 3.1 with a cardiac index of 2.09, recommned mitral valve repair, acceptable volume status  . Paroxysmal SVT (supraventricular tachycardia) ([place])   . PVC's (premature ventricular contractions)    a. Holter 2015 with 26,000 PVCs in 48 hours  . 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 07/02/2016 Complex valvuloplasty including quadrangular resection of posterior leaflet, folding plasty, Gore-tex neochord placement x6 and 32 mm Sorin Memo 3D ring annuloplasty with clipping of LA appendage via right mini thoracotomy approach.  . Severe mitral regurgitation    a. TTE 06/05/16: EF 60-65%, nl WM, LV dias fxn nl, mod prolapse of posterior mitral leaflet with mod eccentric regurg, LA mildly dilated, PASP nl; b. TEE 06/18/16: EF 65-70%, LV mildly dilated, no valvular vegetations seen throughout, mitral valve w/ flail motion involving the middle scallop of the post leaflet. Severe regurgitation directed eccentrically, LA mildly dilated  . Tobacco abuse   . Vertigo    no meds    Past Surgical History:  Procedure Laterality Date  . CARDIAC CATHETERIZATION N/A 06/19/2016   Procedure: Right/Left Heart Cath and Coronary Angiography;  Surgeon: Rogue Jury  Ferne Reus, MD;  Location: [facility];  Service: Cardiovascular;  Laterality: N/A;  . CESAREAN SECTION    . CHOLECYSTECTOMY    . CLIPPING OF ATRIAL APPENDAGE N/A 07/02/2016   Procedure: CLIPPING OF ATRIAL APPENDAGE;  Surgeon: Rexene Alberts, MD;  Location: [facility];  Service: Open Heart Surgery;  Laterality: N/A;  . COLONOSCOPY N/A 12/07/2014   Procedure: COLONOSCOPY;  Surgeon: Lucilla Lame, MD;  Location: [facility];  Service: Gastroenterology;  Laterality: N/A;  . ESOPHAGOGASTRODUODENOSCOPY N/A 12/07/2014   Procedure:  ESOPHAGOGASTRODUODENOSCOPY (EGD);  Surgeon: Lucilla Lame, MD;  Location: [facility];  Service: Gastroenterology;  Laterality: N/A;  . EYE SURGERY Bilateral    lasic  . MITRAL VALVE REPAIR Right 07/02/2016   Procedure: MINIMALLY INVASIVE MITRAL VALVE REPAIR (MVR);  Surgeon: Rexene Alberts, MD;  Location: [facility];  Service: Open Heart Surgery;  Laterality: Right;  . TEE WITHOUT CARDIOVERSION N/A 06/18/2016   Procedure: TRANSESOPHAGEAL ECHOCARDIOGRAM (TEE);  Surgeon: Nelva Bush, MD;  Location: [facility];  Service: Cardiovascular;  Laterality: N/A;  . TEE WITHOUT CARDIOVERSION N/A 07/02/2016   Procedure: TRANSESOPHAGEAL ECHOCARDIOGRAM (TEE);  Surgeon: Rexene Alberts, MD;  Location: [facility];  Service: Open Heart Surgery;  Laterality: N/A;  . TONSILLECTOMY      Current Outpatient Medications  Medication Sig Dispense Refill  . aspirin EC 81 MG tablet Take 1 tablet (81 mg total) by mouth daily.    . calcium-vitamin D (OSCAL WITH D) 500-200 MG-UNIT tablet Take 1 tablet by mouth.    . cholecalciferol (VITAMIN D) 1000 units tablet Take 5,000 Units by mouth daily.    Marland Kitchen diltiazem (CARTIA XT) 120 MG 24 hr capsule Take 1 capsule (120 mg total) by mouth daily. 90 capsule 0  . flecainide (TAMBOCOR) 100 MG tablet Take 1 tablet (100 mg total) by mouth 2 (two) times daily. 180 tablet 0  . vitamin B-12 (CYANOCOBALAMIN) 1000 MCG tablet Take 1,000 mcg by mouth daily.     No current facility-administered medications for this visit.     No Known Allergies    Review of Systems negative except from HPI and PMH  Physical Exam BP 116/72 (BP Location: Left Arm, Patient Position: Sitting, Cuff Size: Normal)   Pulse 77   Ht 4' 11.5" (1.511 m)   Wt 132 lb 8 oz (60.1 kg)   BMI 26.31 kg/m  Well developed and nourished in modest respiratory distress getting onto the examining table  HENT normal Neck supple with JVP-flat Clear Regular rate and rhythm, no murmurs or gallops; PMI nondisplaced Abd-soft  with active BS No Clubbing cyanosis edema Skin-warm and dry A & Oriented  Grossly normal sensory and motor function  ECG sinus rhythm at 77 Intervals 25/11/40  Assessment and  Plan PVCs-frequent right bundle branch superior axis morphology/nonsustained VT  Mitral valve repair 12/17  SVT   Orthostatic intolerance with hypotension  Dyspnea without demonstrable change in oxygen saturation   PVC burden is markedly diminished.  ECG suggests excess sodium channel blockade with by PR prolongation and QRS widening.  We will decrease her flecainide from 100--50 mg twice daily.  I am concerned about the dyspnea particularly with bending.  We will repeat her ultrasound.  I have asked that she follow-up with her primary care physician for further evaluation.  She has had an intercurrent flu illness.  She has had chronic shortness of breath but this is considerably worse.  Chronicity of her shortness of breath largely have been attenuated by  her mitral valve surgery  Given her relatively low blood pressure and orthostasis, I have taken the liberty of stopping her Cardizem  She also needs repeat of her blood work.  Her last CBC was 6/18  More than 50% of 45 min was spent in counseling related to the above

## 2017-10-08 NOTE — Patient Instructions (Addendum)
Medication Instructions: - Your physician has recommended you make the following change in your medication:  1) STOP cardizem (diltiazem) 2) DECREASE flecainide 100 mg- take 1/2 tablet (50 mg) by mouth TWICE daily  Labwork: - none ordered  Procedures/Testing: - Your physician has requested that you have an echocardiogram. Echocardiography is a painless test that uses sound waves to create images of your heart. It provides your doctor with information about the size and shape of your heart and how well your heart's chambers and valves are working. This procedure takes approximately one hour. There are no restrictions for this procedure.  Follow-Up: - Your physician recommends that you schedule a follow-up appointment in: 6 weeks with Dr. Caryl Comes.   Any Additional Special Instructions Will Be Listed Below (If Applicable).     If you need a refill on your cardiac medications before your next appointment, please call your pharmacy.

## 2017-10-20 DIAGNOSIS — E538 Deficiency of other specified B group vitamins: Secondary | ICD-10-CM | POA: Diagnosis not present

## 2017-10-20 DIAGNOSIS — Z0000 Encounter for general adult medical examination without abnormal findings: Secondary | ICD-10-CM | POA: Diagnosis not present

## 2017-10-20 DIAGNOSIS — Z87891 Personal history of nicotine dependence: Secondary | ICD-10-CM | POA: Diagnosis not present

## 2017-10-20 DIAGNOSIS — R42 Dizziness and giddiness: Secondary | ICD-10-CM | POA: Diagnosis not present

## 2017-10-20 DIAGNOSIS — R7989 Other specified abnormal findings of blood chemistry: Secondary | ICD-10-CM | POA: Diagnosis not present

## 2017-10-20 DIAGNOSIS — R0602 Shortness of breath: Secondary | ICD-10-CM | POA: Diagnosis not present

## 2017-10-30 ENCOUNTER — Other Ambulatory Visit: Payer: Self-pay

## 2017-10-30 ENCOUNTER — Ambulatory Visit (INDEPENDENT_AMBULATORY_CARE_PROVIDER_SITE_OTHER): Payer: PPO

## 2017-10-30 ENCOUNTER — Telehealth: Payer: Self-pay | Admitting: Internal Medicine

## 2017-10-30 ENCOUNTER — Other Ambulatory Visit: Payer: Self-pay | Admitting: *Deleted

## 2017-10-30 DIAGNOSIS — R0602 Shortness of breath: Secondary | ICD-10-CM

## 2017-10-30 MED ORDER — FLECAINIDE ACETATE 50 MG PO TABS: 50.0000 mg | ORAL_TABLET | Freq: Two times a day (BID) | ORAL | 3 refills | Status: DC

## 2017-10-30 NOTE — Telephone Encounter (Signed)
°*  STAT* If patient is at the pharmacy, call can be transferred to refill team.   1. Which medications need to be refilled? (please list name of each medication and dose if known)      Flecainide 50 mg po BID    2. Which pharmacy/location (including street and city if local pharmacy) is medication to be sent to?     [facility] [address] [place]   3. Do they need a 30 day or 90 day supply? 90   Patient states she had a recent dose change and has tried to cut 100 mg pills but they crumble and are not easy to split please call in new rx for decreased dosage so she does not have to do this

## 2017-10-30 NOTE — Telephone Encounter (Signed)
New Rx has been sent to [facility] on [address] . Flecainide 50 mg tablet BID.

## 2017-11-03 DIAGNOSIS — R42 Dizziness and giddiness: Secondary | ICD-10-CM | POA: Diagnosis not present

## 2017-11-03 DIAGNOSIS — I6523 Occlusion and stenosis of bilateral carotid arteries: Secondary | ICD-10-CM | POA: Diagnosis not present

## 2017-11-11 DIAGNOSIS — R0602 Shortness of breath: Secondary | ICD-10-CM | POA: Diagnosis not present

## 2017-11-19 ENCOUNTER — Ambulatory Visit: Payer: PPO | Admitting: Internal Medicine

## 2017-12-17 ENCOUNTER — Ambulatory Visit: Payer: PPO | Admitting: Internal Medicine

## 2017-12-17 ENCOUNTER — Encounter: Payer: Self-pay | Admitting: Internal Medicine

## 2017-12-17 VITALS — BP 100/68 | HR 74 | Ht 59.5 in | Wt 131.0 lb

## 2017-12-17 DIAGNOSIS — I493 Ventricular premature depolarization: Secondary | ICD-10-CM

## 2017-12-17 NOTE — Progress Notes (Signed)
Patient Care Team: Tracie Harrier, MD as PCP - General (Internal Medicine)   HPI  Nicole Norman is a 72 y.o. female Seen in followup for VT and PVCs in the context of prior Mitral valve repair ( 07/02/2016 Complex valvuloplasty including quadrangular resection of posterior leaflet, folding plasty, Gore-tex neochord placement x6 and 32 mm Sorin Memo 3D ring annuloplasty with clipping of LA appendage  )  for severe MR.   She was started on flecainide and with that she noted a significant reduction in her palpitations and fatigue.  This is continued.  Dyspnea that was an issue before has now resolved. She is active  DATE TEST EF   11/17 Cath  normal % 4+ MR/LAE Severe  12/17 Echo   55-60 %   3/19 Echo  55-65%       DATE PR interval QRSduration Flecainide  7/18  182 80 0  9/18 208 114 100  3/19 246 110 100  5/19 180 104 50      Past Medical History:  Diagnosis Date  . CHF (congestive heart failure) ([place])    a. In setting of severe MR; b. 06/2016 TEE: EF 65-60%.  Marland Kitchen COPD (chronic obstructive pulmonary disease) ([place])    pt. denies  . History of bronchitis   . History of pneumonia   . Incidental pulmonary nodule 06/13/2016   Multiple small ground glass opacities seen on chest CT scan  . Migraine    1x/month - uses Aleve  . Normal coronary arteries    a. cardiac cath 06/19/2016: normal coronary arteries and left dominant system, LVEDP nl, 4+ mitral regurgitation, RHC showed mildly elevated LVEDP (66mmHg), giant V waves noted on PCWP w/ mild pulmonary HTN., pulmonary pressure 39/10 with a mean of 27 mmHg, mildly reduced cardiac output at 3.1 with a cardiac index of 2.09, recommned mitral valve repair, acceptable volume status  . Paroxysmal SVT (supraventricular tachycardia) ([place])   . PVC's (premature ventricular contractions)    a. Holter 2015 with 26,000 PVCs in 48 hours  . 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 07/02/2016 Complex  valvuloplasty including quadrangular resection of posterior leaflet, folding plasty, Gore-tex neochord placement x6 and 32 mm Sorin Memo 3D ring annuloplasty with clipping of LA appendage via right mini thoracotomy approach.  . Severe mitral regurgitation    a. TTE 06/05/16: EF 60-65%, nl WM, LV dias fxn nl, mod prolapse of posterior mitral leaflet with mod eccentric regurg, LA mildly dilated, PASP nl; b. TEE 06/18/16: EF 65-70%, LV mildly dilated, no valvular vegetations seen throughout, mitral valve w/ flail motion involving the middle scallop of the post leaflet. Severe regurgitation directed eccentrically, LA mildly dilated  . Tobacco abuse   . Vertigo    no meds    Past Surgical History:  Procedure Laterality Date  . CARDIAC CATHETERIZATION N/A 06/19/2016   Procedure: Right/Left Heart Cath and Coronary Angiography;  Surgeon: Wellington Hampshire, MD;  Location: [facility];  Service: Cardiovascular;  Laterality: N/A;  . CESAREAN SECTION    . CHOLECYSTECTOMY    . CLIPPING OF ATRIAL APPENDAGE N/A 07/02/2016   Procedure: CLIPPING OF ATRIAL APPENDAGE;  Surgeon: Rexene Alberts, MD;  Location: [facility];  Service: Open Heart Surgery;  Laterality: N/A;  . COLONOSCOPY N/A 12/07/2014   Procedure: COLONOSCOPY;  Surgeon: Lucilla Lame, MD;  Location: [facility];  Service: Gastroenterology;  Laterality: N/A;  . ESOPHAGOGASTRODUODENOSCOPY N/A 12/07/2014   Procedure: ESOPHAGOGASTRODUODENOSCOPY (  EGD);  Surgeon: Lucilla Lame, MD;  Location: [facility];  Service: Gastroenterology;  Laterality: N/A;  . EYE SURGERY Bilateral    lasic  . MITRAL VALVE REPAIR Right 07/02/2016   Procedure: MINIMALLY INVASIVE MITRAL VALVE REPAIR (MVR);  Surgeon: Rexene Alberts, MD;  Location: [facility];  Service: Open Heart Surgery;  Laterality: Right;  . TEE WITHOUT CARDIOVERSION N/A 06/18/2016   Procedure: TRANSESOPHAGEAL ECHOCARDIOGRAM (TEE);  Surgeon: Nelva Bush, MD;  Location: [facility];  Service:  Cardiovascular;  Laterality: N/A;  . TEE WITHOUT CARDIOVERSION N/A 07/02/2016   Procedure: TRANSESOPHAGEAL ECHOCARDIOGRAM (TEE);  Surgeon: Rexene Alberts, MD;  Location: [facility];  Service: Open Heart Surgery;  Laterality: N/A;  . TONSILLECTOMY      Current Outpatient Medications  Medication Sig Dispense Refill  . aspirin EC 81 MG tablet Take 1 tablet (81 mg total) by mouth daily.    . calcium-vitamin D (OSCAL WITH D) 500-200 MG-UNIT tablet Take 1 tablet by mouth.    . cholecalciferol (VITAMIN D) 1000 units tablet Take 5,000 Units by mouth daily.    . flecainide (TAMBOCOR) 50 MG tablet Take 1 tablet (50 mg total) by mouth 2 (two) times daily. 180 tablet 3  . vitamin B-12 (CYANOCOBALAMIN) 1000 MCG tablet Take 1,000 mcg by mouth daily.     No current facility-administered medications for this visit.     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  Her dyspnea is also largely resolved. No attributable symptoms to her flecainide.  ECG much improved with less evidence of conductionslowing   F/u 6 m

## 2017-12-17 NOTE — Addendum Note (Signed)
Addended by: Anselm Pancoast on: 12/17/2017 12:49 PM   Modules accepted: Orders

## 2017-12-17 NOTE — Patient Instructions (Signed)

## 2018-04-20 DIAGNOSIS — Z9889 Other specified postprocedural states: Secondary | ICD-10-CM | POA: Diagnosis not present

## 2018-04-20 DIAGNOSIS — Z0000 Encounter for general adult medical examination without abnormal findings: Secondary | ICD-10-CM | POA: Diagnosis not present

## 2018-04-20 DIAGNOSIS — Z720 Tobacco use: Secondary | ICD-10-CM | POA: Diagnosis not present

## 2018-05-20 ENCOUNTER — Encounter: Payer: Self-pay | Admitting: Cardiovascular Disease

## 2018-05-20 ENCOUNTER — Ambulatory Visit: Payer: PPO | Admitting: Cardiovascular Disease

## 2018-05-20 VITALS — BP 124/68 | HR 100 | Ht 59.5 in | Wt 122.0 lb

## 2018-05-20 DIAGNOSIS — R002 Palpitations: Secondary | ICD-10-CM

## 2018-05-20 DIAGNOSIS — I493 Ventricular premature depolarization: Secondary | ICD-10-CM

## 2018-05-20 DIAGNOSIS — Z9889 Other specified postprocedural states: Secondary | ICD-10-CM | POA: Diagnosis not present

## 2018-05-20 NOTE — Patient Instructions (Signed)
Medication Instructions:  No changes  If you need a refill on your cardiac medications before your next appointment, please call your pharmacy.   Lab work: None ordered  Testing/Procedures: Your physician has recommended that you wear a 48 holter monitor. Holter monitors are medical devices that record the heart's electrical activity. Doctors most often use these monitors to diagnose arrhythmias. Arrhythmias are problems with the speed or rhythm of the heartbeat. The monitor is a small, portable device. You can wear one while you do your normal daily activities. This is usually used to diagnose what is causing palpitations/syncope (passing out).   Follow-Up: At [facility], you and your health needs are our priority.  As part of our continuing mission to provide you with exceptional heart care, we have created designated Provider Care Teams.  These Care Teams include your primary Cardiologist (physician) and Advanced Practice Providers (APPs -  Physician Assistants and Nurse Practitioners) who all work together to provide you with the care you need, when you need it. You will need a follow up appointment in 6 months.  Please call our office 2 months in advance to schedule this appointment.  You may see Dr. Fletcher Anon or one of the following Advanced Practice Providers on your designated Care Team:   Murray Hodgkins, NP Christell Faith, PA-C . Marrianne Mood, PA-C

## 2018-05-20 NOTE — Progress Notes (Signed)
Cardiology Office Note   Date:  05/20/2018   ID:  BRYSON PALEN, DOB [birth year], MRN [id number]  PCP:  Tracie Harrier, MD  Cardiologist:   Kathlyn Sacramento, MD   Chief Complaint  Patient presents with  . Other    6 month f/u c/o irregular heart beat , fatigue and inbalance/lightheaded. Meds reviewed verbally with pt.      History of Present Illness: RAMONITA KOENIG is a 72 y.o. female who presents for a follow-up visit regarding mitral valve prolapse with regurgitation status post minimally invasive mitral valve repair in November 2017. Cardiac catheterization before mitral valve surgery showed no significant coronary artery disease. She has no history of diabetes, hypertension or hyperlipidemia.  She quit smoking in November.  She is known to have frequent PVCs with previous 48-hour Holter monitor showing a total of 53,000 beats representing 23% burden with short runs of nonsustained ventricular tachycardia. She was referred to Dr. Caryl Comes and has been treated with flecainide. Echocardiogram in March 2019 showed normal LV systolic function with intact mitral valve with a peak gradient of 4 mmHg and mild mitral regurgitation.  He has been doing reasonably well with no chest pain.  She has chronic exertional dyspnea.  She does complain of worsening palpitations in addition to fatigue and getting dizzy and lightheaded.  No syncope.  She has chronic back pain and does not exercise on a regular basis.  She is not really very active.  Past Medical History:  Diagnosis Date  . CHF (congestive heart failure) ([place])    a. In setting of severe MR; b. 06/2016 TEE: EF 65-60%.  Marland Kitchen COPD (chronic obstructive pulmonary disease) ([place])    pt. denies  . History of bronchitis   . History of pneumonia   . Incidental pulmonary nodule 06/13/2016   Multiple small ground glass opacities seen on chest CT scan  . Migraine    1x/month - uses Aleve  . Normal coronary arteries    a. cardiac  cath 06/19/2016: normal coronary arteries and left dominant system, LVEDP nl, 4+ mitral regurgitation, RHC showed mildly elevated LVEDP (41mmHg), giant V waves noted on PCWP w/ mild pulmonary HTN., pulmonary pressure 39/10 with a mean of 27 mmHg, mildly reduced cardiac output at 3.1 with a cardiac index of 2.09, recommned mitral valve repair, acceptable volume status  . Paroxysmal SVT (supraventricular tachycardia) ([place])   . PVC's (premature ventricular contractions)    a. Holter 2015 with 26,000 PVCs in 48 hours  . 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 07/02/2016 Complex valvuloplasty including quadrangular resection of posterior leaflet, folding plasty, Gore-tex neochord placement x6 and 32 mm Sorin Memo 3D ring annuloplasty with clipping of LA appendage via right mini thoracotomy approach.  . Severe mitral regurgitation    a. TTE 06/05/16: EF 60-65%, nl WM, LV dias fxn nl, mod prolapse of posterior mitral leaflet with mod eccentric regurg, LA mildly dilated, PASP nl; b. TEE 06/18/16: EF 65-70%, LV mildly dilated, no valvular vegetations seen throughout, mitral valve w/ flail motion involving the middle scallop of the post leaflet. Severe regurgitation directed eccentrically, LA mildly dilated  . Tobacco abuse   . Vertigo    no meds    Past Surgical History:  Procedure Laterality Date  . CARDIAC CATHETERIZATION N/A 06/19/2016   Procedure: Right/Left Heart Cath and Coronary Angiography;  Surgeon: Wellington Hampshire, MD;  Location: [facility];  Service: Cardiovascular;  Laterality: N/A;  . CESAREAN SECTION    .  CHOLECYSTECTOMY    . CLIPPING OF ATRIAL APPENDAGE N/A 07/02/2016   Procedure: CLIPPING OF ATRIAL APPENDAGE;  Surgeon: Rexene Alberts, MD;  Location: [facility];  Service: Open Heart Surgery;  Laterality: N/A;  . COLONOSCOPY N/A 12/07/2014   Procedure: COLONOSCOPY;  Surgeon: Lucilla Lame, MD;  Location: [facility];  Service: Gastroenterology;  Laterality:  N/A;  . ESOPHAGOGASTRODUODENOSCOPY N/A 12/07/2014   Procedure: ESOPHAGOGASTRODUODENOSCOPY (EGD);  Surgeon: Lucilla Lame, MD;  Location: [facility];  Service: Gastroenterology;  Laterality: N/A;  . EYE SURGERY Bilateral    lasic  . MITRAL VALVE REPAIR Right 07/02/2016   Procedure: MINIMALLY INVASIVE MITRAL VALVE REPAIR (MVR);  Surgeon: Rexene Alberts, MD;  Location: [facility];  Service: Open Heart Surgery;  Laterality: Right;  . TEE WITHOUT CARDIOVERSION N/A 06/18/2016   Procedure: TRANSESOPHAGEAL ECHOCARDIOGRAM (TEE);  Surgeon: Nelva Bush, MD;  Location: [facility];  Service: Cardiovascular;  Laterality: N/A;  . TEE WITHOUT CARDIOVERSION N/A 07/02/2016   Procedure: TRANSESOPHAGEAL ECHOCARDIOGRAM (TEE);  Surgeon: Rexene Alberts, MD;  Location: [facility];  Service: Open Heart Surgery;  Laterality: N/A;  . TONSILLECTOMY       Current Outpatient Medications  Medication Sig Dispense Refill  . aspirin EC 81 MG tablet Take 1 tablet (81 mg total) by mouth daily.    . calcium-vitamin D (OSCAL WITH D) 500-200 MG-UNIT tablet Take 1 tablet by mouth.    . cholecalciferol (VITAMIN D) 1000 units tablet Take 5,000 Units by mouth daily.    . flecainide (TAMBOCOR) 50 MG tablet Take 1 tablet (50 mg total) by mouth 2 (two) times daily. 180 tablet 3  . vitamin B-12 (CYANOCOBALAMIN) 1000 MCG tablet Take 1,000 mcg by mouth daily.     No current facility-administered medications for this visit.     Allergies:   Patient has no known allergies.    Social History:  The patient  reports that she quit smoking about 22 months ago. Her smoking use included cigarettes. She has a 22.50 pack-year smoking history. She has never used smokeless tobacco. She reports that she drinks alcohol. She reports that she does not use drugs.   Family History:  The patient's family history includes Alzheimer's disease in her mother; Breast cancer (age of onset: 2) in her sister; Cancer in her brother, brother, sister, and sister;  Stroke in her father.    ROS:  Please see the history of present illness.   Otherwise, review of systems are positive for none.   All other systems are reviewed and negative.    PHYSICAL EXAM: VS:  BP 124/68 (BP Location: Left Arm, Patient Position: Sitting, Cuff Size: Normal)   Pulse 100   Ht 4' 11.5" (1.511 m)   Wt 122 lb (55.3 kg)   BMI 24.23 kg/m  , BMI Body mass index is 24.23 kg/m. GEN: Well nourished, well developed, in no acute distress  HEENT: normal  Neck: no JVD, carotid bruits, or masses Cardiac: RRR ; no  Rubs, murmurs or gallops,no edema . Respiratory:  clear to auscultation bilaterally, normal work of breathing GI: soft, nontender, nondistended, + BS MS: no deformity or atrophy  Skin: warm and dry, no rash Neuro:  Strength and sensation are intact Psych: euthymic mood, full affect   EKG:  EKG is ordered today. EKG showed normal sinus rhythm with no PVCs.   Recent Labs: No results found for requested labs within last 8760 hours.    Lipid Panel    Component Value Date/Time  CHOL 138 06/16/2016 0410   TRIG 112 06/16/2016 0410   HDL 50 06/16/2016 0410   CHOLHDL 2.8 06/16/2016 0410   VLDL 22 06/16/2016 0410   LDLCALC 66 06/16/2016 0410      Wt Readings from Last 3 Encounters:  05/20/18 122 lb (55.3 kg)  12/17/17 131 lb (59.4 kg)  10/08/17 132 lb 8 oz (60.1 kg)       No flowsheet data found.    ASSESSMENT AND PLAN:  1.  Mitral valve disease with mitral valve prolapse: Status post mitral valve repair in November 2017.  Most recent echo in March showed normal functioning mitral valve with only mild regurgitation.  Ejection fraction was normal.  2. Frequent PVCs: These seem to be well controlled with flecainide.  However, she is complaining of recurrent palpitations in addition to dizziness.  I do not see any PVCs on her EKG today and by exam she has no premature beats.  I requested a 48-hour Holter monitor.  We might need to consider adding a  small dose beta-blocker.  Physical deconditioning is also a possibility.    Disposition:   FU with me in 6 months.   Signed,  Kathlyn Sacramento, MD  05/20/2018 9:31 AM    [place]

## 2018-05-24 ENCOUNTER — Ambulatory Visit (INDEPENDENT_AMBULATORY_CARE_PROVIDER_SITE_OTHER): Payer: PPO

## 2018-05-24 DIAGNOSIS — I493 Ventricular premature depolarization: Secondary | ICD-10-CM | POA: Diagnosis not present

## 2018-05-24 DIAGNOSIS — R002 Palpitations: Secondary | ICD-10-CM | POA: Diagnosis not present

## 2018-05-24 NOTE — Addendum Note (Signed)
Addended by: Britt Bottom on: 05/24/2018 07:21 AM   Modules accepted: Orders

## 2018-05-28 ENCOUNTER — Ambulatory Visit
Admission: RE | Admit: 2018-05-28 | Discharge: 2018-05-28 | Disposition: A | Discharge status: Home / Self Care | Payer: PPO | Source: Ambulatory Visit | Attending: Cardiovascular Disease | Admitting: Cardiovascular Disease

## 2018-05-28 DIAGNOSIS — R002 Palpitations: Secondary | ICD-10-CM | POA: Insufficient documentation

## 2018-05-28 DIAGNOSIS — I493 Ventricular premature depolarization: Secondary | ICD-10-CM | POA: Insufficient documentation

## 2018-06-15 ENCOUNTER — Telehealth: Payer: Self-pay | Admitting: *Deleted

## 2018-06-15 MED ORDER — METOPROLOL SUCCINATE ER 25 MG PO TB24: 25.0000 mg | ORAL_TABLET | Freq: Every day | ORAL | 1 refills | Status: DC

## 2018-06-15 NOTE — Telephone Encounter (Signed)
-----   Message from Wellington Hampshire, MD sent at 06/14/2018  5:35 PM EST ----- Inform patient that Holter monitor showed frequent PVCs but less than before.  Continue flecainide.  Add small dose Toprol 25 mg once daily.  She should let us know how she feels in 1 to 2 weeks.

## 2018-06-15 NOTE — Telephone Encounter (Signed)
Patient made aware of results and agreed to start Toprol 25 mg daily. Prescription has been sent in. She will call back in a few weeks with an update.

## 2018-06-16 NOTE — Telephone Encounter (Signed)
Patient called asking if she could move her appointment out for Dr. Caryl Comes since she just saw Dr. Fletcher Anon. Per Dr. Caryl Comes this was fine. Her appointment has been cancelled for 11/21.

## 2018-06-24 ENCOUNTER — Ambulatory Visit: Payer: PPO | Admitting: Internal Medicine

## 2018-08-13 ENCOUNTER — Telehealth: Payer: Self-pay | Admitting: Cardiovascular Disease

## 2018-08-13 NOTE — Telephone Encounter (Signed)
Pt would like Dr. Fletcher Anon to write her a letter , so she will not have to serve jury duty. (Due to her heart condition)

## 2018-08-13 NOTE — Telephone Encounter (Signed)
Yes you can write her a letter in this regard.  Thanks

## 2018-08-13 NOTE — Telephone Encounter (Signed)
To Dr. Fletcher Anon,  [facility] to do a letter?

## 2018-08-16 ENCOUNTER — Encounter: Payer: Self-pay | Admitting: *Deleted

## 2018-08-16 NOTE — Telephone Encounter (Signed)
I called and spoke with the patient.  Confirmed she is serving jury duty in [place].  Juror # 2  Called to serve on 09/07/2018.  I advised the patient I should have her letter ready by the end of the day and she can pick this up at the front desk any time after today. The patient voices understanding and is agreeable.    To Dr. Fletcher Anon to clarify-  Do you want me to ask she be excused permanently or for a certain time frame? The patient feels she will have a lot of anxiety if she has to serve and this will lead to have having tachycardia/ more palpitations.  Just let me know.  Thanks!!

## 2018-08-16 NOTE — Telephone Encounter (Signed)
Permanently.

## 2018-08-16 NOTE — Telephone Encounter (Signed)
Letter done and placed at the front desk for pick up.

## 2018-08-17 ENCOUNTER — Encounter: Payer: Self-pay | Admitting: *Deleted

## 2018-08-17 NOTE — Telephone Encounter (Signed)
Letter corrected and placed at the front desk for patient pick up.  Copy of released to MyChart.

## 2018-08-17 NOTE — Telephone Encounter (Signed)
Patient calling to have letter corrected .  Placed on [employer]

## 2018-09-03 ENCOUNTER — Other Ambulatory Visit: Payer: Self-pay | Admitting: Internal Medicine

## 2018-09-03 DIAGNOSIS — Z1231 Encounter for screening mammogram for malignant neoplasm of breast: Secondary | ICD-10-CM

## 2018-09-22 ENCOUNTER — Ambulatory Visit
Admission: RE | Admit: 2018-09-22 | Discharge: 2018-09-22 | Disposition: A | Discharge status: Home / Self Care | Payer: PPO | Source: Ambulatory Visit | Attending: Internal Medicine | Admitting: Internal Medicine

## 2018-09-22 DIAGNOSIS — Z1231 Encounter for screening mammogram for malignant neoplasm of breast: Secondary | ICD-10-CM | POA: Diagnosis not present

## 2018-10-18 ENCOUNTER — Other Ambulatory Visit: Payer: Self-pay | Admitting: Cardiovascular Disease

## 2018-10-18 ENCOUNTER — Other Ambulatory Visit: Payer: Self-pay | Admitting: Internal Medicine

## 2018-10-19 NOTE — Telephone Encounter (Signed)
Please review for refill of metoprolol see note below, pt has started medication but has not responded with results of this new medication as requested.    June 15, 2018  Ricci Barker, RN      6:05 PM  Note    Patient made aware of results and agreed to start Toprol 25 mg daily. Prescription has been sent in. She will call back in a few weeks with an update.

## 2018-10-21 NOTE — Telephone Encounter (Signed)
Ok to refill. Thank you.

## 2018-12-03 ENCOUNTER — Ambulatory Visit: Payer: PPO | Admitting: Cardiovascular Disease

## 2019-01-12 ENCOUNTER — Telehealth: Payer: Self-pay

## 2019-01-12 NOTE — Telephone Encounter (Signed)
Patient was contacted to schedule a follow up  Appointment, declined an E-visit with either at this time.She is doing well no issues with medication, denies any chest pain, or SOB no edema and is staying active.  Agreed to schedule and appointment with Dr. 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 She will call back to reschedule if there is a problem.

## 2019-02-07 ENCOUNTER — Other Ambulatory Visit: Payer: Self-pay | Admitting: Internal Medicine

## 2019-03-02 ENCOUNTER — Telehealth: Payer: Self-pay | Admitting: Cardiovascular Disease

## 2019-03-02 NOTE — Telephone Encounter (Signed)

## 2019-03-03 ENCOUNTER — Encounter: Payer: Self-pay | Admitting: Cardiovascular Disease

## 2019-03-03 ENCOUNTER — Ambulatory Visit: Payer: PPO | Admitting: Cardiovascular Disease

## 2019-03-03 ENCOUNTER — Ambulatory Visit (INDEPENDENT_AMBULATORY_CARE_PROVIDER_SITE_OTHER): Payer: PPO | Admitting: Cardiovascular Disease

## 2019-03-03 ENCOUNTER — Other Ambulatory Visit: Payer: Self-pay

## 2019-03-03 VITALS — BP 130/80 | HR 80 | Temp 97.3°F | Ht 59.5 in | Wt 112.5 lb

## 2019-03-03 DIAGNOSIS — I493 Ventricular premature depolarization: Secondary | ICD-10-CM | POA: Diagnosis not present

## 2019-03-03 DIAGNOSIS — Z9889 Other specified postprocedural states: Secondary | ICD-10-CM

## 2019-03-03 NOTE — Progress Notes (Signed)
Cardiology Office Note   Date:  03/03/2019   ID:  [name], DOB 02-May-1946, MRN [id number]  PCP:  Tracie Harrier, MD  Cardiologist:   Kathlyn Sacramento, MD   Chief Complaint  Patient presents with  . office visit    12 mo F/U-PVC's      History of Present Illness: Nicole Norman is a 73 y.o. female who presents for a follow-up visit regarding mitral valve prolapse with regurgitation status post minimally invasive mitral valve repair in November 2017. Cardiac catheterization before mitral valve surgery showed no significant coronary artery disease. She has no history of diabetes, hypertension or hyperlipidemia.  She quit smoking in November.  She is known to have frequent PVCs with previous 48-hour Holter monitor showing a total of 53,000 beats representing 23% burden with short runs of nonsustained ventricular tachycardia. She was referred to Dr. Caryl Comes and has been treated with flecainide. Echocardiogram in March 2019 showed normal LV systolic function with intact mitral valve with a peak gradient of 4 mmHg and mild mitral regurgitation.  She continued to have palpitations.  48-hour Holter monitor was repeated in October 2019 which showed 16,000 PVCs representing 6% burden.  Toprol 25 mg once daily was added.  Since then, she had significant improvement in PVCs.  She is not feeling them anymore.  She denies any chest pain or shortness of breath.  Past Medical History:  Diagnosis Date  . CHF (congestive heart failure) ([place])    a. In setting of severe MR; b. 06/2016 TEE: EF 65-60%.  Marland Kitchen COPD (chronic obstructive pulmonary disease) ([place])    pt. denies  . History of bronchitis   . History of pneumonia   . Incidental pulmonary nodule 06/13/2016   Multiple small ground glass opacities seen on chest CT scan  . Migraine    1x/month - uses Aleve  . Normal coronary arteries    a. cardiac cath 06/19/2016: normal coronary arteries and left dominant system, LVEDP nl, 4+  mitral regurgitation, RHC showed mildly elevated LVEDP (4mmHg), giant V waves noted on PCWP w/ mild pulmonary HTN., pulmonary pressure 39/10 with a mean of 27 mmHg, mildly reduced cardiac output at 3.1 with a cardiac index of 2.09, recommned mitral valve repair, acceptable volume status  . Paroxysmal SVT (supraventricular tachycardia) ([place])   . PVC's (premature ventricular contractions)    a. Holter 2015 with 26,000 PVCs in 48 hours  . 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 07/02/2016 Complex valvuloplasty including quadrangular resection of posterior leaflet, folding plasty, Gore-tex neochord placement x6 and 32 mm Sorin Memo 3D ring annuloplasty with clipping of LA appendage via right mini thoracotomy approach.  . Severe mitral regurgitation    a. TTE 06/05/16: EF 60-65%, nl WM, LV dias fxn nl, mod prolapse of posterior mitral leaflet with mod eccentric regurg, LA mildly dilated, PASP nl; b. TEE 06/18/16: EF 65-70%, LV mildly dilated, no valvular vegetations seen throughout, mitral valve w/ flail motion involving the middle scallop of the post leaflet. Severe regurgitation directed eccentrically, LA mildly dilated  . Tobacco abuse   . Vertigo    no meds    Past Surgical History:  Procedure Laterality Date  . CARDIAC CATHETERIZATION N/A 06/19/2016   Procedure: Right/Left Heart Cath and Coronary Angiography;  Surgeon: Wellington Hampshire, MD;  Location: [facility];  Service: Cardiovascular;  Laterality: N/A;  . CESAREAN SECTION    . CHOLECYSTECTOMY    . CLIPPING OF ATRIAL APPENDAGE  N/A 07/02/2016   Procedure: CLIPPING OF ATRIAL APPENDAGE;  Surgeon: Rexene Alberts, MD;  Location: [facility];  Service: Open Heart Surgery;  Laterality: N/A;  . COLONOSCOPY N/A 12/07/2014   Procedure: COLONOSCOPY;  Surgeon: Lucilla Lame, MD;  Location: [facility];  Service: Gastroenterology;  Laterality: N/A;  . ESOPHAGOGASTRODUODENOSCOPY N/A 12/07/2014   Procedure:  ESOPHAGOGASTRODUODENOSCOPY (EGD);  Surgeon: Lucilla Lame, MD;  Location: [facility];  Service: Gastroenterology;  Laterality: N/A;  . EYE SURGERY Bilateral    lasic  . MITRAL VALVE REPAIR Right 07/02/2016   Procedure: MINIMALLY INVASIVE MITRAL VALVE REPAIR (MVR);  Surgeon: Rexene Alberts, MD;  Location: [facility];  Service: Open Heart Surgery;  Laterality: Right;  . TEE WITHOUT CARDIOVERSION N/A 06/18/2016   Procedure: TRANSESOPHAGEAL ECHOCARDIOGRAM (TEE);  Surgeon: Nelva Bush, MD;  Location: [facility];  Service: Cardiovascular;  Laterality: N/A;  . TEE WITHOUT CARDIOVERSION N/A 07/02/2016   Procedure: TRANSESOPHAGEAL ECHOCARDIOGRAM (TEE);  Surgeon: Rexene Alberts, MD;  Location: [facility];  Service: Open Heart Surgery;  Laterality: N/A;  . TONSILLECTOMY       Current Outpatient Medications  Medication Sig Dispense Refill  . aspirin EC 81 MG tablet Take 1 tablet (81 mg total) by mouth daily.    . calcium-vitamin D (OSCAL WITH D) 500-200 MG-UNIT tablet Take 1 tablet by mouth.    . cholecalciferol (VITAMIN D) 1000 units tablet Take 5,000 Units by mouth daily.    . flecainide (TAMBOCOR) 50 MG tablet Take 1 tablet by mouth twice daily 180 tablet 0  . metoprolol succinate (TOPROL-XL) 25 MG 24 hr tablet TAKE 1 TABLET BY MOUTH ONCE DAILY (TAKE  WITH  OR  IMMEDIATELY  FOLLOWING  A  MEAL) 90 tablet 2  . Multiple Vitamin (MULTIVITAMIN) tablet Take 1 tablet by mouth daily.    . vitamin B-12 (CYANOCOBALAMIN) 1000 MCG tablet Take 1,000 mcg by mouth daily.     No current facility-administered medications for this visit.     Allergies:   Patient has no known allergies.    Social History:  The patient  reports that she quit smoking about 2 years ago. Her smoking use included cigarettes. She has a 22.50 pack-year smoking history. She has never used smokeless tobacco. She reports current alcohol use. She reports that she does not use drugs.   Family History:  The patient's family history includes  Alzheimer's disease in her mother; Breast cancer in her sister; Breast cancer (age of onset: 52) in her sister; Cancer in her brother, brother, sister, and sister; Stroke in her father.    ROS:  Please see the history of present illness.   Otherwise, review of systems are positive for none.   All other systems are reviewed and negative.    PHYSICAL EXAM: VS:  BP 130/80 (BP Location: Left Arm, Patient Position: Sitting, Cuff Size: Normal)   Pulse 80   Temp (!) 97.3 F (36.3 C)   Ht 4' 11.5" (1.511 m)   Wt 112 lb 8 oz (51 kg)   SpO2 98%   BMI 22.34 kg/m  , BMI Body mass index is 22.34 kg/m. GEN: Well nourished, well developed, in no acute distress  HEENT: normal  Neck: no JVD, carotid bruits, or masses Cardiac: RRR ; no  Rubs, murmurs or gallops,no edema . Respiratory:  clear to auscultation bilaterally, normal work of breathing GI: soft, nontender, nondistended, + BS MS: no deformity or atrophy  Skin: warm and dry, no rash Neuro:  Strength and  sensation are intact Psych: euthymic mood, full affect   EKG:  EKG is not ordered today.    Recent Labs: No results found for requested labs within last 8760 hours.    Lipid Panel    Component Value Date/Time   CHOL 138 06/16/2016 0410   TRIG 112 06/16/2016 0410   HDL 50 06/16/2016 0410   CHOLHDL 2.8 06/16/2016 0410   VLDL 22 06/16/2016 0410   LDLCALC 66 06/16/2016 0410      Wt Readings from Last 3 Encounters:  03/03/19 112 lb 8 oz (51 kg)  05/20/18 122 lb (55.3 kg)  12/17/17 131 lb (59.4 kg)       No flowsheet data found.    ASSESSMENT AND PLAN:  1.  Mitral valve disease with mitral valve prolapse: Status post mitral valve repair in November 2017.  Most recent echo in March of 2019 showed normal functioning mitral valve with only mild regurgitation.  Ejection fraction was normal.  2. Frequent PVCs: Significant improvement since the addition of small dose Toprol.  She is also on lichenoid twice daily.  I did not  appreciate any PVCs by physical exam even with prolonged auscultation.   Disposition:   FU with me in 6 months.   Signed,  Kathlyn Sacramento, MD  03/03/2019 10:53 AM    [place]

## 2019-03-03 NOTE — Patient Instructions (Signed)
Medication Instructions:  Your physician recommends that you continue on your current medications as directed. Please refer to the Current Medication list given to you today.  If you need a refill on your cardiac medications before your next appointment, please call your pharmacy.   Lab work: None ordered If you have labs (blood work) drawn today and your tests are completely normal, you will receive your results only by: Marland Kitchen MyChart Message (if you have MyChart) OR . A paper copy in the mail If you have any lab test that is abnormal or we need to change your treatment, we will call you to review the results.  Testing/Procedures: None ordered  Follow-Up: At [facility], you and your health needs are our priority.  As part of our continuing mission to provide you with exceptional heart care, we have created designated Provider Care Teams.  These Care Teams include your primary Cardiologist (physician) and Advanced Practice Providers (APPs -  Physician Assistants and Nurse Practitioners) who all work together to provide you with the care you need, when you need it. You will need a follow up appointment in 6 months.  Please call our office 2 months in advance to schedule this appointment.  You may see Dr.Arida or one of the following Advanced Practice Providers on your designated Care Team:   Murray Hodgkins, NP Christell Faith, PA-C . Marrianne Mood, PA-C

## 2019-05-02 ENCOUNTER — Other Ambulatory Visit: Payer: Self-pay | Admitting: Internal Medicine

## 2019-08-08 ENCOUNTER — Other Ambulatory Visit: Payer: Self-pay | Admitting: Internal Medicine

## 2019-08-08 ENCOUNTER — Other Ambulatory Visit: Payer: Self-pay | Admitting: Cardiovascular Disease

## 2019-08-09 NOTE — Telephone Encounter (Signed)
This is a [place] pt 

## 2019-08-10 ENCOUNTER — Telehealth: Payer: Self-pay | Admitting: Internal Medicine

## 2019-08-10 NOTE — Telephone Encounter (Signed)
Patient has been scheduled with klein as soon as available in March

## 2019-08-10 NOTE — Telephone Encounter (Signed)
-----   Message from Horton Finer sent at 08/09/2019  1:58 PM EST ----- Please schedule overdue F/U with Dr. Caryl Comes for refills. Thank you!

## 2019-09-01 DIAGNOSIS — Z0000 Encounter for general adult medical examination without abnormal findings: Secondary | ICD-10-CM | POA: Diagnosis not present

## 2019-09-01 DIAGNOSIS — Z720 Tobacco use: Secondary | ICD-10-CM | POA: Diagnosis not present

## 2019-09-01 DIAGNOSIS — Z9889 Other specified postprocedural states: Secondary | ICD-10-CM | POA: Diagnosis not present

## 2019-09-08 DIAGNOSIS — R3129 Other microscopic hematuria: Secondary | ICD-10-CM | POA: Diagnosis not present

## 2019-09-08 DIAGNOSIS — Z9889 Other specified postprocedural states: Secondary | ICD-10-CM | POA: Diagnosis not present

## 2019-09-08 DIAGNOSIS — Z0000 Encounter for general adult medical examination without abnormal findings: Secondary | ICD-10-CM | POA: Diagnosis not present

## 2019-09-08 DIAGNOSIS — K581 Irritable bowel syndrome with constipation: Secondary | ICD-10-CM | POA: Diagnosis not present

## 2019-09-08 DIAGNOSIS — Z720 Tobacco use: Secondary | ICD-10-CM | POA: Diagnosis not present

## 2019-09-11 ENCOUNTER — Other Ambulatory Visit: Payer: Self-pay | Admitting: Internal Medicine

## 2019-09-13 ENCOUNTER — Other Ambulatory Visit: Payer: Self-pay

## 2019-09-13 ENCOUNTER — Encounter: Payer: Self-pay | Admitting: Cardiovascular Disease

## 2019-09-13 ENCOUNTER — Ambulatory Visit (INDEPENDENT_AMBULATORY_CARE_PROVIDER_SITE_OTHER): Payer: PPO

## 2019-09-13 ENCOUNTER — Ambulatory Visit (INDEPENDENT_AMBULATORY_CARE_PROVIDER_SITE_OTHER): Payer: PPO | Admitting: Cardiovascular Disease

## 2019-09-13 VITALS — BP 120/80 | HR 94 | Ht 59.5 in | Wt 112.0 lb

## 2019-09-13 DIAGNOSIS — R0602 Shortness of breath: Secondary | ICD-10-CM

## 2019-09-13 DIAGNOSIS — I059 Rheumatic mitral valve disease, unspecified: Secondary | ICD-10-CM

## 2019-09-13 DIAGNOSIS — I493 Ventricular premature depolarization: Secondary | ICD-10-CM

## 2019-09-13 NOTE — Telephone Encounter (Signed)
This is a [place] pt 

## 2019-09-13 NOTE — Progress Notes (Signed)
Cardiology Office Note   Date:  09/13/2019   ID:  Nicole Norman, DOB 01/31/1946, MRN [id number]  PCP:  Tracie Harrier, MD  Cardiologist:   Kathlyn Sacramento, MD   Chief Complaint  Patient presents with  . office visit    6 month F/U; Meds verbally reviewed with patient.      History of Present Illness: Nicole Norman is a 74 y.o. female who presents for a follow-up visit regarding mitral valve prolapse with regurgitation status post minimally invasive mitral valve repair in November 2017. Cardiac catheterization before mitral valve surgery showed no significant coronary artery disease. She has no history of diabetes, hypertension or hyperlipidemia.  She quit smoking in November.  She is known to have frequent PVCs with previous 48-hour Holter monitor showing a total of 53,000 beats representing 23% burden with short runs of nonsustained ventricular tachycardia. She was referred to Dr. Caryl Comes and has been treated with flecainide. Echocardiogram in March 2019 showed normal LV systolic function with intact mitral valve with a peak gradient of 4 mmHg and mild mitral regurgitation.  She continued to have palpitations.  48-hour Holter monitor was repeated in October 2019 which showed 16,000 PVCs representing 6% burden.  Toprol 25 mg once daily was added.  She had significant improvement in symptoms and was doing well when she was seen 6 months ago.  However, over the last 3 months she has experienced recurrent palpitations especially at night where it almost prevents her from being able to fall asleep.  In addition, this correlated with significant exertional dyspnea and recurrent episodes of dizziness and presyncope.  She had not had a syncopal episode.   Past Medical History:  Diagnosis Date  . CHF (congestive heart failure) ([place])    a. In setting of severe MR; b. 06/2016 TEE: EF 65-60%.  Marland Kitchen COPD (chronic obstructive pulmonary disease) ([place])    pt. denies  . History of  bronchitis   . History of pneumonia   . Incidental pulmonary nodule 06/13/2016   Multiple small ground glass opacities seen on chest CT scan  . Migraine    1x/month - uses Aleve  . Normal coronary arteries    a. cardiac cath 06/19/2016: normal coronary arteries and left dominant system, LVEDP nl, 4+ mitral regurgitation, RHC showed mildly elevated LVEDP (90mmHg), giant V waves noted on PCWP w/ mild pulmonary HTN., pulmonary pressure 39/10 with a mean of 27 mmHg, mildly reduced cardiac output at 3.1 with a cardiac index of 2.09, recommned mitral valve repair, acceptable volume status  . Paroxysmal SVT (supraventricular tachycardia) ([place])   . PVC's (premature ventricular contractions)    a. Holter 2015 with 26,000 PVCs in 48 hours  . 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 07/02/2016 Complex valvuloplasty including quadrangular resection of posterior leaflet, folding plasty, Gore-tex neochord placement x6 and 32 mm Sorin Memo 3D ring annuloplasty with clipping of LA appendage via right mini thoracotomy approach.  . Severe mitral regurgitation    a. TTE 06/05/16: EF 60-65%, nl WM, LV dias fxn nl, mod prolapse of posterior mitral leaflet with mod eccentric regurg, LA mildly dilated, PASP nl; b. TEE 06/18/16: EF 65-70%, LV mildly dilated, no valvular vegetations seen throughout, mitral valve w/ flail motion involving the middle scallop of the post leaflet. Severe regurgitation directed eccentrically, LA mildly dilated  . Tobacco abuse   . Vertigo    no meds    Past Surgical History:  Procedure Laterality Date  .  CARDIAC CATHETERIZATION N/A 06/19/2016   Procedure: Right/Left Heart Cath and Coronary Angiography;  Surgeon: Wellington Hampshire, MD;  Location: [place] [facility];  Service: Cardiovascular;  Laterality: N/A;  . CESAREAN SECTION    . CHOLECYSTECTOMY    . CLIPPING OF ATRIAL APPENDAGE N/A 07/02/2016   Procedure: CLIPPING OF ATRIAL APPENDAGE;  Surgeon: Rexene Alberts,  MD;  Location: [place];  Service: Open Heart Surgery;  Laterality: N/A;  . COLONOSCOPY N/A 12/07/2014   Procedure: COLONOSCOPY;  Surgeon: Lucilla Lame, MD;  Location: [place];  Service: Gastroenterology;  Laterality: N/A;  . ESOPHAGOGASTRODUODENOSCOPY N/A 12/07/2014   Procedure: ESOPHAGOGASTRODUODENOSCOPY (EGD);  Surgeon: Lucilla Lame, MD;  Location: [place];  Service: Gastroenterology;  Laterality: N/A;  . EYE SURGERY Bilateral    lasic  . MITRAL VALVE REPAIR Right 07/02/2016   Procedure: MINIMALLY INVASIVE MITRAL VALVE REPAIR (MVR);  Surgeon: Rexene Alberts, MD;  Location: [place];  Service: Open Heart Surgery;  Laterality: Right;  . TEE WITHOUT CARDIOVERSION N/A 06/18/2016   Procedure: TRANSESOPHAGEAL ECHOCARDIOGRAM (TEE);  Surgeon: Nelva Bush, MD;  Location: [facility];  Service: Cardiovascular;  Laterality: N/A;  . TEE WITHOUT CARDIOVERSION N/A 07/02/2016   Procedure: TRANSESOPHAGEAL ECHOCARDIOGRAM (TEE);  Surgeon: Rexene Alberts, MD;  Location: [place];  Service: Open Heart Surgery;  Laterality: N/A;  . TONSILLECTOMY       Current Outpatient Medications  Medication Sig Dispense Refill  . ascorbic acid (VITAMIN C) 500 MG tablet Take 500 mg by mouth daily.    Marland Kitchen aspirin EC 81 MG tablet Take 1 tablet (81 mg total) by mouth daily.    . calcium-vitamin D (OSCAL WITH D) 500-200 MG-UNIT tablet Take 1 tablet by mouth.    . cholecalciferol (VITAMIN D) 1000 units tablet Take 5,000 Units by mouth daily.    . flecainide (TAMBOCOR) 50 MG tablet Take 1 tablet (50 mg total) by mouth 2 (two) times daily. 60 tablet 0  . metoprolol succinate (TOPROL-XL) 25 MG 24 hr tablet TAKE 1 TABLET BY MOUTH ONCE DAILY (TAKE  WITH  OR  IMMEDIATELY  FOLLOWING  A  MEAL) 90 tablet 0  . Multiple Vitamin (MULTIVITAMIN) tablet Take 1 tablet by mouth daily.    . vitamin B-12 (CYANOCOBALAMIN) 1000 MCG tablet Take 1,000 mcg by mouth daily.     No current facility-administered medications for this visit.     Allergies:   Patient has no known allergies.    Social History:  The patient  reports that she quit smoking about 3 years ago. Her smoking use included cigarettes. She has a 22.50 pack-year smoking history. She has never used smokeless tobacco. She reports current alcohol use. She reports that she does not use drugs.   Family History:  The patient's family history includes Alzheimer's disease in her mother; Breast cancer in her sister; Breast cancer (age of onset: 19) in her sister; Cancer in her brother, brother, sister, and sister; Stroke in her father.    ROS:  Please see the history of present illness.   Otherwise, review of systems are positive for none.   All other systems are reviewed and negative.    PHYSICAL EXAM: VS:  BP 120/80 (BP Location: Left Arm, Patient Position: Sitting, Cuff Size: Normal)   Pulse 94   Ht 4' 11.5" (1.511 m)   Wt 112 lb (50.8 kg)   SpO2 98%   BMI 22.24 kg/m  , BMI Body mass index is 22.24 kg/m. GEN: Well nourished, well  developed, in no acute distress  HEENT: normal  Neck: no JVD, carotid bruits, or masses Cardiac: RRR with frequent premature beats; no  Rubs, murmurs or gallops,no edema . Respiratory:  clear to auscultation bilaterally, normal work of breathing GI: soft, nontender, nondistended, + BS MS: no deformity or atrophy  Skin: warm and dry, no rash Neuro:  Strength and sensation are intact Psych: euthymic mood, full affect   EKG:  EKG is ordered today. EKG showed sinus rhythm with frequent PVCs . 2 PVCs are happening after a normal beat.   Recent Labs: No results found for requested labs within last 8760 hours.    Lipid Panel    Component Value Date/Time   CHOL 138 06/16/2016 0410   TRIG 112 06/16/2016 0410   HDL 50 06/16/2016 0410   CHOLHDL 2.8 06/16/2016 0410   VLDL 22 06/16/2016 0410   LDLCALC 66 06/16/2016 0410      Wt Readings from Last 3 Encounters:  09/13/19 112 lb (50.8 kg)  03/03/19 112 lb 8 oz (51 kg)   05/20/18 122 lb (55.3 kg)       No flowsheet data found.    ASSESSMENT AND PLAN:  1.  Mitral valve disease with mitral valve prolapse: Status post mitral valve repair in November 2017.  Most recent echo in March of 2019 showed normal functioning mitral valve with only mild regurgitation.  Given significant worsening of exertional dyspnea and PVCs, I requested a follow-up echocardiogram  2. Frequent PVCs: Symptoms were controlled on flecainide and Toprol up until 3 months ago when she started having severe palpitations with associated presyncope and significant worsening of exertional dyspnea.  I am going to obtain a 2-week Zio patch monitor.  I reviewed her labs done with her primary care physician from last month.  CBC and basic metabolic profile were unremarkable.  Potassium was 4.5.  Thyroid function was normal.  She does not consume excessive amount of caffeine. She continues to be on flecainide.  I will discuss with Dr. Caryl Comes regarding increasing the dose.  She has a follow-up appointment with him next month.  Ablation should be considered.   Disposition:   Follow-up with Dr. Caryl Comes next month.  FU with me in 6 months.   Signed,  Kathlyn Sacramento, MD  09/13/2019 3:59 PM    [facility]

## 2019-09-13 NOTE — Patient Instructions (Signed)
Medication Instructions:  Your physician recommends that you continue on your current medications as directed. Please refer to the Current Medication list given to you today.  *If you need a refill on your cardiac medications before your next appointment, please call your pharmacy*  Lab Work: None ordered If you have labs (blood work) drawn today and your tests are completely normal, you will receive your results only by: Marland Kitchen MyChart Message (if you have MyChart) OR . A paper copy in the mail If you have any lab test that is abnormal or we need to change your treatment, we will call you to review the results.  Testing/Procedures: Your physician has requested that you have an echocardiogram. Echocardiography is a painless test that uses sound waves to create images of your heart. It provides your doctor with information about the size and shape of your heart and how well your heart's chambers and valves are working. This procedure takes approximately one hour. There are no restrictions for this procedure.  Your physician has recommended that you wear a zio monitor. Zio monitors are medical devices that record the heart's electrical activity. Doctors most often [place] these monitors to diagnose arrhythmias. Arrhythmias are problems with the speed or rhythm of the heartbeat. The monitor is a small, portable device. You can wear one while you do your normal daily activities. This is usually used to diagnose what is causing palpitations/syncope (passing out).    Follow-Up: At [facility], you and your health needs are our priority.  As part of our continuing mission to provide you with exceptional heart care, we have created designated Provider Care Teams.  These Care Teams include your primary Cardiologist (physician) and Advanced Practice Providers (APPs -  Physician Assistants and Nurse Practitioners) who all work together to provide you with the care you need, when you need it.  Your next  appointment:   6 month(s)  The format for your next appointment:   In Person  Provider:    You may see Dr. Fletcher Anon or one of the following Advanced Practice Providers on your designated Care Team:    Murray Hodgkins, NP  Christell Faith, PA-C  Marrianne Mood, PA-C   Follow up with Dr. Caryl Comes as planned   Other Instructions Your physician has recommended that you wear a Zio monitor. This monitor is a medical device that records the heart's electrical activity. Doctors most often use these monitors to diagnose arrhythmias. Arrhythmias are problems with the speed or rhythm of the heartbeat. The monitor is a small device applied to your chest. You can wear one while you do your normal daily activities. While wearing this monitor if you have any symptoms to push the button and record what you felt. Once you have worn this monitor for the period of time provider prescribed (Usually 14 days), you will return the monitor device in the postage paid box. Once it is returned they will download the data collected and provide [place] with a report which the provider will then review and we will call you with those results. Important tips:  1. Avoid showering during the first 24 hours of wearing the monitor. 2. Avoid excessive sweating to help maximize wear time. 3. Do not submerge the device, no hot tubs, and no swimming pools. 4. Keep any lotions or oils away from the patch. 5. After 24 hours you may shower with the patch on. Take brief showers with your back facing the shower head.  6. Do not remove patch once  it has been placed because that will interrupt data and decrease adhesive wear time. 7. Push the button when you have any symptoms and write down what you were feeling. 8. Once you have completed wearing your monitor, remove and place into box which has postage paid and place in your outgoing mailbox.  9. If for some reason you have misplaced your box then call our office and we can provide another  box and/or mail it off for you.

## 2019-09-14 ENCOUNTER — Telehealth: Payer: Self-pay | Admitting: Cardiovascular Disease

## 2019-09-14 NOTE — Telephone Encounter (Signed)
I spoke with the patient. She states she received a text message yesterday from [employer] stating asking if she would like to receive some type of text notifications from them- that message and data rates may apply.   I advised the patient I have never heard anyone mention that - I advised she call [employer] at the 800# on her booklet just to inquire.  She was agreeable.  The patient states she was also scheduled for her echo- ordered by Dr. Fletcher Anon, but this is to be done on 3/12 and she sees Dr. Caryl Comes on 3/11.  I advised it would be helpful to have the echo prior to her appointment with Dr. Caryl Comes so he would know what her EF is in light of her PVC's. The patient is aware I will ask scheduling to reach out to her to r/s her echo or her Dr. Caryl Comes appt so her echo is done prior to seeing him.  The patient voiced understanding of the above and was agreeable.

## 2019-09-14 NOTE — Telephone Encounter (Signed)
Patient would like to discuss her monitor. States she received a "text from the monitor people, and need it explained".

## 2019-10-03 ENCOUNTER — Other Ambulatory Visit: Payer: Self-pay | Admitting: Internal Medicine

## 2019-10-05 DIAGNOSIS — R002 Palpitations: Secondary | ICD-10-CM | POA: Diagnosis not present

## 2019-10-06 ENCOUNTER — Other Ambulatory Visit: Payer: Self-pay | Admitting: Internal Medicine

## 2019-10-06 DIAGNOSIS — Z1231 Encounter for screening mammogram for malignant neoplasm of breast: Secondary | ICD-10-CM

## 2019-10-12 ENCOUNTER — Ambulatory Visit (INDEPENDENT_AMBULATORY_CARE_PROVIDER_SITE_OTHER): Payer: PPO

## 2019-10-12 ENCOUNTER — Other Ambulatory Visit: Payer: Self-pay

## 2019-10-12 DIAGNOSIS — I059 Rheumatic mitral valve disease, unspecified: Secondary | ICD-10-CM | POA: Diagnosis not present

## 2019-10-12 DIAGNOSIS — R0602 Shortness of breath: Secondary | ICD-10-CM | POA: Diagnosis not present

## 2019-10-13 ENCOUNTER — Ambulatory Visit (INDEPENDENT_AMBULATORY_CARE_PROVIDER_SITE_OTHER): Payer: PPO | Admitting: Internal Medicine

## 2019-10-13 ENCOUNTER — Encounter: Payer: Self-pay | Admitting: Internal Medicine

## 2019-10-13 VITALS — BP 108/64 | HR 85 | Ht 59.5 in | Wt 110.4 lb

## 2019-10-13 DIAGNOSIS — I471 Supraventricular tachycardia: Secondary | ICD-10-CM | POA: Diagnosis not present

## 2019-10-13 DIAGNOSIS — I493 Ventricular premature depolarization: Secondary | ICD-10-CM | POA: Diagnosis not present

## 2019-10-13 DIAGNOSIS — I059 Rheumatic mitral valve disease, unspecified: Secondary | ICD-10-CM | POA: Diagnosis not present

## 2019-10-13 MED ORDER — FLECAINIDE ACETATE 50 MG PO TABS: 50.0000 mg | ORAL_TABLET | Freq: Three times a day (TID) | ORAL | 6 refills | Status: DC

## 2019-10-13 NOTE — Progress Notes (Signed)
Patient Care Team: Tracie Harrier, MD as PCP - General (Internal Medicine)   HPI  Nicole Norman is a 74 y.o. female Seen in followup for VT and PVCs in the context of prior Mitral valve repair ( 07/02/2016 Complex valvuloplasty including quadrangular resection of posterior leaflet, folding plasty, Gore-tex neochord placement x6 and 32 mm Sorin Memo 3D ring annuloplasty with clipping of LA appendage  )  for severe MR.   She was started on flecainide and with that she noted a significant reduction in her palpitations and fatigue.   Unfortunately, QRS with the PR interval lengthened considerably on flecainide 100 twice daily and down titration to 50 was necessary.  Subsequently, she has had increasing frequency of her spells.  These are characterized both by palpitations as well as episodes profound weakness and lightheadedness accompanied by residual fatigue.  These are now occurring a couple times a week does not recall having any of them while wearing her monitor  A patient triggered event was associated with trigeminy  DATE TEST EF   11/17 Cath  normal % 4+ MR/LAE Severe  12/17 Echo   55-60 %   3/19 Echo  55-65%   3/21 Echo  55-60%    Date PVCs  10/19 6% (Holter)  3/21 10.5% (ZIO)            DATE PR interval QRSduration Flecainide  7/18  182 80 0  9/18 208 114 100  3/19 246 110 100  5/19 180 104 50  1/21 162 82 50      Past Medical History:  Diagnosis Date  . CHF (congestive heart failure) ([place])    a. In setting of severe MR; b. 06/2016 TEE: EF 65-60%.  Marland Kitchen COPD (chronic obstructive pulmonary disease) ([place])    pt. denies  . History of bronchitis   . History of pneumonia   . Incidental pulmonary nodule 06/13/2016   Multiple small ground glass opacities seen on chest CT scan  . Migraine    1x/month - uses Aleve  . Normal coronary arteries    a. cardiac cath 06/19/2016: normal coronary arteries and left dominant system, LVEDP nl, 4+ mitral  regurgitation, RHC showed mildly elevated LVEDP (57mmHg), giant V waves noted on PCWP w/ mild pulmonary HTN., pulmonary pressure 39/10 with a mean of 27 mmHg, mildly reduced cardiac output at 3.1 with a cardiac index of 2.09, recommned mitral valve repair, acceptable volume status  . Paroxysmal SVT (supraventricular tachycardia) ([place])   . PVC's (premature ventricular contractions)    a. Holter 2015 with 26,000 PVCs in 48 hours  . 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 07/02/2016 Complex valvuloplasty including quadrangular resection of posterior leaflet, folding plasty, Gore-tex neochord placement x6 and 32 mm Sorin Memo 3D ring annuloplasty with clipping of LA appendage via right mini thoracotomy approach.  . Severe mitral regurgitation    a. TTE 06/05/16: EF 60-65%, nl WM, LV dias fxn nl, mod prolapse of posterior mitral leaflet with mod eccentric regurg, LA mildly dilated, PASP nl; b. TEE 06/18/16: EF 65-70%, LV mildly dilated, no valvular vegetations seen throughout, mitral valve w/ flail motion involving the middle scallop of the post leaflet. Severe regurgitation directed eccentrically, LA mildly dilated  . Tobacco abuse   . Vertigo    no meds    Past Surgical History:  Procedure Laterality Date  . CARDIAC CATHETERIZATION N/A 06/19/2016   Procedure: Right/Left Heart Cath and Coronary Angiography;  Surgeon: Rogue Jury  Ferne Reus, MD;  Location: [place] [facility];  Service: Cardiovascular;  Laterality: N/A;  . CESAREAN SECTION    . CHOLECYSTECTOMY    . CLIPPING OF ATRIAL APPENDAGE N/A 07/02/2016   Procedure: CLIPPING OF ATRIAL APPENDAGE;  Surgeon: Rexene Alberts, MD;  Location: [place];  Service: Open Heart Surgery;  Laterality: N/A;  . COLONOSCOPY N/A 12/07/2014   Procedure: COLONOSCOPY;  Surgeon: Lucilla Lame, MD;  Location: [employer];  Service: Gastroenterology;  Laterality: N/A;  . ESOPHAGOGASTRODUODENOSCOPY N/A 12/07/2014   Procedure: ESOPHAGOGASTRODUODENOSCOPY  (EGD);  Surgeon: Lucilla Lame, MD;  Location: [place];  Service: Gastroenterology;  Laterality: N/A;  . EYE SURGERY Bilateral    lasic  . MITRAL VALVE REPAIR Right 07/02/2016   Procedure: MINIMALLY INVASIVE MITRAL VALVE REPAIR (MVR);  Surgeon: Rexene Alberts, MD;  Location: [place];  Service: Open Heart Surgery;  Laterality: Right;  . TEE WITHOUT CARDIOVERSION N/A 06/18/2016   Procedure: TRANSESOPHAGEAL ECHOCARDIOGRAM (TEE);  Surgeon: Nelva Bush, MD;  Location: [facility];  Service: Cardiovascular;  Laterality: N/A;  . TEE WITHOUT CARDIOVERSION N/A 07/02/2016   Procedure: TRANSESOPHAGEAL ECHOCARDIOGRAM (TEE);  Surgeon: Rexene Alberts, MD;  Location: [place];  Service: Open Heart Surgery;  Laterality: N/A;  . TONSILLECTOMY      Current Outpatient Medications  Medication Sig Dispense Refill  . ascorbic acid (VITAMIN C) 500 MG tablet Take 500 mg by mouth daily.    Marland Kitchen aspirin EC 81 MG tablet Take 1 tablet (81 mg total) by mouth daily.    . calcium-vitamin D (OSCAL WITH D) 500-200 MG-UNIT tablet Take 1 tablet by mouth.    . cholecalciferol (VITAMIN D) 1000 units tablet Take 5,000 Units by mouth daily.    . flecainide (TAMBOCOR) 50 MG tablet Take 1 tablet by mouth twice daily 60 tablet 0  . metoprolol succinate (TOPROL-XL) 25 MG 24 hr tablet TAKE 1 TABLET BY MOUTH ONCE DAILY (TAKE  WITH  OR  IMMEDIATELY  FOLLOWING  A  MEAL) 90 tablet 0  . Multiple Vitamin (MULTIVITAMIN) tablet Take 1 tablet by mouth daily.    . vitamin B-12 (CYANOCOBALAMIN) 1000 MCG tablet Take 1,000 mcg by mouth daily.     No current facility-administered medications for this visit.    No Known Allergies    Review of Systems negative except from HPI and PMH  Physical Exam BP 108/64 (BP Location: Left Arm, Patient Position: Sitting, Cuff Size: Normal)   Pulse 85   Ht 4' 11.5" (1.511 m)   Wt 110 lb 6 oz (50.1 kg)   SpO2 96%   BMI 21.92 kg/m  Well developed and nourished in no acute distress HENT  normal Neck supple with JVP-flat Clear Regular rate and rhythm, no murmurs or gallops Abd-soft with active BS No Clubbing cyanosis edema Skin-warm and dry A & Oriented  Grossly normal sensory and motor function  While talking to her she had one of her spells.  Taking her pulse in her wrist I found it irregular.  By the time I started auscultating her rate was regular.  Her symptoms abated.  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  She was unable  to tolerate flecainide 100 mg twice daily because of accentuated PR prolongation and QRS widening.  Hence, would like to put her on 150 mg a day.  Unable to split the 50s, we will go to 50 every 8.  If this is insufficient, adjunctive therapy with ranolazine, alternative drug therapy or referral for catheter ablation would be next  Clinically orthostasis is quiet. We spent more than 50% of our >3 5 min visit in face to face counseling regarding the above

## 2019-10-13 NOTE — Patient Instructions (Signed)
Medication Instructions:  - Your physician has recommended you make the following change in your medication:   1) Increase flecainide 50 mg- take 1 tablet by mouth every 8 hours  *If you need a refill on your cardiac medications before your next appointment, please call your pharmacy*   Lab Work: - none ordered  If you have labs (blood work) drawn today and your tests are completely normal, you will receive your results only by: Marland Kitchen MyChart Message (if you have MyChart) OR . A paper copy in the mail If you have any lab test that is abnormal or we need to change your treatment, we will call you to review the results.   Testing/Procedures: - none ordered   Follow-Up: At [facility], you and your health needs are our priority.  As part of our continuing mission to provide you with exceptional heart care, we have created designated Provider Care Teams.  These Care Teams include your primary Cardiologist (physician) and Advanced Practice Providers (APPs -  Physician Assistants and Nurse Practitioners) who all work together to provide you with the care you need, when you need it.  We recommend signing up for the patient portal called "MyChart".  Sign up information is provided on this After Visit Summary.  MyChart is used to connect with patients for Virtual Visits (Telemedicine).  Patients are able to view lab/test results, encounter notes, upcoming appointments, etc.  Non-urgent messages can be sent to your provider as well.   To learn more about what you can do with MyChart, go to [email or web address].    Your next appointment:   2-3 weeks   The format for your next appointment:   Virtual Visit   Provider:   Virl Axe, MD   Other Instructions N/a

## 2019-10-14 ENCOUNTER — Other Ambulatory Visit: Payer: PPO

## 2019-10-28 ENCOUNTER — Ambulatory Visit
Admission: RE | Admit: 2019-10-28 | Discharge: 2019-10-28 | Disposition: A | Discharge status: Home / Self Care | Payer: PPO | Source: Ambulatory Visit | Attending: Internal Medicine | Admitting: Internal Medicine

## 2019-10-28 ENCOUNTER — Other Ambulatory Visit: Payer: Self-pay | Admitting: Internal Medicine

## 2019-10-28 DIAGNOSIS — Z1231 Encounter for screening mammogram for malignant neoplasm of breast: Secondary | ICD-10-CM | POA: Diagnosis not present

## 2019-11-01 ENCOUNTER — Telehealth: Payer: Self-pay | Admitting: Internal Medicine

## 2019-11-01 MED ORDER — FLECAINIDE ACETATE 50 MG PO TABS: 50.0000 mg | ORAL_TABLET | Freq: Three times a day (TID) | ORAL | 3 refills | Status: DC

## 2019-11-01 NOTE — Addendum Note (Signed)
Addended by: Britt Bottom on: 11/01/2019 09:30 AM   Modules accepted: Orders

## 2019-11-01 NOTE — Telephone Encounter (Signed)
°  Patient Consent for Virtual Visit         Nicole Norman has provided verbal consent on 11/01/2019 for a virtual visit (video or telephone).   CONSENT FOR VIRTUAL VISIT FOR:  Nicole Norman  By participating in this virtual visit I agree to the following:  I hereby voluntarily request, consent and authorize [facility] and its employed or contracted physicians, physician assistants, nurse practitioners or other licensed health care professionals (the Practitioner), to provide me with telemedicine health care services (the Services") as deemed necessary by the treating Practitioner. I acknowledge and consent to receive the Services by the Practitioner via telemedicine. I understand that the telemedicine visit will involve communicating with the Practitioner through live audiovisual communication technology and the disclosure of certain medical information by electronic transmission. I acknowledge that I have been given the opportunity to request an in-person assessment or other available alternative prior to the telemedicine visit and am voluntarily participating in the telemedicine visit.  I understand that I have the right to withhold or withdraw my consent to the use of telemedicine in the course of my care at any time, without affecting my right to future care or treatment, and that the Practitioner or I may terminate the telemedicine visit at any time. I understand that I have the right to inspect all information obtained and/or recorded in the course of the telemedicine visit and may receive copies of available information for a reasonable fee.  I understand that some of the potential risks of receiving the Services via telemedicine include:   Delay or interruption in medical evaluation due to technological equipment failure or disruption;  Information transmitted may not be sufficient (e.g. poor resolution of images) to allow for appropriate medical decision making by the  Practitioner; and/or   In rare instances, security protocols could fail, causing a breach of personal health information.  Furthermore, I acknowledge that it is my responsibility to provide information about my medical history, conditions and care that is complete and accurate to the best of my ability. I acknowledge that Practitioner's advice, recommendations, and/or decision may be based on factors not within their control, such as incomplete or inaccurate data provided by me or distortions of diagnostic images or specimens that may result from electronic transmissions. I understand that the practice of medicine is not an exact science and that Practitioner makes no warranties or guarantees regarding treatment outcomes. I acknowledge that a copy of this consent can be made available to me via my patient portal ([facility]), or I can request a printed copy by calling the office of [facility].    I understand that my insurance will be billed for this visit.   I have read or had this consent read to me.  I understand the contents of this consent, which adequately explains the benefits and risks of the Services being provided via telemedicine.   I have been provided ample opportunity to ask questions regarding this consent and the Services and have had my questions answered to my satisfaction.  I give my informed consent for the services to be provided through the use of telemedicine in my medical care

## 2019-11-03 ENCOUNTER — Telehealth (INDEPENDENT_AMBULATORY_CARE_PROVIDER_SITE_OTHER): Payer: PPO | Admitting: Internal Medicine

## 2019-11-03 ENCOUNTER — Other Ambulatory Visit: Payer: Self-pay

## 2019-11-03 VITALS — Ht 59.5 in | Wt 112.0 lb

## 2019-11-03 DIAGNOSIS — I493 Ventricular premature depolarization: Secondary | ICD-10-CM

## 2019-11-03 DIAGNOSIS — I471 Supraventricular tachycardia: Secondary | ICD-10-CM

## 2019-11-03 DIAGNOSIS — I059 Rheumatic mitral valve disease, unspecified: Secondary | ICD-10-CM

## 2019-11-03 DIAGNOSIS — Z79899 Other long term (current) drug therapy: Secondary | ICD-10-CM

## 2019-11-03 NOTE — Patient Instructions (Signed)
Medication Instructions:  - Your physician recommends that you continue on your current medications as directed. Please refer to the Current Medication list given to you today.  *If you need a refill on your cardiac medications before your next appointment, please call your pharmacy*   Lab Work: - none ordered  If you have labs (blood work) drawn today and your tests are completely normal, you will receive your results only by: Marland Kitchen MyChart Message (if you have MyChart) OR . A paper copy in the mail If you have any lab test that is abnormal or we need to change your treatment, we will call you to review the results.   Testing/Procedures: - EKG at the [facility] on Wednesday 11/09/19 (arrive at 8:45 am) - check in at the 1st desk on the right past the screening table   Follow-Up: At [facility], you and your health needs are our priority.  As part of our continuing mission to provide you with exceptional heart care, we have created designated Provider Care Teams.  These Care Teams include your primary Cardiologist (physician) and Advanced Practice Providers (APPs -  Physician Assistants and Nurse Practitioners) who all work together to provide you with the care you need, when you need it.  We recommend signing up for the patient portal called "MyChart".  Sign up information is provided on this After Visit Summary.  MyChart is used to connect with patients for Virtual Visits (Telemedicine).  Patients are able to view lab/test results, encounter notes, upcoming appointments, etc.  Non-urgent messages can be sent to your provider as well.   To learn more about what you can do with MyChart, go to [email or web address].    Your next appointment:   6 month(s)  The format for your next appointment:   In Person  Provider:   Virl Axe, MD   Other Instructions n/a

## 2019-11-03 NOTE — Progress Notes (Signed)
Electrophysiology TeleHealth Note   Due to national recommendations of social distancing due to COVID 19, an audio/video telehealth visit is felt to be most appropriate for this patient at this time.  See MyChart message from today for the patient's consent to telehealth for [facility].   Date:  11/03/2019   ID:  Nicole Norman, DOB 1946-02-27, MRN [id number]  Location: patient's home  Provider location: [address], [place]  Evaluation Performed: Follow-up visit  PCP:  Tracie Harrier, MD  Cardiologist:   MA  Electrophysiologist:  SK   Chief Complaint:  Palpitations   History of Present Illness:    Nicole Norman is a 74 y.o. female who presents via audio/video conferencing for a telehealth visit today.  Since last being seen in our clinic for followup for VT and PVCs in the context of prior Mitral valve repair ( 07/02/2016 Complex valvuloplasty including quadrangular resection of posterior leaflet, folding plasty, Gore-tex neochord placement x6 and 32 mm Sorin Memo 3D ring annuloplasty with clipping of LA appendage  )  for severe MR.   She was started on flecainide and with that she noted a significant reduction in her palpitations and fatigue.   Unfortunately, QRS with the PR interval lengthened considerably on flecainide 100 twice daily and down titration to 50 was necessary.  Subsequently, she has had increasing frequency of her spells.  These are characterized both by palpitations as well as episodes profound weakness and lightheadedness accompanied by residual fatigue.    Her dose was increased to 75 bid; she last being seen shi reports doing much better with only one interval episode of palpitations  No dizziness with drug, and ow The patient denies chest pain, shortness of breath, nocturnal dyspnea, orthopnea or peripheral edema.  There have been no lightheadedness or syncope.       DATE TEST EF   11/17 Cath  normal % 4+ MR/LAE  Severe  12/17 Echo   55-60 %   3/19 Echo  55-65%   3/21 Echo  55-60% MR none; LA  normal   Date PVCs  10/19 6% (Holter)  3/21 10.5% (ZIO)            DATE PR interval QRSduration Flecainide  7/18  182 80 0  9/18 208 114 100  3/19 246 110 100  5/19 180 104 50  1/21 162 82 50           Past Medical History:  Diagnosis Date  . CHF (congestive heart failure) ([place])    a. In setting of severe MR; b. 06/2016 TEE: EF 65-60%.  Marland Kitchen COPD (chronic obstructive pulmonary disease) ([facility])    pt. denies  . History of bronchitis   . History of pneumonia   . Incidental pulmonary nodule 06/13/2016   Multiple small ground glass opacities seen on chest CT scan  . Migraine    1x/month - uses Aleve  . Normal coronary arteries    a. cardiac cath 06/19/2016: normal coronary arteries and left dominant system, LVEDP nl, 4+ mitral regurgitation, RHC showed mildly elevated LVEDP (4mmHg), giant V waves noted on PCWP w/ mild pulmonary HTN., pulmonary pressure 39/10 with a mean of 27 mmHg, mildly reduced cardiac output at 3.1 with a cardiac index of 2.09, recommned mitral valve repair, acceptable volume status  . Paroxysmal SVT (supraventricular tachycardia) ([place])   . PVC's (premature ventricular contractions)    a. Holter 2015 with 26,000 PVCs in 48 hours  .  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 07/02/2016 Complex valvuloplasty including quadrangular resection of posterior leaflet, folding plasty, Gore-tex neochord placement x6 and 32 mm Sorin Memo 3D ring annuloplasty with clipping of LA appendage via right mini thoracotomy approach.  . Severe mitral regurgitation    a. TTE 06/05/16: EF 60-65%, nl WM, LV dias fxn nl, mod prolapse of posterior mitral leaflet with mod eccentric regurg, LA mildly dilated, PASP nl; b. TEE 06/18/16: EF 65-70%, LV mildly dilated, no valvular vegetations seen throughout, mitral valve w/ flail motion involving the middle scallop of the post leaflet. Severe  regurgitation directed eccentrically, LA mildly dilated  . Tobacco abuse   . Vertigo    no meds    Past Surgical History:  Procedure Laterality Date  . CARDIAC CATHETERIZATION N/A 06/19/2016   Procedure: Right/Left Heart Cath and Coronary Angiography;  Surgeon: Wellington Hampshire, MD;  Location: [facility];  Service: Cardiovascular;  Laterality: N/A;  . CESAREAN SECTION    . CHOLECYSTECTOMY    . CLIPPING OF ATRIAL APPENDAGE N/A 07/02/2016   Procedure: CLIPPING OF ATRIAL APPENDAGE;  Surgeon: Rexene Alberts, MD;  Location: [facility];  Service: Open Heart Surgery;  Laterality: N/A;  . COLONOSCOPY N/A 12/07/2014   Procedure: COLONOSCOPY;  Surgeon: Lucilla Lame, MD;  Location: [facility];  Service: Gastroenterology;  Laterality: N/A;  . ESOPHAGOGASTRODUODENOSCOPY N/A 12/07/2014   Procedure: ESOPHAGOGASTRODUODENOSCOPY (EGD);  Surgeon: Lucilla Lame, MD;  Location: [facility] [place];  Service: Gastroenterology;  Laterality: N/A;  . EYE SURGERY Bilateral    lasic  . MITRAL VALVE REPAIR Right 07/02/2016   Procedure: MINIMALLY INVASIVE MITRAL VALVE REPAIR (MVR);  Surgeon: Rexene Alberts, MD;  Location: [facility];  Service: Open Heart Surgery;  Laterality: Right;  . TEE WITHOUT CARDIOVERSION N/A 06/18/2016   Procedure: TRANSESOPHAGEAL ECHOCARDIOGRAM (TEE);  Surgeon: Nelva Bush, MD;  Location: [facility];  Service: Cardiovascular;  Laterality: N/A;  . TEE WITHOUT CARDIOVERSION N/A 07/02/2016   Procedure: TRANSESOPHAGEAL ECHOCARDIOGRAM (TEE);  Surgeon: Rexene Alberts, MD;  Location: [facility];  Service: Open Heart Surgery;  Laterality: N/A;  . TONSILLECTOMY      Current Outpatient Medications  Medication Sig Dispense Refill  . ascorbic acid (VITAMIN C) 500 MG tablet Take 500 mg by mouth daily.    Marland Kitchen aspirin EC 81 MG tablet Take 1 tablet (81 mg total) by mouth daily.    . calcium-vitamin D (OSCAL WITH D) 500-200 MG-UNIT tablet Take 1 tablet by mouth.    . cholecalciferol (VITAMIN D) 1000  units tablet Take 5,000 Units by mouth daily.    . flecainide (TAMBOCOR) 50 MG tablet Take 1 tablet (50 mg total) by mouth 3 (three) times daily. 270 tablet 3  . metoprolol succinate (TOPROL-XL) 25 MG 24 hr tablet TAKE 1 TABLET BY MOUTH ONCE DAILY (TAKE  WITH  OR  IMMEDIATELY  FOLLOWING  A  MEAL) 90 tablet 0  . Multiple Vitamin (MULTIVITAMIN) tablet Take 1 tablet by mouth daily.    . vitamin B-12 (CYANOCOBALAMIN) 1000 MCG tablet Take 1,000 mcg by mouth daily.     No current facility-administered medications for this visit.    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   The patient does not have concerns regarding her medicines.  The following changes were made today:  none  Labs/ tests ordered today include: ECG at next convenience No orders of the defined types were placed in this encounter.      Patient Risk:  after full review of this patients clinical status, I feel that they are at moderate  risk at this time.  Today, I have spent 4 minutes with the patient with telehealth technology discussing the above.

## 2019-11-09 ENCOUNTER — Ambulatory Visit
Admission: RE | Admit: 2019-11-09 | Discharge: 2019-11-09 | Disposition: A | Discharge status: Home / Self Care | Payer: PPO | Source: Ambulatory Visit | Attending: Internal Medicine | Admitting: Internal Medicine

## 2019-11-09 ENCOUNTER — Other Ambulatory Visit: Payer: Self-pay

## 2019-11-09 DIAGNOSIS — I471 Supraventricular tachycardia: Secondary | ICD-10-CM | POA: Diagnosis not present

## 2019-11-09 DIAGNOSIS — Z79899 Other long term (current) drug therapy: Secondary | ICD-10-CM | POA: Insufficient documentation

## 2019-11-09 DIAGNOSIS — I493 Ventricular premature depolarization: Secondary | ICD-10-CM | POA: Diagnosis not present

## 2019-11-09 DIAGNOSIS — R54 Age-related physical debility: Secondary | ICD-10-CM | POA: Diagnosis not present

## 2019-11-27 ENCOUNTER — Other Ambulatory Visit: Payer: Self-pay | Admitting: Cardiovascular Disease

## 2020-02-02 DIAGNOSIS — H2513 Age-related nuclear cataract, bilateral: Secondary | ICD-10-CM | POA: Diagnosis not present

## 2020-02-02 DIAGNOSIS — H43399 Other vitreous opacities, unspecified eye: Secondary | ICD-10-CM | POA: Diagnosis not present

## 2020-02-02 DIAGNOSIS — H43812 Vitreous degeneration, left eye: Secondary | ICD-10-CM | POA: Diagnosis not present

## 2020-02-23 ENCOUNTER — Other Ambulatory Visit: Payer: Self-pay | Admitting: Cardiovascular Disease

## 2020-03-13 ENCOUNTER — Ambulatory Visit: Payer: PPO | Admitting: Cardiovascular Disease

## 2020-05-10 ENCOUNTER — Encounter: Payer: Self-pay | Admitting: Internal Medicine

## 2020-05-10 ENCOUNTER — Ambulatory Visit (INDEPENDENT_AMBULATORY_CARE_PROVIDER_SITE_OTHER): Payer: PPO | Admitting: Internal Medicine

## 2020-05-10 ENCOUNTER — Other Ambulatory Visit: Payer: Self-pay

## 2020-05-10 VITALS — BP 116/70 | HR 83 | Ht 59.5 in | Wt 108.4 lb

## 2020-05-10 DIAGNOSIS — I471 Supraventricular tachycardia, unspecified: Secondary | ICD-10-CM

## 2020-05-10 DIAGNOSIS — I059 Rheumatic mitral valve disease, unspecified: Secondary | ICD-10-CM | POA: Diagnosis not present

## 2020-05-10 DIAGNOSIS — I493 Ventricular premature depolarization: Secondary | ICD-10-CM | POA: Diagnosis not present

## 2020-05-10 NOTE — Progress Notes (Signed)
Patient Care Team: Tracie Harrier, MD as PCP - General (Internal Medicine)   HPI  Nicole Norman is a 74 y.o. female Seen in followup for VT and PVCs in the context of prior Mitral valve repair ( 07/02/2016 Complex valvuloplasty including quadrangular resection of posterior leaflet, folding plasty, Gore-tex neochord placement x6 and 32 mm Sorin Memo 3D ring annuloplasty with clipping of LA appendage  )  for severe MR.   She was started on flecainide and with that she noted a significant reduction in her palpitations and fatigue.   Unfortunately, QRS with the PR interval lengthened considerably on flecainide 100 twice daily and down titration to 50 was necessary.  We then returned to 75 mg twice daily with symptomatic improvement  However, her improvement still leaves her with biweekly episodes of palpitations associated with presyncope.    DATE TEST EF   11/17 Cath  normal % 4+ MR/LAE Severe  12/17 Echo   55-60 %   3/19 Echo  55-65%   3/21 Echo  55-60%    Date PVCs  10/19 6% (Holter)  3/21 10.5% (ZIO)            DATE PR interval QRSduration Flecainide  7/18  182 80 0  9/18 208 114 100  3/19 246 110 100  5/19 180 104 50  1/21 162 82 50  4/21 198 94 75      Past Medical History:  Diagnosis Date   CHF (congestive heart failure) ([place])    a. In setting of severe MR; b. 06/2016 TEE: EF 65-60%.   COPD (chronic obstructive pulmonary disease) ([facility])    pt. denies   History of bronchitis    History of pneumonia    Incidental pulmonary nodule 06/13/2016   Multiple small ground glass opacities seen on chest CT scan   Migraine    1x/month - uses Aleve   Normal coronary arteries    a. cardiac cath 06/19/2016: normal coronary arteries and left dominant system, LVEDP nl, 4+ mitral regurgitation, RHC showed mildly elevated LVEDP (4mmHg), giant V waves noted on PCWP w/ mild pulmonary HTN., pulmonary pressure 39/10 with a mean of 27 mmHg, mildly reduced  cardiac output at 3.1 with a cardiac index of 2.09, recommned mitral valve repair, acceptable volume status   Paroxysmal SVT (supraventricular tachycardia) ([facility])    PVC's (premature ventricular contractions)    a. 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 07/02/2016 Complex valvuloplasty including quadrangular resection of posterior leaflet, folding plasty, Gore-tex neochord placement x6 and 32 mm Sorin Memo 3D ring annuloplasty with clipping of LA appendage via right mini thoracotomy approach.   Severe mitral regurgitation    a. TTE 06/05/16: EF 60-65%, nl WM, LV dias fxn nl, mod prolapse of posterior mitral leaflet with mod eccentric regurg, LA mildly dilated, PASP nl; b. TEE 06/18/16: EF 65-70%, LV mildly dilated, no valvular vegetations seen throughout, mitral valve w/ flail motion involving the middle scallop of the post leaflet. Severe regurgitation directed eccentrically, LA mildly dilated   Tobacco abuse    Vertigo    no meds    Past Surgical History:  Procedure Laterality Date   CARDIAC CATHETERIZATION N/A 06/19/2016   Procedure: Right/Left Heart Cath and Coronary Angiography;  Surgeon: Wellington Hampshire, MD;  Location: [facility];  Service: Cardiovascular;  Laterality: N/A;   CESAREAN SECTION     CHOLECYSTECTOMY  CLIPPING OF ATRIAL APPENDAGE N/A 07/02/2016   Procedure: CLIPPING OF ATRIAL APPENDAGE;  Surgeon: Rexene Alberts, MD;  Location: [facility];  Service: Open Heart Surgery;  Laterality: N/A;   COLONOSCOPY N/A 12/07/2014   Procedure: COLONOSCOPY;  Surgeon: Lucilla Lame, MD;  Location: [facility];  Service: Gastroenterology;  Laterality: N/A;   ESOPHAGOGASTRODUODENOSCOPY N/A 12/07/2014   Procedure: ESOPHAGOGASTRODUODENOSCOPY (EGD);  Surgeon: Lucilla Lame, MD;  Location: [facility];  Service: Gastroenterology;  Laterality: N/A;   EYE SURGERY Bilateral    lasic   MITRAL VALVE REPAIR  Right 07/02/2016   Procedure: MINIMALLY INVASIVE MITRAL VALVE REPAIR (MVR);  Surgeon: Rexene Alberts, MD;  Location: [facility];  Service: Open Heart Surgery;  Laterality: Right;   TEE WITHOUT CARDIOVERSION N/A 06/18/2016   Procedure: TRANSESOPHAGEAL ECHOCARDIOGRAM (TEE);  Surgeon: Nelva Bush, MD;  Location: [facility];  Service: Cardiovascular;  Laterality: N/A;   TEE WITHOUT CARDIOVERSION N/A 07/02/2016   Procedure: TRANSESOPHAGEAL ECHOCARDIOGRAM (TEE);  Surgeon: Rexene Alberts, MD;  Location: [facility];  Service: Open Heart Surgery;  Laterality: N/A;   TONSILLECTOMY      Current Outpatient Medications  Medication Sig Dispense Refill   ascorbic acid (VITAMIN C) 500 MG tablet Take 500 mg by mouth daily.     aspirin EC 81 MG tablet Take 1 tablet (81 mg total) by mouth daily.     calcium-vitamin D (OSCAL WITH D) 500-200 MG-UNIT tablet Take 1 tablet by mouth.     cholecalciferol (VITAMIN D) 1000 units tablet Take 5,000 Units by mouth daily.     flecainide (TAMBOCOR) 50 MG tablet Take 1 tablet (50 mg total) by mouth 3 (three) times daily. 270 tablet 3   metoprolol succinate (TOPROL-XL) 25 MG 24 hr tablet TAKE 1 TABLET BY MOUTH ONCE DAILY (TAKE  WITH  OR  IMMEDIATELY  FOLLOWING  A  MEAL) 90 tablet 0   Multiple Vitamin (MULTIVITAMIN) tablet Take 1 tablet by mouth daily.     vitamin B-12 (CYANOCOBALAMIN) 1000 MCG tablet Take 1,000 mcg by mouth daily.     No current facility-administered medications for this visit.    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  I will have her visit with Dr. Quentin Ore to consider catheter ablation as an alternative to an alternative drug.  She is interested in this option.  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 Lengthy discussion regarding vaccine status and the potential beneficial role of natural immunity.  Reviewed with her the data from [facility] health related to vaccine status in ICU/ventilated patients and offered her testing for IgG SARS.  In the event that she were IgG negative have urged her to consider vaccination.

## 2020-05-10 NOTE — Patient Instructions (Addendum)
Medication Instructions:  - Your physician recommends that you continue on your current medications as directed. Please refer to the Current Medication list given to you today.  - You could take flecainide 50 mg- 1.5 tablets (75 mg) twice daily if this is easier for you (this is not technically changing your current dose)  *If you need a refill on your cardiac medications before your next appointment, please call your pharmacy*   Lab Work: - none ordered  If you have labs (blood work) drawn today and your tests are completely normal, you will receive your results only by:  [place] (if you have MyChart) OR  A paper copy in the mail If you have any lab test that is abnormal or we need to change your treatment, we will call you to review the results.   Testing/Procedures: - none ordered  Follow-Up: At [facility], you and your health needs are our priority.  As part of our continuing mission to provide you with exceptional heart care, we have created designated Provider Care Teams.  These Care Teams include your primary Cardiologist (physician) and Advanced Practice Providers (APPs -  Physician Assistants and Nurse Practitioners) who all work together to provide you with the care you need, when you need it.  We recommend signing up for the patient portal called "MyChart".  Sign up information is provided on this After Visit Summary.  MyChart is used to connect with patients for Virtual Visits (Telemedicine).  Patients are able to view lab/test results, encounter notes, upcoming appointments, etc.  Non-urgent messages can be sent to your provider as well.   To learn more about what you can do with MyChart, go to [email or web address].    Your next appointment:    1) You have been referred to: Dr. Lars Mage- to discuss possible ablation  2) Dr. Caryl Comes-  Pending your consultation with Dr. Quentin Ore   The format for your next appointment:   In Person  Provider:   As  above   Other Instructions n/a

## 2020-05-30 ENCOUNTER — Other Ambulatory Visit: Payer: Self-pay

## 2020-05-30 ENCOUNTER — Encounter: Payer: Self-pay | Admitting: Cardiology

## 2020-05-30 ENCOUNTER — Ambulatory Visit: Payer: PPO | Admitting: Cardiology

## 2020-05-30 VITALS — BP 118/70 | HR 88 | Ht 59.5 in | Wt 108.0 lb

## 2020-05-30 DIAGNOSIS — I493 Ventricular premature depolarization: Secondary | ICD-10-CM | POA: Diagnosis not present

## 2020-05-30 NOTE — Patient Instructions (Addendum)
Medication Instructions:  Your physician has recommended you make the following change in your medication:   1.  STOP flecainide  *If you need a refill on your cardiac medications before your next appointment, please call your pharmacy*  Lab Work: None ordered. If you have labs (blood work) drawn today and your tests are completely normal, you will receive your results only by: Marland Kitchen MyChart Message (if you have MyChart) OR . A paper copy in the mail If you have any lab test that is abnormal or we need to change your treatment, we will call you to review the results.  Testing/Procedures: None ordered.  Follow-Up: At [facility], you and your health needs are our priority.  As part of our continuing mission to provide you with exceptional heart care, we have created designated Provider Care Teams.  These Care Teams include your primary Cardiologist (physician) and Advanced Practice Providers (APPs -  Physician Assistants and Nurse Practitioners) who all work together to provide you with the care you need, when you need it.  Your next appointment:   Your physician wants you to follow-up in: one month with Dr. Quentin Ore.     June 27, 2020 at 9:15 am at the [facility] office

## 2020-05-30 NOTE — Progress Notes (Signed)
Electrophysiology Office Note:    Date:  05/30/2020   ID:  Nicole Norman, DOB 04/15/1946, MRN [id number]  PCP:  Tracie Harrier, MD  [facility] Cardiologist:  No primary care provider on file.  [facility] Electrophysiologist:  None   Referring MD: Tracie Harrier, MD   Chief Complaint: Palpitations  History of Present Illness:    Nicole Norman is a 74 y.o. female who presents for an evaluation of symptomatic PVCs at the request of Dr. Jolyn Nap. Their medical history includes mitral valve repair in 2017, tobacco abuse, COPD.  She is a longtime patient of Dr. Caryl Comes and has been followed for PVCs.  PVCs appear to be originating from the left ventricle, likely in the subvalvular apparatus given her history of mitral regurgitation now post mitral valve repair.  She is on flecainide now at 75 mg twice daily.  This is improved to the burden of PVCs but she is experiencing fogginess, fatigue, decreased exercise tolerance.  These are constant symptoms for her and have no relationship to her PVCs.  She has been wondering to herself whether or not the symptoms she experiences now are worse than her prior PVCs.  She also tells me she is experiencing trouble maintaining her weight.  She has no appetite.  Past Medical History:  Diagnosis Date  . CHF (congestive heart failure) ([place])    a. In setting of severe MR; b. 06/2016 TEE: EF 65-60%.  Marland Kitchen COPD (chronic obstructive pulmonary disease) ([place])    pt. denies  . History of bronchitis   . History of pneumonia   . Incidental pulmonary nodule 06/13/2016   Multiple small ground glass opacities seen on chest CT scan  . Migraine    1x/month - uses Aleve  . Normal coronary arteries    a. cardiac cath 06/19/2016: normal coronary arteries and left dominant system, LVEDP nl, 4+ mitral regurgitation, RHC showed mildly elevated LVEDP (68mmHg), giant V waves noted on PCWP w/ mild pulmonary HTN., pulmonary pressure 39/10 with a mean of 27  mmHg, mildly reduced cardiac output at 3.1 with a cardiac index of 2.09, recommned mitral valve repair, acceptable volume status  . Paroxysmal SVT (supraventricular tachycardia) ([place])   . PVC's (premature ventricular contractions)    a. Holter 2015 with 26,000 PVCs in 48 hours  . 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 07/02/2016 Complex valvuloplasty including quadrangular resection of posterior leaflet, folding plasty, Gore-tex neochord placement x6 and 32 mm Sorin Memo 3D ring annuloplasty with clipping of LA appendage via right mini thoracotomy approach.  . Severe mitral regurgitation    a. TTE 06/05/16: EF 60-65%, nl WM, LV dias fxn nl, mod prolapse of posterior mitral leaflet with mod eccentric regurg, LA mildly dilated, PASP nl; b. TEE 06/18/16: EF 65-70%, LV mildly dilated, no valvular vegetations seen throughout, mitral valve w/ flail motion involving the middle scallop of the post leaflet. Severe regurgitation directed eccentrically, LA mildly dilated  . Tobacco abuse   . Vertigo    no meds    Past Surgical History:  Procedure Laterality Date  . CARDIAC CATHETERIZATION N/A 06/19/2016   Procedure: Right/Left Heart Cath and Coronary Angiography;  Surgeon: Wellington Hampshire, MD;  Location: [place] [facility];  Service: Cardiovascular;  Laterality: N/A;  . CESAREAN SECTION    . CHOLECYSTECTOMY    . CLIPPING OF ATRIAL APPENDAGE N/A 07/02/2016   Procedure: CLIPPING OF ATRIAL APPENDAGE;  Surgeon: Rexene Alberts, MD;  Location: [place];  Service: Open Heart Surgery;  Laterality: N/A;  . COLONOSCOPY N/A 12/07/2014   Procedure: COLONOSCOPY;  Surgeon: Lucilla Lame, MD;  Location: [place];  Service: Gastroenterology;  Laterality: N/A;  . ESOPHAGOGASTRODUODENOSCOPY N/A 12/07/2014   Procedure: ESOPHAGOGASTRODUODENOSCOPY (EGD);  Surgeon: Lucilla Lame, MD;  Location: [place];  Service: Gastroenterology;  Laterality: N/A;  . EYE SURGERY Bilateral    lasic  .  MITRAL VALVE REPAIR Right 07/02/2016   Procedure: MINIMALLY INVASIVE MITRAL VALVE REPAIR (MVR);  Surgeon: Rexene Alberts, MD;  Location: [place];  Service: Open Heart Surgery;  Laterality: Right;  . TEE WITHOUT CARDIOVERSION N/A 06/18/2016   Procedure: TRANSESOPHAGEAL ECHOCARDIOGRAM (TEE);  Surgeon: Nelva Bush, MD;  Location: [facility];  Service: Cardiovascular;  Laterality: N/A;  . TEE WITHOUT CARDIOVERSION N/A 07/02/2016   Procedure: TRANSESOPHAGEAL ECHOCARDIOGRAM (TEE);  Surgeon: Rexene Alberts, MD;  Location: [place];  Service: Open Heart Surgery;  Laterality: N/A;  . TONSILLECTOMY      Current Medications: Current Meds  Medication Sig  . ascorbic acid (VITAMIN C) 500 MG tablet Take 500 mg by mouth daily.  Marland Kitchen aspirin EC 81 MG tablet Take 1 tablet (81 mg total) by mouth daily.  . calcium-vitamin D (OSCAL WITH D) 500-200 MG-UNIT tablet Take 1 tablet by mouth.  . cholecalciferol (VITAMIN D) 1000 units tablet Take 5,000 Units by mouth daily.  . flecainide (TAMBOCOR) 50 MG tablet Take 1 tablet (50 mg total) by mouth 3 (three) times daily.  . metoprolol succinate (TOPROL-XL) 25 MG 24 hr tablet TAKE 1 TABLET BY MOUTH ONCE DAILY (TAKE  WITH  OR  IMMEDIATELY  FOLLOWING  A  MEAL)  . Multiple Vitamin (MULTIVITAMIN) tablet Take 1 tablet by mouth daily.  . vitamin B-12 (CYANOCOBALAMIN) 1000 MCG tablet Take 1,000 mcg by mouth daily.  . vitamin B-12 (CYANOCOBALAMIN) 50 MCG tablet Take 50 mcg by mouth daily.  . Zinc Citrate 16.67 MG CHEW Chew by mouth daily.     Allergies:   Patient has no known allergies.   Social History   Socioeconomic History  . Marital status: Married    Spouse name: Not on file  . Number of children: Not on file  . Years of education: Not on file  . Highest education level: Not on file  Occupational History  . Not on file  Tobacco Use  . Smoking status: Former Smoker    Packs/day: 0.50    Years: 45.00    Pack years: 22.50    Types: Cigarettes    Quit date:  07/02/2016    Years since quitting: 3.9  . Smokeless tobacco: Never Used  . Tobacco comment: quit date 06/13/16  Vaping Use  . Vaping Use: Never used  Substance and Sexual Activity  . Alcohol use: Yes    Alcohol/week: 0.0 standard drinks    Comment: rarely  . Drug use: No  . Sexual activity: Not on file  Other Topics Concern  . Not on file  Social History Narrative  . Not on file   Social Determinants of Health   Financial Resource Strain:   . Difficulty of Paying Living Expenses: Not on file  Food Insecurity:   . Worried About Charity fundraiser in the Last Year: Not on file  . Ran Out of Food in the Last Year: Not on file  Transportation Needs:   . Lack of Transportation (Medical): Not on file  . Lack of Transportation (Non-Medical): Not on file  Physical Activity:   .  Days of Exercise per Week: Not on file  . Minutes of Exercise per Session: Not on file  Stress:   . Feeling of Stress : Not on file  Social Connections:   . Frequency of Communication with Friends and Family: Not on file  . Frequency of Social Gatherings with Friends and Family: Not on file  . Attends Religious Services: Not on file  . Active Member of Clubs or Organizations: Not on file  . Attends Archivist Meetings: Not on file  . Marital Status: Not on file     Family History: The patient's family history includes Alzheimer's disease in her mother; Breast cancer in her sister; Breast cancer (age of onset: 53) in her sister; Cancer in her brother, brother, sister, and sister; Stroke in her father.  ROS:   Please see the history of present illness.    All other systems reviewed and are negative.  EKGs/Labs/Other Studies Reviewed:    The following studies were reviewed today: Prior ZIO reports, echocardiogram, prior ECGs and records  October 12, 2019 echo personally reviewed Left ventricular function normal, 55 to 60% Right ventricular function normal Mitral valve repair with  annuloplasty ring  EKG:  The ekg ordered today demonstrates sinus rhythm.  No PVCs.  Recent Labs: No results found for requested labs within last 8760 hours.  Recent Lipid Panel    Component Value Date/Time   CHOL 138 06/16/2016 0410   TRIG 112 06/16/2016 0410   HDL 50 06/16/2016 0410   CHOLHDL 2.8 06/16/2016 0410   VLDL 22 06/16/2016 0410   LDLCALC 66 06/16/2016 0410    Physical Exam:    VS:  BP 118/70   Pulse 88   Ht 4' 11.5" (1.511 m)   Wt 108 lb (49 kg)   SpO2 98%   BMI 21.45 kg/m     Wt Readings from Last 3 Encounters:  05/30/20 108 lb (49 kg)  05/10/20 108 lb 6.4 oz (49.2 kg)  11/03/19 112 lb (50.8 kg)     GEN:  well developed in no acute distress.  Thin HEENT: Normal NECK: No JVD; No carotid bruits LYMPHATICS: No lymphadenopathy CARDIAC: RRR, no murmurs, rubs, gallops RESPIRATORY:  Clear to auscultation without rales, wheezing or rhonchi  ABDOMEN: Soft, non-tender, non-distended MUSCULOSKELETAL:  No edema; No deformity  SKIN: Warm and dry NEUROLOGIC:  Alert and oriented x 3 PSYCHIATRIC:  Normal affect   ASSESSMENT:    1. PVC (premature ventricular contraction)    PLAN:    In order of problems listed above:  1. Patient has a long history of PVCs related to her mitral valve disease.  She has been maintained on flecainide with various dose adjustments.  She is currently taking 75 mg twice daily.  She has constant fatigue, foggy thinking, gait instability that sound like they are related to flecainide.  She believes her current set of symptoms are more severe than the original symptoms with the PVCs.  I would recommend that we stop the flecainide for now.  We will monitor for improvement in these symptoms.  I will see her back in 4 weeks to reassess. If her generalized fatigue improves after stopping the flecainide, I would tolerate a slight increase in the burden of her PVCs as the LV function appears to be normal and she has had no sustained ventricular  arrhythmias.  I discussed other options with the patient today including changing antiarrhythmics to amiodarone versus ablation.  Ablation is a difficult choice for Ms.  Acy given the type of mitral valve repair performed in 2017.  Dr. Ihor Gully used 6 Gore-Tex neocords to help repair the mitral valve.  Ablation of her PVC would require extensive catheter manipulation in the subvalvular apparatus, likely within the papillary muscle structures themselves.  There is risk of damaging the neocords and mitral valve subvalvular apparatus during an ablation attempt while would like to avoid this if at all possible and use antiarrhythmic therapy first.    Medication Adjustments/Labs and Tests Ordered: Current medicines are reviewed at length with the patient today.  Concerns regarding medicines are outlined above.  Orders Placed This Encounter  Procedures  . EKG 12-Lead   No orders of the defined types were placed in this encounter.    Signed, Lars Mage, MD, [facility]  05/30/2020 11:24 AM    Electrophysiology [place]

## 2020-06-04 ENCOUNTER — Other Ambulatory Visit: Payer: Self-pay | Admitting: Cardiovascular Disease

## 2020-06-14 ENCOUNTER — Encounter: Payer: Self-pay | Admitting: Cardiovascular Disease

## 2020-06-14 ENCOUNTER — Other Ambulatory Visit: Payer: Self-pay

## 2020-06-14 ENCOUNTER — Ambulatory Visit: Payer: PPO | Admitting: Cardiovascular Disease

## 2020-06-14 VITALS — BP 110/70 | HR 91 | Ht 59.5 in | Wt 116.1 lb

## 2020-06-14 DIAGNOSIS — I493 Ventricular premature depolarization: Secondary | ICD-10-CM | POA: Diagnosis not present

## 2020-06-14 DIAGNOSIS — Z9889 Other specified postprocedural states: Secondary | ICD-10-CM | POA: Diagnosis not present

## 2020-06-14 NOTE — Patient Instructions (Addendum)
Medication Instructions:  Your physician recommends that you continue on your current medications as directed. Please refer to the Current Medication list given to you today.  *If you need a refill on your cardiac medications before your next appointment, please call your pharmacy*   Lab Work: None ordered If you have labs (blood work) drawn today and your tests are completely normal, you will receive your results only by: Marland Kitchen MyChart Message (if you have MyChart) OR . A paper copy in the mail If you have any lab test that is abnormal or we need to change your treatment, we will call you to review the results.   Testing/Procedures: None ordered   Follow-Up: At [facility], you and your health needs are our priority.  As part of our continuing mission to provide you with exceptional heart care, we have created designated Provider Care Teams.  These Care Teams include your primary Cardiologist (physician) and Advanced Practice Providers (APPs -  Physician Assistants and Nurse Practitioners) who all work together to provide you with the care you need, when you need it.  We recommend signing up for the patient portal called "MyChart".  Sign up information is provided on this After Visit Summary.  MyChart is used to connect with patients for Virtual Visits (Telemedicine).  Patients are able to view lab/test results, encounter notes, upcoming appointments, etc.  Non-urgent messages can be sent to your provider as well.   To learn more about what you can do with MyChart, go to [email or web address].    Your next appointment:   12 month(s)  The format for your next appointment:   In Person  Provider:   You may see Dr. Fletcher Anon or one of the following Advanced Practice Providers on your designated Care Team:    Murray Hodgkins, NP  Christell Faith, PA-C  Marrianne Mood, PA-C  Cadence Kathlen Mody, [place]    Other Instructions N/A

## 2020-06-14 NOTE — Progress Notes (Signed)
Cardiology Office Note   Date:  06/14/2020   ID:  Nicole Norman, DOB [birth year], MRN [id number]  PCP:  Tracie Harrier, MD  Cardiologist:   Kathlyn Sacramento, MD   Chief Complaint  Patient presents with  . OTHER    6 month f/u no complaints today. Meds reviewed verbally with pt.      History of Present Illness: Nicole Norman is a 74 y.o. female who presents for a follow-up visit regarding mitral valve prolapse with regurgitation status post minimally invasive mitral valve repair in November 2017. Cardiac catheterization before mitral valve surgery showed no significant coronary artery disease. She has no history of diabetes, hypertension or hyperlipidemia.  She quit smoking in 2017. She is known to have frequent PVCs with previous 48-hour Holter monitor showing a total of 53,000 beats representing 23% burden with short runs of nonsustained ventricular tachycardia. She was treated by Dr. Caryl Comes with flecainide but most recently, she was not tolerating the medication very well and thus she was referred to Dr. Quentin Ore to discontinued flecainide.  It was felt that ablation would be difficult given previous mitral valve repair.  If PVCs continue, the plan is to probably attempt a different antiarrhythmic medication.  Shortly after she stopped flecainide, she had worsening palpitations but then the symptoms improve gradually.  Surprisingly, she is not noted to have any PVCs today by EKG or physical exam. Most recent echo in March of this year showed normal LV systolic function and normal functioning mitral valve repair with mean gradient of 2 mmHg.  Past Medical History:  Diagnosis Date  . CHF (congestive heart failure) ([place])    a. In setting of severe MR; b. 06/2016 TEE: EF 65-60%.  Marland Kitchen COPD (chronic obstructive pulmonary disease) ([place])    pt. denies  . History of bronchitis   . History of pneumonia   . Incidental pulmonary nodule 06/13/2016   Multiple small ground glass  opacities seen on chest CT scan  . Migraine    1x/month - uses Aleve  . Normal coronary arteries    a. cardiac cath 06/19/2016: normal coronary arteries and left dominant system, LVEDP nl, 4+ mitral regurgitation, RHC showed mildly elevated LVEDP (12mmHg), giant V waves noted on PCWP w/ mild pulmonary HTN., pulmonary pressure 39/10 with a mean of 27 mmHg, mildly reduced cardiac output at 3.1 with a cardiac index of 2.09, recommned mitral valve repair, acceptable volume status  . Paroxysmal SVT (supraventricular tachycardia) ([place])   . PVC's (premature ventricular contractions)    a. Holter 2015 with 26,000 PVCs in 48 hours  . 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 07/02/2016 Complex valvuloplasty including quadrangular resection of posterior leaflet, folding plasty, Gore-tex neochord placement x6 and 32 mm Sorin Memo 3D ring annuloplasty with clipping of LA appendage via right mini thoracotomy approach.  . Severe mitral regurgitation    a. TTE 06/05/16: EF 60-65%, nl WM, LV dias fxn nl, mod prolapse of posterior mitral leaflet with mod eccentric regurg, LA mildly dilated, PASP nl; b. TEE 06/18/16: EF 65-70%, LV mildly dilated, no valvular vegetations seen throughout, mitral valve w/ flail motion involving the middle scallop of the post leaflet. Severe regurgitation directed eccentrically, LA mildly dilated  . Tobacco abuse   . Vertigo    no meds    Past Surgical History:  Procedure Laterality Date  . CARDIAC CATHETERIZATION N/A 06/19/2016   Procedure: Right/Left Heart Cath and Coronary Angiography;  Surgeon: Wellington Hampshire, MD;  Location: [place] [facility];  Service: Cardiovascular;  Laterality: N/A;  . CESAREAN SECTION    . CHOLECYSTECTOMY    . CLIPPING OF ATRIAL APPENDAGE N/A 07/02/2016   Procedure: CLIPPING OF ATRIAL APPENDAGE;  Surgeon: Rexene Alberts, MD;  Location: [place];  Service: Open Heart Surgery;  Laterality: N/A;  . COLONOSCOPY N/A 12/07/2014   Procedure:  COLONOSCOPY;  Surgeon: Lucilla Lame, MD;  Location: [place];  Service: Gastroenterology;  Laterality: N/A;  . ESOPHAGOGASTRODUODENOSCOPY N/A 12/07/2014   Procedure: ESOPHAGOGASTRODUODENOSCOPY (EGD);  Surgeon: Lucilla Lame, MD;  Location: [place];  Service: Gastroenterology;  Laterality: N/A;  . EYE SURGERY Bilateral    lasic  . MITRAL VALVE REPAIR Right 07/02/2016   Procedure: MINIMALLY INVASIVE MITRAL VALVE REPAIR (MVR);  Surgeon: Rexene Alberts, MD;  Location: [place];  Service: Open Heart Surgery;  Laterality: Right;  . TEE WITHOUT CARDIOVERSION N/A 06/18/2016   Procedure: TRANSESOPHAGEAL ECHOCARDIOGRAM (TEE);  Surgeon: Nelva Bush, MD;  Location: [facility];  Service: Cardiovascular;  Laterality: N/A;  . TEE WITHOUT CARDIOVERSION N/A 07/02/2016   Procedure: TRANSESOPHAGEAL ECHOCARDIOGRAM (TEE);  Surgeon: Rexene Alberts, MD;  Location: [place];  Service: Open Heart Surgery;  Laterality: N/A;  . TONSILLECTOMY       Current Outpatient Medications  Medication Sig Dispense Refill  . ascorbic acid (VITAMIN C) 500 MG tablet Take 500 mg by mouth daily.    Marland Kitchen aspirin EC 81 MG tablet Take 1 tablet (81 mg total) by mouth daily.    . calcium-vitamin D (OSCAL WITH D) 500-200 MG-UNIT tablet Take 1 tablet by mouth.    . cholecalciferol (VITAMIN D) 1000 units tablet Take 5,000 Units by mouth daily.    . metoprolol succinate (TOPROL-XL) 25 MG 24 hr tablet TAKE 1 TABLET BY MOUTH ONCE DAILY (TAKE  WITH  OR  IMMEDIATELY  FOLLOWING  A  MEAL) 90 tablet 0  . Multiple Vitamin (MULTIVITAMIN) tablet Take 1 tablet by mouth daily.    . vitamin B-12 (CYANOCOBALAMIN) 1000 MCG tablet Take 1,000 mcg by mouth daily.    . Zinc Citrate 16.67 MG CHEW Chew by mouth daily.     No current facility-administered medications for this visit.    Allergies:   Patient has no known allergies.    Social History:  The patient  reports that she quit smoking about 3 years ago. Her smoking use included  cigarettes. She has a 22.50 pack-year smoking history. She has never used smokeless tobacco. She reports current alcohol use. She reports that she does not use drugs.   Family History:  The patient's family history includes Alzheimer's disease in her mother; Breast cancer in her sister; Breast cancer (age of onset: 94) in her sister; Cancer in her brother, brother, sister, and sister; Stroke in her father.    ROS:  Please see the history of present illness.   Otherwise, review of systems are positive for none.   All other systems are reviewed and negative.    PHYSICAL EXAM: VS:  BP 110/70 (BP Location: Left Arm, Patient Position: Sitting, Cuff Size: Normal)   Pulse 91   Ht 4' 11.5" (1.511 m)   Wt 116 lb 2 oz (52.7 kg)   SpO2 97%   BMI 23.06 kg/m  , BMI Body mass index is 23.06 kg/m. GEN: Well nourished, well developed, in no acute distress  HEENT: normal  Neck: no JVD, carotid bruits, or masses Cardiac: RRR ; no rubs, murmurs or gallops,no edema . Respiratory:  clear to auscultation bilaterally, normal work of breathing GI: soft, nontender, nondistended, + BS MS: no deformity or atrophy  Skin: warm and dry, no rash Neuro:  Strength and sensation are intact Psych: euthymic mood, full affect   EKG:  EKG is ordered today. EKG showed sinus rhythm with no evidence of PVCs   Recent Labs: No results found for requested labs within last 8760 hours.    Lipid Panel    Component Value Date/Time   CHOL 138 06/16/2016 0410   TRIG 112 06/16/2016 0410   HDL 50 06/16/2016 0410   CHOLHDL 2.8 06/16/2016 0410   VLDL 22 06/16/2016 0410   LDLCALC 66 06/16/2016 0410      Wt Readings from Last 3 Encounters:  06/14/20 116 lb 2 oz (52.7 kg)  05/30/20 108 lb (49 kg)  05/10/20 108 lb 6.4 oz (49.2 kg)       No flowsheet data found.    ASSESSMENT AND PLAN:  1.  Mitral valve disease with mitral valve prolapse: Status post mitral valve repair in November 2017.  Most recent echo in  March of 2021 showed normal functioning mitral valve with minimal gradient.   2. Frequent PVCs: Currently followed by Dr. Quentin Ore.  Flecainide was recently discontinued and she seems to be feeling better without flecainide.   Disposition: Continue to follow-up with the EP for PVCs.  Follow-up with me on an annual basis.  Signed,  Kathlyn Sacramento, MD  06/14/2020 8:32 AM    [facility]

## 2020-06-27 ENCOUNTER — Other Ambulatory Visit: Payer: Self-pay

## 2020-06-27 ENCOUNTER — Ambulatory Visit: Payer: PPO | Admitting: Cardiology

## 2020-06-27 ENCOUNTER — Encounter: Payer: Self-pay | Admitting: Cardiology

## 2020-06-27 VITALS — BP 122/82 | HR 83 | Ht 59.5 in | Wt 106.0 lb

## 2020-06-27 DIAGNOSIS — Z9889 Other specified postprocedural states: Secondary | ICD-10-CM | POA: Diagnosis not present

## 2020-06-27 DIAGNOSIS — J449 Chronic obstructive pulmonary disease, unspecified: Secondary | ICD-10-CM | POA: Diagnosis not present

## 2020-06-27 DIAGNOSIS — I493 Ventricular premature depolarization: Secondary | ICD-10-CM | POA: Diagnosis not present

## 2020-06-27 NOTE — Progress Notes (Signed)
Electrophysiology Office Follow up Visit Note:    Date:  06/27/2020   ID:  Nicole Norman, DOB 28-Aug-1945, MRN [id number]  PCP:  Tracie Harrier, MD  [facility] Cardiologist:  No primary care provider on file.  [facility] Electrophysiologist:  None    Interval History:    Nicole Norman is a 74 y.o. female who presents for a follow up visit. They were last seen in clinic May 30, 2020 for symptomatic PVCs. At the time I last saw her, she was taking flecainide but she was having a significant amount of off target effects on the flecainide. She thought she felt worse while taking flecainide and so the decision was made to stop this medication.  She tells me that since she stopped the flecainide she has been doing very well.  For several days after she stop the flecainide she felt worse but then the symptoms gradually improved and now she rarely feels any PVCs.  She continues on the Toprol-XL 25 mg once a day and is tolerating this well.  Past Medical History:  Diagnosis Date  . CHF (congestive heart failure) ([place])    a. In setting of severe MR; b. 06/2016 TEE: EF 65-60%.  Marland Kitchen COPD (chronic obstructive pulmonary disease) ([place])    pt. denies  . History of bronchitis   . History of pneumonia   . Incidental pulmonary nodule 06/13/2016   Multiple small ground glass opacities seen on chest CT scan  . Migraine    1x/month - uses Aleve  . Normal coronary arteries    a. cardiac cath 06/19/2016: normal coronary arteries and left dominant system, LVEDP nl, 4+ mitral regurgitation, RHC showed mildly elevated LVEDP (50mmHg), giant V waves noted on PCWP w/ mild pulmonary HTN., pulmonary pressure 39/10 with a mean of 27 mmHg, mildly reduced cardiac output at 3.1 with a cardiac index of 2.09, recommned mitral valve repair, acceptable volume status  . Paroxysmal SVT (supraventricular tachycardia) ([place])   . PVC's (premature ventricular contractions)    a. Holter 2015 with 26,000  PVCs in 48 hours  . 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 07/02/2016 Complex valvuloplasty including quadrangular resection of posterior leaflet, folding plasty, Gore-tex neochord placement x6 and 32 mm Sorin Memo 3D ring annuloplasty with clipping of LA appendage via right mini thoracotomy approach.  . Severe mitral regurgitation    a. TTE 06/05/16: EF 60-65%, nl WM, LV dias fxn nl, mod prolapse of posterior mitral leaflet with mod eccentric regurg, LA mildly dilated, PASP nl; b. TEE 06/18/16: EF 65-70%, LV mildly dilated, no valvular vegetations seen throughout, mitral valve w/ flail motion involving the middle scallop of the post leaflet. Severe regurgitation directed eccentrically, LA mildly dilated  . Tobacco abuse   . Vertigo    no meds    Past Surgical History:  Procedure Laterality Date  . CARDIAC CATHETERIZATION N/A 06/19/2016   Procedure: Right/Left Heart Cath and Coronary Angiography;  Surgeon: Wellington Hampshire, MD;  Location: [place] [facility];  Service: Cardiovascular;  Laterality: N/A;  . CESAREAN SECTION    . CHOLECYSTECTOMY    . CLIPPING OF ATRIAL APPENDAGE N/A 07/02/2016   Procedure: CLIPPING OF ATRIAL APPENDAGE;  Surgeon: Rexene Alberts, MD;  Location: [place];  Service: Open Heart Surgery;  Laterality: N/A;  . COLONOSCOPY N/A 12/07/2014   Procedure: COLONOSCOPY;  Surgeon: Lucilla Lame, MD;  Location: [place];  Service: Gastroenterology;  Laterality: N/A;  . ESOPHAGOGASTRODUODENOSCOPY N/A 12/07/2014  Procedure: ESOPHAGOGASTRODUODENOSCOPY (EGD);  Surgeon: Lucilla Lame, MD;  Location: [place];  Service: Gastroenterology;  Laterality: N/A;  . EYE SURGERY Bilateral    lasic  . MITRAL VALVE REPAIR Right 07/02/2016   Procedure: MINIMALLY INVASIVE MITRAL VALVE REPAIR (MVR);  Surgeon: Rexene Alberts, MD;  Location: [place];  Service: Open Heart Surgery;  Laterality: Right;  . TEE WITHOUT CARDIOVERSION N/A 06/18/2016   Procedure:  TRANSESOPHAGEAL ECHOCARDIOGRAM (TEE);  Surgeon: Nelva Bush, MD;  Location: [facility];  Service: Cardiovascular;  Laterality: N/A;  . TEE WITHOUT CARDIOVERSION N/A 07/02/2016   Procedure: TRANSESOPHAGEAL ECHOCARDIOGRAM (TEE);  Surgeon: Rexene Alberts, MD;  Location: [place];  Service: Open Heart Surgery;  Laterality: N/A;  . TONSILLECTOMY      Current Medications: Current Meds  Medication Sig  . amoxicillin (AMOXIL) 500 MG capsule Take by mouth as needed. Dental procedure  . ascorbic acid (VITAMIN C) 500 MG tablet Take 500 mg by mouth daily.  Marland Kitchen aspirin EC 81 MG tablet Take 1 tablet (81 mg total) by mouth daily.  . calcium-vitamin D (OSCAL WITH D) 500-200 MG-UNIT tablet Take 1 tablet by mouth.  . cholecalciferol (VITAMIN D) 1000 units tablet Take 5,000 Units by mouth daily.  . metoprolol succinate (TOPROL-XL) 25 MG 24 hr tablet TAKE 1 TABLET BY MOUTH ONCE DAILY (TAKE  WITH  OR  IMMEDIATELY  FOLLOWING  A  MEAL)  . Multiple Vitamin (MULTIVITAMIN) tablet Take 1 tablet by mouth daily.  . vitamin B-12 (CYANOCOBALAMIN) 1000 MCG tablet Take 1,000 mcg by mouth daily.  . Zinc Citrate 16.67 MG CHEW Chew by mouth daily.     Allergies:   Patient has no known allergies.   Social History   Socioeconomic History  . Marital status: Married    Spouse name: Not on file  . Number of children: Not on file  . Years of education: Not on file  . Highest education level: Not on file  Occupational History  . Not on file  Tobacco Use  . Smoking status: Former Smoker    Packs/day: 0.50    Years: 45.00    Pack years: 22.50    Types: Cigarettes    Quit date: 07/02/2016    Years since quitting: 3.9  . Smokeless tobacco: Never Used  . Tobacco comment: quit date 06/13/16  Vaping Use  . Vaping Use: Never used  Substance and Sexual Activity  . Alcohol use: Yes    Alcohol/week: 0.0 standard drinks    Comment: rarely  . Drug use: No  . Sexual activity: Not on file  Other Topics Concern  . Not on file   Social History Narrative  . Not on file   Social Determinants of Health   Financial Resource Strain:   . Difficulty of Paying Living Expenses: Not on file  Food Insecurity:   . Worried About Charity fundraiser in the Last Year: Not on file  . Ran Out of Food in the Last Year: Not on file  Transportation Needs:   . Lack of Transportation (Medical): Not on file  . Lack of Transportation (Non-Medical): Not on file  Physical Activity:   . Days of Exercise per Week: Not on file  . Minutes of Exercise per Session: Not on file  Stress:   . Feeling of Stress : Not on file  Social Connections:   . Frequency of Communication with Friends and Family: Not on file  . Frequency of Social Gatherings with Friends and Family: Not  on file  . Attends Religious Services: Not on file  . Active Member of Clubs or Organizations: Not on file  . Attends Archivist Meetings: Not on file  . Marital Status: Not on file     Family History: The patient's family history includes Alzheimer's disease in her mother; Breast cancer in her sister; Breast cancer (age of onset: 68) in her sister; Cancer in her brother, brother, sister, and sister; Stroke in her father.  ROS:   Please see the history of present illness.    All other systems reviewed and are negative.  EKGs/Labs/Other Studies Reviewed:    The following studies were reviewed today: Per notes, prior EKGs  June 14, 2020 EKG personally reviewed shows normal sinus rhythm and no PVCs  EKG:  The ekg ordered today demonstrates sinus rhythm with frequent PVCs.  PVCs are monomorphic.  Recent Labs: No results found for requested labs within last 8760 hours.  Recent Lipid Panel    Component Value Date/Time   CHOL 138 06/16/2016 0410   TRIG 112 06/16/2016 0410   HDL 50 06/16/2016 0410   CHOLHDL 2.8 06/16/2016 0410   VLDL 22 06/16/2016 0410   LDLCALC 66 06/16/2016 0410    Physical Exam:    VS:  BP 122/82   Pulse 83   Ht 4'  11.5" (1.511 m)   Wt 106 lb (48.1 kg)   SpO2 99%   BMI 21.05 kg/m     Wt Readings from Last 3 Encounters:  06/27/20 106 lb (48.1 kg)  06/14/20 116 lb 2 oz (52.7 kg)  05/30/20 108 lb (49 kg)     GEN:  Well nourished, well developed in no acute distress HEENT: Normal NECK: No JVD; No carotid bruits LYMPHATICS: No lymphadenopathy CARDIAC: RRR with PVCs, no murmurs, rubs, gallops RESPIRATORY:  Clear to auscultation without rales, wheezing or rhonchi  ABDOMEN: Soft, non-tender, non-distended MUSCULOSKELETAL:  No edema; No deformity  SKIN: Warm and dry NEUROLOGIC:  Alert and oriented x 3 PSYCHIATRIC:  Normal affect   ASSESSMENT:    1. PVC (premature ventricular contraction)   2. S/P minimally invasive mitral valve repair   3. Chronic obstructive pulmonary disease, unspecified COPD type ([place])    PLAN:    In order of problems listed above:  1. PVCs Patient continues to have PVCs on ECG and exam today but she tells me she is no longer symptomatic now that she has stopped the flecainide.  I would recommend continuing the metoprolol succinate 25 mg once daily.  No indication to restart antiarrhythmic therapy.  I will plan on seeing her back in 6 to 12 months with an echo at that time to confirm preserved left ventricular function.  Given her history of not tolerating flecainide, if the patient develops recurrent symptomatic PVCs or develops left ventricular function thought to be secondary to PVCs, I would recommend starting amiodarone for PVC suppression.  Ablation is not an ideal first choice for her given her history of minimally invasive mitral valve surgery which included subvalvular apparatus repair with neocords.    Medication Adjustments/Labs and Tests Ordered: Current medicines are reviewed at length with the patient today.  Concerns regarding medicines are outlined above.  No orders of the defined types were placed in this encounter.  No orders of the defined types were  placed in this encounter.    Signed, Lars Mage, MD, [facility]  06/27/2020 9:39 AM    Electrophysiology  [facility]

## 2020-08-13 ENCOUNTER — Other Ambulatory Visit: Payer: Self-pay | Admitting: Cardiovascular Disease

## 2020-08-16 ENCOUNTER — Telehealth: Payer: Self-pay | Admitting: Cardiovascular Disease

## 2020-08-16 NOTE — Telephone Encounter (Signed)
*  STAT* If patient is at the pharmacy, call can be transferred to refill team.   1. Which medications need to be refilled? (please list name of each medication and dose if known) metoprolol 25 mg  2. Which pharmacy/location (including street and city if local pharmacy) is medication to be sent to? [facility] [address]   3. Do they need a 30 day or 90 day supply? 90  Patient is unsure if medication is able to pick up die to a history of issues with a prior auth. Please check with pharmacy and advise patient when completed

## 2020-08-16 NOTE — Telephone Encounter (Signed)
I spoke with pharmacy no PA required pt has to pay $10 and pharmacy will go head and run it through and refill for pt to pick up.

## 2020-08-16 NOTE — Telephone Encounter (Signed)
Pt aware no PA required and will pick up medication when ready.

## 2021-02-19 DIAGNOSIS — F17210 Nicotine dependence, cigarettes, uncomplicated: Secondary | ICD-10-CM | POA: Diagnosis not present

## 2021-02-19 DIAGNOSIS — L237 Allergic contact dermatitis due to plants, except food: Secondary | ICD-10-CM | POA: Diagnosis not present

## 2021-02-19 DIAGNOSIS — Z9889 Other specified postprocedural states: Secondary | ICD-10-CM | POA: Diagnosis not present

## 2021-02-19 DIAGNOSIS — Z720 Tobacco use: Secondary | ICD-10-CM | POA: Diagnosis not present

## 2021-04-03 ENCOUNTER — Other Ambulatory Visit: Payer: Self-pay | Admitting: Internal Medicine

## 2021-04-03 DIAGNOSIS — Z1231 Encounter for screening mammogram for malignant neoplasm of breast: Secondary | ICD-10-CM

## 2021-04-17 ENCOUNTER — Ambulatory Visit
Admission: RE | Admit: 2021-04-17 | Discharge: 2021-04-17 | Disposition: A | Discharge status: Home / Self Care | Payer: PPO | Source: Ambulatory Visit | Attending: Internal Medicine | Admitting: Internal Medicine

## 2021-04-17 ENCOUNTER — Other Ambulatory Visit: Payer: Self-pay

## 2021-04-17 DIAGNOSIS — Z1231 Encounter for screening mammogram for malignant neoplasm of breast: Secondary | ICD-10-CM | POA: Diagnosis not present

## 2021-05-20 ENCOUNTER — Other Ambulatory Visit: Payer: Self-pay | Admitting: Cardiovascular Disease

## 2021-06-25 NOTE — Progress Notes (Signed)
Electrophysiology Office Follow up Visit Note:    Date:  06/26/2021   ID:  Nicole Norman, DOB 1946-06-30, MRN [id number]  PCP:  Tracie Harrier, MD  [facility] Cardiologist:  None  [facility] Electrophysiologist:  Vickie Epley, MD    Interval History:    Nicole Norman is a 75 y.o. female who presents for a follow up visit.  I last saw the patient May 30, 2020 for palpitations and symptomatic PVCs.  She has a history of mitral valve repair.  The PVC morphology suggested an origin within the left ventricle likely related to the subvalvular apparatus.  Flecainide was stopped at that appointment given the concern that it was causing more symptoms than the PVCs originally did.  Today she tells me she is feeling better now she is off flecainide.  She continues to have palpitations consistent with her diagnosis of PVCs.  No syncope or presyncope.      Past Medical History:  Diagnosis Date   CHF (congestive heart failure) ([place])    a. In setting of severe MR; b. 06/2016 TEE: EF 65-60%.   COPD (chronic obstructive pulmonary disease) ([facility])    pt. denies   History of bronchitis    History of pneumonia    Incidental pulmonary nodule 06/13/2016   Multiple small ground glass opacities seen on chest CT scan   Migraine    1x/month - uses Aleve   Normal coronary arteries    a. cardiac cath 06/19/2016: normal coronary arteries and left dominant system, LVEDP nl, 4+ mitral regurgitation, RHC showed mildly elevated LVEDP (48mmHg), giant V waves noted on PCWP w/ mild pulmonary HTN., pulmonary pressure 39/10 with a mean of 27 mmHg, mildly reduced cardiac output at 3.1 with a cardiac index of 2.09, recommned mitral valve repair, acceptable volume status   Paroxysmal SVT (supraventricular tachycardia) ([facility])    PVC's (premature ventricular contractions)    a. 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 07/02/2016  Complex valvuloplasty including quadrangular resection of posterior leaflet, folding plasty, Gore-tex neochord placement x6 and 32 mm Sorin Memo 3D ring annuloplasty with clipping of LA appendage via right mini thoracotomy approach.   Severe mitral regurgitation    a. TTE 06/05/16: EF 60-65%, nl WM, LV dias fxn nl, mod prolapse of posterior mitral leaflet with mod eccentric regurg, LA mildly dilated, PASP nl; b. TEE 06/18/16: EF 65-70%, LV mildly dilated, no valvular vegetations seen throughout, mitral valve w/ flail motion involving the middle scallop of the post leaflet. Severe regurgitation directed eccentrically, LA mildly dilated   Tobacco abuse    Vertigo    no meds    Past Surgical History:  Procedure Laterality Date   CARDIAC CATHETERIZATION N/A 06/19/2016   Procedure: Right/Left Heart Cath and Coronary Angiography;  Surgeon: Wellington Hampshire, MD;  Location: [place] [facility];  Service: Cardiovascular;  Laterality: N/A;   CESAREAN SECTION     CHOLECYSTECTOMY     CLIPPING OF ATRIAL APPENDAGE N/A 07/02/2016   Procedure: CLIPPING OF ATRIAL APPENDAGE;  Surgeon: Rexene Alberts, MD;  Location: [place];  Service: Open Heart Surgery;  Laterality: N/A;   COLONOSCOPY N/A 12/07/2014   Procedure: COLONOSCOPY;  Surgeon: Lucilla Lame, MD;  Location: [place];  Service: Gastroenterology;  Laterality: N/A;   ESOPHAGOGASTRODUODENOSCOPY N/A 12/07/2014   Procedure: ESOPHAGOGASTRODUODENOSCOPY (EGD);  Surgeon: Lucilla Lame, MD;  Location: [place];  Service: Gastroenterology;  Laterality: N/A;   EYE SURGERY Bilateral    lasic   MITRAL VALVE REPAIR Right 07/02/2016   Procedure: MINIMALLY INVASIVE MITRAL VALVE REPAIR (MVR);  Surgeon: Rexene Alberts, MD;  Location: [place];  Service: Open Heart Surgery;  Laterality: Right;   TEE WITHOUT CARDIOVERSION N/A 06/18/2016   Procedure: TRANSESOPHAGEAL ECHOCARDIOGRAM (TEE);  Surgeon: Nelva Bush, MD;  Location: [facility];  Service: Cardiovascular;   Laterality: N/A;   TEE WITHOUT CARDIOVERSION N/A 07/02/2016   Procedure: TRANSESOPHAGEAL ECHOCARDIOGRAM (TEE);  Surgeon: Rexene Alberts, MD;  Location: [place];  Service: Open Heart Surgery;  Laterality: N/A;   TONSILLECTOMY      Current Medications: Current Meds  Medication Sig   amoxicillin (AMOXIL) 500 MG capsule Take by mouth as needed. Dental procedure   ascorbic acid (VITAMIN C) 500 MG tablet Take 500 mg by mouth daily.   aspirin EC 81 MG tablet Take 1 tablet (81 mg total) by mouth daily.   calcium-vitamin D (OSCAL WITH D) 500-200 MG-UNIT tablet Take 1 tablet by mouth.   cholecalciferol (VITAMIN D) 1000 units tablet Take 5,000 Units by mouth daily.   metoprolol succinate (TOPROL-XL) 50 MG 24 hr tablet Take 1 tablet (50 mg total) by mouth daily. Take with or immediately following a meal.   Multiple Vitamin (MULTIVITAMIN) tablet Take 1 tablet by mouth daily.   vitamin B-12 (CYANOCOBALAMIN) 1000 MCG tablet Take 1,000 mcg by mouth daily.   Zinc Citrate 16.67 MG CHEW Chew by mouth daily.   [DISCONTINUED] metoprolol succinate (TOPROL-XL) 25 MG 24 hr tablet TAKE 1 TABLET BY MOUTH ONCE DAILY (TAKE  WITH  OR  IMMEDIATELY  FOLLOWING  A  MEAL)     Allergies:   Patient has no known allergies.   Social History   Socioeconomic History   Marital status: Married    Spouse name: Not on file   Number of children: Not on file   Years of education: Not on file   Highest education level: Not on file  Occupational History   Not on file  Tobacco Use   Smoking status: Former    Packs/day: 0.50    Years: 45.00    Pack years: 22.50    Types: Cigarettes    Quit date: 07/02/2016    Years since quitting: 4.9   Smokeless tobacco: Never   Tobacco comments:    quit date 06/13/16  Vaping Use   Vaping Use: Never used  Substance and Sexual Activity   Alcohol use: Yes    Alcohol/week: 0.0 standard drinks    Comment: rarely   Drug use: No   Sexual activity: Not on file  Other Topics Concern    Not on file  Social History Narrative   Not on file   Social Determinants of Health   Financial Resource Strain: Not on file  Food Insecurity: Not on file  Transportation Needs: Not on file  Physical Activity: Not on file  Stress: Not on file  Social Connections: Not on file     Family History: The patient's family history includes Alzheimer's disease in her mother; Breast cancer in her sister; Breast cancer (age of onset: 69) in her sister; Cancer in her brother, brother, sister, and sister; Stroke in her father.  ROS:   Please see the history of present illness.    All other systems reviewed and are negative.  EKGs/Labs/Other Studies Reviewed:    The following studies were reviewed today:   EKG:  The ekg ordered today demonstrates  sinus rhythm with frequent PVCs.  PVCs have a left superior axis, right bundle morphology in V1 and transition to negative in V3.  Recent Labs: No results found for requested labs within last 8760 hours.  Recent Lipid Panel    Component Value Date/Time   CHOL 138 06/16/2016 0410   TRIG 112 06/16/2016 0410   HDL 50 06/16/2016 0410   CHOLHDL 2.8 06/16/2016 0410   VLDL 22 06/16/2016 0410   LDLCALC 66 06/16/2016 0410    Physical Exam:    VS:  BP 124/76   Pulse 94   Ht 4' 11.5" (1.511 m)   Wt 106 lb 9.6 oz (48.4 kg)   SpO2 98%   BMI 21.17 kg/m     Wt Readings from Last 3 Encounters:  06/26/21 106 lb 9.6 oz (48.4 kg)  06/27/20 106 lb (48.1 kg)  06/14/20 116 lb 2 oz (52.7 kg)     GEN:  Well nourished, well developed in no acute distress HEENT: Normal NECK: No JVD; No carotid bruits LYMPHATICS: No lymphadenopathy CARDIAC: Irregular rhythm, no murmurs, rubs, gallops RESPIRATORY:  Clear to auscultation without rales, wheezing or rhonchi  ABDOMEN: Soft, non-tender, non-distended MUSCULOSKELETAL:  No edema; No deformity  SKIN: Warm and dry NEUROLOGIC:  Alert and oriented x 3 PSYCHIATRIC:  Normal affect        ASSESSMENT:     1. Frequent PVCs   2. S/P minimally invasive mitral valve repair    PLAN:    In order of problems listed above:  #Frequent PVCs She does feel palpitations but are not significantly limiting.  We will check an echocardiogram today to make sure the LV function has remained stable.  I suspect the PVCs originating from the posterior medial Pap muscle within the LV.  We discussed the various treatment options available including alternative antiarrhythmic drugs such as mexiletine/amiodarone.  With a low symptom burden and preserved LV function on previous echo, I do not think proceeding to catheter ablation is indicated at this time.  I will start by increasing her metoprolol succinate to 50 mg by mouth once daily.  I will have her follow-up with [place] in 6 months.  If she continues to have symptoms or would like to try to suppress the PVCs, could consider a trial of mexiletine 150 mg by mouth twice daily.  Alternatively, if the echo shows depressed LV function may suppress with amiodarone.  #Post mitral valve repair Suspect this is contributing to the PVCs originating from the subvalvular apparatus.   Follow-up 6 months with an APP.   Medication Adjustments/Labs and Tests Ordered: Current medicines are reviewed at length with the patient today.  Concerns regarding medicines are outlined above.  Orders Placed This Encounter  Procedures   EKG 12-Lead   ECHOCARDIOGRAM COMPLETE   Meds ordered this encounter  Medications   metoprolol succinate (TOPROL-XL) 50 MG 24 hr tablet    Sig: Take 1 tablet (50 mg total) by mouth daily. Take with or immediately following a meal.    Dispense:  90 tablet    Refill:  3     Signed, Lars Mage, MD, [facility], [facility] 06/26/2021 10:41 AM    Electrophysiology [place]

## 2021-06-26 ENCOUNTER — Encounter: Payer: Self-pay | Admitting: Cardiology

## 2021-06-26 ENCOUNTER — Other Ambulatory Visit: Payer: Self-pay

## 2021-06-26 ENCOUNTER — Ambulatory Visit: Payer: PPO | Admitting: Cardiology

## 2021-06-26 VITALS — BP 124/76 | HR 94 | Ht 59.5 in | Wt 106.6 lb

## 2021-06-26 DIAGNOSIS — Z9889 Other specified postprocedural states: Secondary | ICD-10-CM

## 2021-06-26 DIAGNOSIS — I493 Ventricular premature depolarization: Secondary | ICD-10-CM | POA: Diagnosis not present

## 2021-06-26 MED ORDER — METOPROLOL SUCCINATE ER 50 MG PO TB24: 50.0000 mg | ORAL_TABLET | Freq: Every day | ORAL | 3 refills | Status: DC

## 2021-06-26 NOTE — Patient Instructions (Addendum)
Medication Instructions:   Your physician has recommended you make the following change in your medication:    INCREASE your metoprolol succinate-  Take 50 mg by mouth once a day  Lab Work: None ordered. If you have labs (blood work) drawn today and your tests are completely normal, you will receive your results only by: [place] (if you have MyChart) OR A paper copy in the mail If you have any lab test that is abnormal or we need to change your treatment, we will call you to review the results.  Testing/Procedures: Your physician has requested that you have an echocardiogram. Echocardiography is a painless test that uses sound waves to create images of your heart. It provides your doctor with information about the size and shape of your heart and how well your heart's chambers and valves are working. This procedure takes approximately one hour. There are no restrictions for this procedure.  Please schedule for ECHO  Follow-Up: At [facility], you and your health needs are our priority.  As part of our continuing mission to provide you with exceptional heart care, we have created designated Provider Care Teams.  These Care Teams include your primary Cardiologist (physician) and Advanced Practice Providers (APPs -  Physician Assistants and Nurse Practitioners) who all work together to provide you with the care you need, when you need it.  Your next appointment:   Your physician wants you to follow-up in: 6 months with one of the following Advanced Practice Providers on your designated Care Team:   Murray Hodgkins, NP Christell Faith, PA-C Marrianne Mood, PA-C Cadence Kathlen Mody, PA-C You will receive a reminder letter in the mail two months in advance. If you don't receive a letter, please call our office to schedule the follow-up appointment.

## 2021-08-06 ENCOUNTER — Ambulatory Visit (INDEPENDENT_AMBULATORY_CARE_PROVIDER_SITE_OTHER): Payer: PPO

## 2021-08-06 ENCOUNTER — Other Ambulatory Visit: Payer: Self-pay

## 2021-08-06 DIAGNOSIS — Z9889 Other specified postprocedural states: Secondary | ICD-10-CM | POA: Diagnosis not present

## 2021-08-06 DIAGNOSIS — Z954 Presence of other heart-valve replacement: Secondary | ICD-10-CM

## 2021-08-06 DIAGNOSIS — I493 Ventricular premature depolarization: Secondary | ICD-10-CM

## 2021-08-06 LAB — ECHOCARDIOGRAM COMPLETE
AR max vel: 2.75 cm2
AV Area VTI: 2.66 cm2
AV Area mean vel: 2.26 cm2
AV Mean grad: 2 mmHg
AV Peak grad: 3.5 mmHg
Ao pk vel: 0.93 m/s
Area-P 1/2: 3.46 cm2
Calc EF: 58.1 %
MV VTI: 1.69 cm2
S' Lateral: 2.4 cm
Single Plane A2C EF: 62.5 %
Single Plane A4C EF: 56.4 %

## 2021-08-13 ENCOUNTER — Other Ambulatory Visit: Payer: Self-pay

## 2021-08-13 ENCOUNTER — Ambulatory Visit: Payer: PPO | Admitting: Podiatry

## 2021-08-13 ENCOUNTER — Telehealth: Payer: Self-pay | Admitting: *Deleted

## 2021-08-13 DIAGNOSIS — L539 Erythematous condition, unspecified: Secondary | ICD-10-CM | POA: Diagnosis not present

## 2021-08-13 DIAGNOSIS — T148XXA Other injury of unspecified body region, initial encounter: Secondary | ICD-10-CM

## 2021-08-13 MED ORDER — SULFAMETHOXAZOLE-TRIMETHOPRIM 800-160 MG PO TABS: 1.0000 | ORAL_TABLET | Freq: Two times a day (BID) | ORAL | 0 refills | Status: DC

## 2021-08-13 MED ORDER — DOXYCYCLINE HYCLATE 100 MG PO TABS: 100.0000 mg | ORAL_TABLET | Freq: Two times a day (BID) | ORAL | 0 refills | Status: AC

## 2021-08-13 NOTE — Telephone Encounter (Signed)
"  I was there this morning.  Dr. Posey Pronto sent a prescription to my pharmacy.  They said it costs $80.  I'm on a fixed income, that's all I have.  Can he call in a generic?"  I will send him your message.

## 2021-08-15 NOTE — Progress Notes (Signed)
Subjective:  Patient ID: Nicole Norman, female    DOB: 12-28-1945,  MRN: [id number]  Chief Complaint  Patient presents with   Callouses    Right foot place between 4th and 5th toe causing pain and discomfort     76 y.o. female presents with the above complaint.  Patient presents with concern for right interdigital fourth and fifth digit blister formation.  Patient states that is causing pain and discomfort.  Is been there for quite some time is progressive gotten worse.  She does wear spacer in between the toes that could have likely caused a friction blister.  She states it hurts with ambulation.  She wanted get it evaluated.  She has not seen anyone else prior to seeing me.  There might be a little bit of infection present as well.  She does not have diabetes.   Review of Systems: Negative except as noted in the HPI. Denies N/V/F/Ch.  Past Medical History:  Diagnosis Date   CHF (congestive heart failure) ([place])    a. In setting of severe MR; b. 06/2016 TEE: EF 65-60%.   COPD (chronic obstructive pulmonary disease) ([facility])    pt. denies   History of bronchitis    History of pneumonia    Incidental pulmonary nodule 06/13/2016   Multiple small ground glass opacities seen on chest CT scan   Migraine    1x/month - uses Aleve   Normal coronary arteries    a. cardiac cath 06/19/2016: normal coronary arteries and left dominant system, LVEDP nl, 4+ mitral regurgitation, RHC showed mildly elevated LVEDP (48mmHg), giant V waves noted on PCWP w/ mild pulmonary HTN., pulmonary pressure 39/10 with a mean of 27 mmHg, mildly reduced cardiac output at 3.1 with a cardiac index of 2.09, recommned mitral valve repair, acceptable volume status   Paroxysmal SVT (supraventricular tachycardia) ([facility])    PVC's (premature ventricular contractions)    a. 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 07/02/2016 Complex valvuloplasty including  quadrangular resection of posterior leaflet, folding plasty, Gore-tex neochord placement x6 and 32 mm Sorin Memo 3D ring annuloplasty with clipping of LA appendage via right mini thoracotomy approach.   Severe mitral regurgitation    a. TTE 06/05/16: EF 60-65%, nl WM, LV dias fxn nl, mod prolapse of posterior mitral leaflet with mod eccentric regurg, LA mildly dilated, PASP nl; b. TEE 06/18/16: EF 65-70%, LV mildly dilated, no valvular vegetations seen throughout, mitral valve w/ flail motion involving the middle scallop of the post leaflet. Severe regurgitation directed eccentrically, LA mildly dilated   Tobacco abuse    Vertigo    no meds    Current Outpatient Medications:    doxycycline (VIBRA-TABS) 100 MG tablet, Take 1 tablet (100 mg total) by mouth 2 (two) times daily for 14 days., Disp: 28 tablet, Rfl: 0   sulfamethoxazole-trimethoprim (BACTRIM DS) 800-160 MG tablet, Take 1 tablet by mouth 2 (two) times daily., Disp: 28 tablet, Rfl: 0   amoxicillin (AMOXIL) 500 MG capsule, Take by mouth as needed. Dental procedure, Disp: , Rfl:    ascorbic acid (VITAMIN C) 500 MG tablet, Take 500 mg by mouth daily., Disp: , Rfl:    aspirin EC 81 MG tablet, Take 1 tablet (81 mg total) by mouth daily., Disp: , Rfl:    calcium-vitamin D (OSCAL WITH D) 500-200 MG-UNIT tablet, Take 1 tablet by mouth., Disp: , Rfl:    cholecalciferol (VITAMIN D) 1000  units tablet, Take 5,000 Units by mouth daily., Disp: , Rfl:    metoprolol succinate (TOPROL-XL) 50 MG 24 hr tablet, Take 1 tablet (50 mg total) by mouth daily. Take with or immediately following a meal., Disp: 90 tablet, Rfl: 3   Multiple Vitamin (MULTIVITAMIN) tablet, Take 1 tablet by mouth daily., Disp: , Rfl:    vitamin B-12 (CYANOCOBALAMIN) 1000 MCG tablet, Take 1,000 mcg by mouth daily., Disp: , Rfl:    Zinc Citrate 16.67 MG CHEW, Chew by mouth daily., Disp: , Rfl:   Social History   Tobacco Use  Smoking Status Former   Packs/day: 0.50   Years: 45.00    Pack years: 22.50   Types: Cigarettes   Quit date: 07/02/2016   Years since quitting: 5.1  Smokeless Tobacco Never  Tobacco Comments   quit date 06/13/16    No Known Allergies Objective:  There were no vitals filed for this visit. There is no height or weight on file to calculate BMI. Constitutional Well developed. Well nourished.  Vascular Dorsalis pedis pulses palpable bilaterally. Posterior tibial pulses palpable bilaterally. Capillary refill normal to all digits.  No cyanosis or clubbing noted. Pedal hair growth normal.  Neurologic Normal speech. Oriented to person, place, and time. Epicritic sensation to light touch grossly present bilaterally.  Dermatologic Right fourth and fifth digit/interdigital space friction blister.  Blister was taken down superficial purulent drainage noted.  No ulceration noted.  Underlying skin was pink and granular.  Mild erythema noted.  No fluctuance or crepitus noted.  No concern for osteomyelitis at this time.  Orthopedic: Normal joint ROM without pain or crepitus bilaterally. No visible deformities. No bony tenderness.   Radiographs: None Assessment:   1. Friction blister   2. Erythema    Plan:  Patient was evaluated and treated and all questions answered.  Right fourth and fifth digit friction blister -All questions and concerns were discussed with the patient in extensive detail -At this time patient will benefit from doxycycline given that there is some erythema present and superficial purulent drainage was expressed.  Patient agrees with the plan would like to obtain doxycycline.  I have asked her to complete the course.  She states understanding -Betadine wet-to-dry dressing every day -Patient has surgical shoe at home I will asked her to place it to allow to take the pressure off in between the toes.   No follow-ups on file.

## 2021-09-20 DIAGNOSIS — Z1211 Encounter for screening for malignant neoplasm of colon: Secondary | ICD-10-CM | POA: Diagnosis not present

## 2021-09-20 DIAGNOSIS — Z720 Tobacco use: Secondary | ICD-10-CM | POA: Diagnosis not present

## 2021-09-20 DIAGNOSIS — K581 Irritable bowel syndrome with constipation: Secondary | ICD-10-CM | POA: Diagnosis not present

## 2021-09-20 DIAGNOSIS — I493 Ventricular premature depolarization: Secondary | ICD-10-CM | POA: Diagnosis not present

## 2021-09-20 DIAGNOSIS — Z86010 Personal history of colonic polyps: Secondary | ICD-10-CM | POA: Diagnosis not present

## 2021-09-26 DIAGNOSIS — Z9889 Other specified postprocedural states: Secondary | ICD-10-CM | POA: Diagnosis not present

## 2021-09-26 DIAGNOSIS — I493 Ventricular premature depolarization: Secondary | ICD-10-CM | POA: Diagnosis not present

## 2021-09-26 DIAGNOSIS — Z0000 Encounter for general adult medical examination without abnormal findings: Secondary | ICD-10-CM | POA: Diagnosis not present

## 2021-09-26 DIAGNOSIS — Z720 Tobacco use: Secondary | ICD-10-CM | POA: Diagnosis not present

## 2021-09-27 ENCOUNTER — Telehealth: Payer: Self-pay | Admitting: Cardiovascular Disease

## 2021-09-27 NOTE — Telephone Encounter (Signed)
° °  Patient Name: Nicole Norman  DOB: Apr 09, 1946 MRN: [id number]  Primary Cardiologist: None  Chart reviewed as part of pre-operative protocol coverage. Patient was contacted on 09/27/2021 as part of preoperative screening process. No answer. Left voicemail message with request for patient to call back.  Lenna Sciara, NP 09/27/2021, 1:45 PM

## 2021-09-27 NOTE — Telephone Encounter (Signed)
° °  Pre-operative Risk Assessment    Patient Name: Nicole Norman  DOB: [birth year] MRN: [id number]     Request for Surgical Clearance    Procedure:   2 extractions and potential bone graft   Date of Surgery:  Clearance TBD                                 Surgeon:  not noted  Surgeon's Group or Practice Name:  [facility]  Phone number:  [phone] Fax number:  [phone]   Type of Clearance Requested:   - Medical  - Pharmacy:  Hold    please advise   Type of Anesthesia:  Local    Additional requests/questions:    Jonathon Jordan   09/27/2021, 1:28 PM

## 2021-10-03 NOTE — Telephone Encounter (Signed)
[facility] office is calling back to see the status of dental clearance.

## 2021-10-03 NOTE — Telephone Encounter (Signed)
? ?  Primary Cardiologist: Kathlyn Sacramento, MD ? ?Chart reviewed as part of pre-operative protocol coverage. Given past medical history and time since last visit, based on ACC/AHA guidelines, Nicole Norman would be at acceptable risk for the planned procedure without further cardiovascular testing.  ? ?She does require SBE prophylaxis.  She has a prescription for amoxicillin.  Her aspirin may be held for 7 days prior to her procedure.  Please resume as soon as hemostasis is achieved. ? ?I will route this recommendation to the requesting party via Epic fax function and remove from pre-op pool. ? ?Please call with questions. ? ?Nicole Norman. Nicole Lewers NP-C ? ?  ?10/03/2021, 11:59 AM ?[place] ?[address] ?Office [phone] Fax [phone] ? ? ? ? ?

## 2021-10-03 NOTE — Telephone Encounter (Signed)
Nicole Norman 76 year old female is requesting preoperative cardiac evaluation for teeth extraction x2 and potential bone grafting.  She was last seen in the clinic on 06/26/2021.  During that time she continues to well.  She did note palpitations.  She reported feeling better after discontinuation of flecainide.  She denied presyncope and syncope.  Follow-up echocardiogram 08/06/2021 showed normal LVEF 55-60%, mild LVH, and G1 DD.  Her repaired mitral valve showed trivial mitral valve regurgitation and no evidence of stenosis.  Valve is functioning well. ? ?Her PMH includes minimally invasive mitral valve repair, COPD, normal coronary arteries, paroxysmal SVT, PVCs, and vertigo. ? ? ?May her aspirin be held prior to her procedure? ? ?Thank you for your help.  Please direct your response to [facility] preop pool. ? ?Jossie Ng. Livian Vanderbeck NP-C ? ?  ?10/03/2021, 10:59 AM ?[place] ?[address] ?Office [phone] Fax [phone] ? ?

## 2021-10-03 NOTE — Telephone Encounter (Signed)
Yes, aspirin can be held 1 week before. ?

## 2021-10-03 NOTE — Telephone Encounter (Signed)
I will fax notes to the requesting office  ?

## 2021-10-14 DIAGNOSIS — E875 Hyperkalemia: Secondary | ICD-10-CM | POA: Diagnosis not present

## 2021-10-21 ENCOUNTER — Emergency Department: Payer: PPO

## 2021-10-21 ENCOUNTER — Emergency Department
Admission: EM | Admit: 2021-10-21 | Discharge: 2021-10-21 | Disposition: A | Discharge status: Home / Self Care | Payer: PPO | Attending: Emergency Medicine | Admitting: Emergency Medicine

## 2021-10-21 ENCOUNTER — Other Ambulatory Visit: Payer: Self-pay

## 2021-10-21 DIAGNOSIS — G43109 Migraine with aura, not intractable, without status migrainosus: Secondary | ICD-10-CM | POA: Diagnosis not present

## 2021-10-21 DIAGNOSIS — J449 Chronic obstructive pulmonary disease, unspecified: Secondary | ICD-10-CM | POA: Diagnosis not present

## 2021-10-21 DIAGNOSIS — R202 Paresthesia of skin: Secondary | ICD-10-CM | POA: Insufficient documentation

## 2021-10-21 DIAGNOSIS — R519 Headache, unspecified: Secondary | ICD-10-CM | POA: Diagnosis not present

## 2021-10-21 DIAGNOSIS — J984 Other disorders of lung: Secondary | ICD-10-CM | POA: Diagnosis not present

## 2021-10-21 DIAGNOSIS — R200 Anesthesia of skin: Secondary | ICD-10-CM | POA: Diagnosis not present

## 2021-10-21 DIAGNOSIS — I10 Essential (primary) hypertension: Secondary | ICD-10-CM | POA: Diagnosis not present

## 2021-10-21 DIAGNOSIS — R0602 Shortness of breath: Secondary | ICD-10-CM | POA: Diagnosis not present

## 2021-10-21 DIAGNOSIS — D72829 Elevated white blood cell count, unspecified: Secondary | ICD-10-CM | POA: Diagnosis not present

## 2021-10-21 LAB — URINALYSIS, ROUTINE W REFLEX MICROSCOPIC
Bilirubin Urine: NEGATIVE
Glucose, UA: NEGATIVE mg/dL
Hgb urine dipstick: NEGATIVE
Ketones, ur: NEGATIVE mg/dL
Leukocytes,Ua: NEGATIVE
Nitrite: NEGATIVE
Protein, ur: NEGATIVE mg/dL
Specific Gravity, Urine: 1.012 (ref 1.005–1.030)
pH: 6 (ref 5.0–8.0)

## 2021-10-21 LAB — CBC
HCT: 44.4 % (ref 36.0–46.0)
Hemoglobin: 14.6 g/dL (ref 12.0–15.0)
MCH: 30.7 pg (ref 26.0–34.0)
MCHC: 32.9 g/dL (ref 30.0–36.0)
MCV: 93.5 fL (ref 80.0–100.0)
Platelets: 291 10*3/uL (ref 150–400)
RBC: 4.75 MIL/uL (ref 3.87–5.11)
RDW: 12.5 % (ref 11.5–15.5)
WBC: 12.1 10*3/uL — ABNORMAL HIGH (ref 4.0–10.5)
nRBC: 0 % (ref 0.0–0.2)

## 2021-10-21 LAB — BASIC METABOLIC PANEL
Anion gap: 10 (ref 5–15)
BUN: 18 mg/dL (ref 8–23)
CO2: 27 mmol/L (ref 22–32)
Calcium: 9.6 mg/dL (ref 8.9–10.3)
Chloride: 103 mmol/L (ref 98–111)
Creatinine, Ser: 0.8 mg/dL (ref 0.44–1.00)
GFR, Estimated: >60 mL/min (ref >60)
Glucose, Bld: 97 mg/dL (ref 70–99)
Potassium: 4.2 mmol/L (ref 3.5–5.1)
Sodium: 140 mmol/L (ref 135–145)

## 2021-10-21 NOTE — ED Triage Notes (Addendum)
Patient to ER via [place] with husband. Reports two episodes this morning where she experienced her entire body becoming weak and heavy, and head pressure. Reports she then has to sit down and she feels better, but requires assistance to do so. Did not lose consciousness. No blood thinner usage. ? ?At this time, patient states she feels like her head is throbbing and generalized weakness.  ? ?Patient reports multiple episodes in the past with the same symptoms. States she has never had symptoms this severe or two episodes in close proximity. Hx of vertigo.  ?

## 2021-10-21 NOTE — Discharge Instructions (Addendum)
-  Follow-up with the neurologist listed above, as discussed. ?-Follow-up with your primary care provider and cardiologist as needed ?- ?

## 2021-10-21 NOTE — ED Provider Notes (Signed)
? ?[facility] ?Provider Note ? ? ? Event Date/Time  ? First MD Initiated Contact with Patient 10/21/21 1314   ?  (approximate) ? ? ?History  ? ?Chief Complaint ?Weakness ? ? ?HPI ?Nicole Norman is a 76 y.o. female, history of COPD, severe mitral regurgitation, SVT, pulmonary hypertension, migraine, presents to the emergency department for evaluation of headache and weakness.  Patient states that she has a long history of headaches associated with numbness/tingling sensation in her arms and her legs.  She has not been evaluated for this before recently.  She states that this morning, her headache was particularly bad in the numbness sensation in her legs was so bad that she was unable to walk.  She states that over the past few hours, her pain has improved and she is now able to walk again, but still feels weak.  Denies fever/chills, chest pain, shortness of breath, abdominal pain, nausea/vomiting, diarrhea,  ? ?History Limitations: No limitations. ? ?  ? ? ?Physical Exam  ?Triage Vital Signs: ?ED Triage Vitals  ?Enc Vitals Group  ?   BP 10/21/21 1153 137/84  ?   Pulse Rate 10/21/21 1153 82  ?   Resp 10/21/21 1153 18  ?   Temp 10/21/21 1153 98 ?F (36.7 ?C)  ?   Temp Source 10/21/21 1153 Oral  ?   SpO2 10/21/21 1153 99 %  ?   Weight --   ?   Height 10/21/21 1154 '4\' 11"'$  (1.499 m)  ?   Head Circumference --   ?   Peak Flow --   ?   Pain Score 10/21/21 1153 10  ?   Pain Loc --   ?   Pain Edu? --   ?   Excl. in [place]? --   ? ? ?Most recent vital signs: ?Vitals:  ? 10/21/21 1330 10/21/21 1345  ?BP: 121/74   ?Pulse: 70   ?Resp: (!) 21 14  ?Temp:    ?SpO2: 98%   ? ? ?General: Awake, NAD.  ?Skin: Warm, dry.  ?CV: Good peripheral perfusion.  ?Resp: Normal effort.  Lung sounds clear bilaterally. ?Abd: Soft, non-tender. No distention.  ?Neuro: At baseline. No gross neurological deficits.  Cranial nerves II through XII intact.  5/5 strength in upper and lower extremities.  Normal pulse, motor, sensation  in all extremities.  Patient is able to ambulate across the room now. ?Other: Not applicable. ? ?Physical Exam ? ? ? ?ED Results / Procedures / Treatments  ?Labs ?(all labs ordered are listed, but only abnormal results are displayed) ?Labs Reviewed  ?CBC - Abnormal; Notable for the following components:  ?    Result Value  ? WBC 12.1 (*)   ? All other components within normal limits  ?URINALYSIS, ROUTINE W REFLEX MICROSCOPIC - Abnormal; Notable for the following components:  ? Color, Urine YELLOW (*)   ? APPearance CLEAR (*)   ? All other components within normal limits  ?BASIC METABOLIC PANEL  ?CBG MONITORING, ED  ? ? ? ?EKG ?Sinus rhythm, rate of 83, no T-segment changes, no AV blocks, no axis deviations, normal QRS interval. ? ? ?RADIOLOGY ? ?ED Provider Interpretation: I personally reviewed and interpreted these images.  Mild bibasilar linear scarring on chest x-ray.  Head CT shows no evidence of acute abnormalities. ? ?DG Chest 1 View ? ?Result Date: 10/21/2021 ?CLINICAL DATA:  Weakness and shortness of breath. EXAM: CHEST  1 VIEW COMPARISON:  10/20/2016 and CT chest 06/14/2016. FINDINGS: Trachea  is midline. Heart size stable. Left atrial appendage clip. Thoracic aorta is calcified. Mild bibasilar interstitial prominence with questionable faint peripheral septal lines. No definite pleural fluid. IMPRESSION: Bibasilar linear scarring versus mild pulmonary edema. Electronically Signed   By: Lorin Picket M.D.   On: 10/21/2021 14:09  ? ?CT Head Wo Contrast ? ?Result Date: 10/21/2021 ?CLINICAL DATA:  Headache EXAM: CT HEAD WITHOUT CONTRAST TECHNIQUE: Contiguous axial images were obtained from the base of the skull through the vertex without intravenous contrast. RADIATION DOSE REDUCTION: This exam was performed according to the departmental dose-optimization program which includes automated exposure control, adjustment of the mA and/or kV according to patient size and/or use of iterative reconstruction technique.  COMPARISON:  CT head 07/15/2013 FINDINGS: Brain: No acute intracranial hemorrhage, mass effect, or herniation. No extra-axial fluid collections. No evidence of acute territorial infarct. No hydrocephalus. Vascular: Calcified plaques in the carotid siphons. Skull: Normal. Negative for fracture or focal lesion. Sinuses/Orbits: No acute finding. Mild opacification in the right mastoid air cells noted, chronic. Other: None. IMPRESSION: No acute intracranial process identified. Electronically Signed   By: Ofilia Neas M.D.   On: 10/21/2021 14:20   ? ?PROCEDURES: ? ?Critical Care performed: None. ? ?Procedures ? ? ? ?MEDICATIONS ORDERED IN ED: ?Medications - No data to display ? ? ?IMPRESSION / MDM / ASSESSMENT AND PLAN / ED COURSE  ?I reviewed the triage vital signs and the nursing notes. ?             ?               ? ? ?Differential diagnosis includes, but is not limited to, stroke, complex migraine, tension headache, cluster headache, pneumonia, UTI, arrhythmia ? ?ED Course ?Patient appears well.  Vital signs within normal limits.  NAD.  Patient states that her headache is manageable at this point without the need of any medications. ? ?CBC notable for leukocytosis at 12.1, otherwise unremarkable. ? ?BMP shows no evidence of electrolyte abnormalities or kidney injury. ? ?Head CT shows no acute abnormalities. ? ?Assessment/Plan ?Presentation consistent with complex migraine given the paresthesias in her upper and lower extremities.  Patient states that she is no longer symptomatic at this point.  Head CT is reassuring for no acute intracranial abnormalities.  No further work-up or treatment indicated at this time.  We will have this patient follow-up with neurology outpatient for further management. ? ?Patient was provided with anticipatory guidance, return precautions, and educational material. Encouraged the patient to return to the emergency department at any time if they begin to experience any new or  worsening symptoms.  ? ?  ? ? ?FINAL CLINICAL IMPRESSION(S) / ED DIAGNOSES  ? ?Final diagnoses:  ?Acute nonintractable headache, unspecified headache type  ? ? ? ?Rx / DC Orders  ? ?ED Discharge Orders   ? ? None  ? ?  ? ? ? ?Note:  This document was prepared using Dragon voice recognition software and may include unintentional dictation errors. ?  ?Teodoro Spray, [place] ?10/21/21 2020 ? ?  ?Naaman Plummer, MD ?10/22/21 1517 ? ?

## 2021-10-28 DIAGNOSIS — E875 Hyperkalemia: Secondary | ICD-10-CM | POA: Diagnosis not present

## 2021-10-30 DIAGNOSIS — G43009 Migraine without aura, not intractable, without status migrainosus: Secondary | ICD-10-CM | POA: Diagnosis not present

## 2021-11-05 NOTE — Progress Notes (Deleted)
? ?Cardiology Office Note   ? ?Date:  11/05/2021  ? ?ID:  Nicole Norman, DOB May 14, 1946, MRN [id number] ? ?PCP:  Dion Body, MD  ?Cardiologist:  Kathlyn Sacramento, MD  ?Electrophysiologist:  Vickie Epley, MD  ? ?Chief Complaint: *** ? ?History of Present Illness:  ? ?Nicole Norman is a 76 y.o. female with history of *** ? ?*** ? ? ?Labs independently reviewed: ?10/2021 - potassium 4.9, BUN 15, serum creatinine 1.0, Hgb 14.6, PLT 291 ?09/2021 - albumin 4.2, AST/ALT normal, TC 193, TG 113, HDL 52, LDL 118 ?08/2019 - TSH normal ? ?Past Medical History:  ?Diagnosis Date  ? CHF (congestive heart failure) ([place])   ? a. In setting of severe MR; b. 06/2016 TEE: EF 65-60%.  ? COPD (chronic obstructive pulmonary disease) ([place])   ? pt. denies  ? History of bronchitis   ? History of pneumonia   ? Incidental pulmonary nodule 06/13/2016  ? Multiple small ground glass opacities seen on chest CT scan  ? Migraine   ? 1x/month - uses Aleve  ? Normal coronary arteries   ? a. cardiac cath 06/19/2016: normal coronary arteries and left dominant system, LVEDP nl, 4+ mitral regurgitation, RHC showed mildly elevated LVEDP (103mHg), giant V waves noted on PCWP w/ mild pulmonary HTN., pulmonary pressure 39/10 with a mean of 27 mmHg, mildly reduced cardiac output at 3.1 with a cardiac index of 2.09, recommned mitral valve repair, acceptable volume status  ? Paroxysmal SVT (supraventricular tachycardia) (H[facility])   ? PVC's (premature ventricular contractions)   ? a. Holter 2015 with 26,000 PVCs in 48 hours  ? 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 a. 07/02/2016 Complex valvuloplasty including quadrangular resection of posterior leaflet, folding plasty, Gore-tex neochord placement x6 and 32 mm Sorin Memo 3D ring annuloplasty with clipping of LA appendage via right mini thoracotomy approach.  ? Severe mitral regurgitation   ? a. TTE 06/05/16: EF 60-65%, nl WM, LV dias fxn nl, mod prolapse of posterior mitral leaflet  with mod eccentric regurg, LA mildly dilated, PASP nl; b. TEE 06/18/16: EF 65-70%, LV mildly dilated, no valvular vegetations seen throughout, mitral valve w/ flail motion involving the middle scallop of the post leaflet. Severe regurgitation directed eccentrically, LA mildly dilated  ? Tobacco abuse   ? Vertigo   ? no meds  ? ? ?Past Surgical History:  ?Procedure Laterality Date  ? CARDIAC CATHETERIZATION N/A 06/19/2016  ? Procedure: Right/Left Heart Cath and Coronary Angiography;  Surgeon: MWellington Hampshire MD;  [facility];  Service: Cardiovascular;  Laterality: N/A;  ? CESAREAN SECTION    ? CHOLECYSTECTOMY    ? CLIPPING OF ATRIAL APPENDAGE N/A 07/02/2016  ? Procedure: CLIPPING OF ATRIAL APPENDAGE;  Surgeon: CRexene Alberts MD;  [facility]: [facility]  Service: Open Heart Surgery;  Laterality: N/A;  ? COLONOSCOPY N/A 12/07/2014  ? Procedure: COLONOSCOPY;  Surgeon: DLucilla Lame MD;  [facility]  Service: Gastroenterology;  Laterality: N/A;  ? ESOPHAGOGASTRODUODENOSCOPY N/A 12/07/2014  ? Procedure: ESOPHAGOGASTRODUODENOSCOPY (EGD);  Surgeon: DLucilla Lame MD;  [facility]  Service: Gastroenterology;  Laterality: N/A;  ? EYE SURGERY Bilateral   ? lasic  ? MITRAL VALVE REPAIR Right 07/02/2016  ? Procedure: MINIMALLY INVASIVE MITRAL VALVE REPAIR (MVR);  Surgeon: CRexene Alberts MD;  [facility]  Service: Open Heart Surgery;  Laterality: Right;  ? TEE WITHOUT CARDIOVERSION N/A 06/18/2016  ? Procedure: TRANSESOPHAGEAL ECHOCARDIOGRAM (TEE);  Surgeon: CHarrell Gave  End, MD;  Location: [facility];  Service: Cardiovascular;  Laterality: N/A;  ? TEE WITHOUT CARDIOVERSION N/A 07/02/2016  ? Procedure: TRANSESOPHAGEAL ECHOCARDIOGRAM (TEE);  Surgeon: Rexene Alberts, MD;  Location: [facility];  Service: Open Heart Surgery;  Laterality: N/A;  ? TONSILLECTOMY    ? ? ?Current Medications: ?No outpatient medications have been marked as taking for the 11/07/21 encounter (Appointment) with  Rise Mu, PA-C.  ? ? ?Allergies:   Patient has no known allergies.  ? ?Social History  ? ?Socioeconomic History  ? Marital status: Married  ?  Spouse name: Not on file  ? Number of children: Not on file  ? Years of education: Not on file  ? Highest education level: Not on file  ?Occupational History  ? Not on file  ?Tobacco Use  ? Smoking status: Former  ?  Packs/day: 0.50  ?  Years: 45.00  ?  Pack years: 22.50  ?  Types: Cigarettes  ?  Quit date: 07/02/2016  ?  Years since quitting: 5.3  ? Smokeless tobacco: Never  ? Tobacco comments:  ?  quit date 06/13/16  ?Vaping Use  ? Vaping Use: Never used  ?Substance and Sexual Activity  ? Alcohol use: Yes  ?  Alcohol/week: 0.0 standard drinks  ?  Comment: rarely  ? Drug use: No  ? Sexual activity: Not on file  ?Other Topics Concern  ? Not on file  ?Social History Narrative  ? Not on file  ? ?Social Determinants of Health  ? ?Financial Resource Strain: Not on file  ?Food Insecurity: Not on file  ?Transportation Needs: Not on file  ?Physical Activity: Not on file  ?Stress: Not on file  ?Social Connections: Not on file  ?  ? ?Family History:  ?The patient's family history includes Alzheimer's disease in her mother; Breast cancer in her sister; Breast cancer (age of onset: 41) in her sister; Cancer in her brother, brother, sister, and sister; Stroke in her father. ? ?ROS:   ?ROS ? ? ?EKGs/Labs/Other Studies Reviewed:   ? ?Studies reviewed were summarized above. The additional studies were reviewed today: ? ?2D echo 08/06/2021: ?1. Left ventricular ejection fraction, by estimation, is 55 to 60%. The  ?left ventricle has normal function. The left ventricle has no regional  ?wall motion abnormalities. There is mild left ventricular hypertrophy.  ?Left ventricular diastolic parameters  ?are consistent with Grade I diastolic dysfunction (impaired relaxation).  ? 2. Right ventricular systolic function is normal. The right ventricular  ?size is normal. Tricuspid regurgitation signal  is inadequate for assessing  ?PA pressure.  ? 3. The mitral valve has been repaired/replaced. Trivial mitral valve  ?regurgitation. No evidence of mitral stenosis. The mean mitral valve  ?gradient is 4.0 mmHg. There is a 32 mm [employer] prosthetic annuloplasty  ?ring present in the mitral position.  ?Procedure Date: 07/02/16.  ? 4. The aortic valve is normal in structure. Aortic valve regurgitation is  ?not visualized. No aortic stenosis is present.  ? 5. The inferior vena cava is normal in size with greater than 50%  ?respiratory variability, suggesting right atrial pressure of 3 mmHg.  ? ?Comparison(s): LVEF 55-60%. ?__________ ? ?2D echo 10/12/2019: ?1. Left ventricular ejection fraction, by estimation, is 55 to 60%. The  ?left ventricle has normal function. The left ventricle has no regional  ?wall motion abnormalities. Left ventricular diastolic parameters are  ?consistent with Grade II diastolic  ?dysfunction (pseudonormalization).  ? 2. Right ventricular systolic function is normal. The  right ventricular  ?size is normal.  ? 3. Heart rate 82bpm. The mitral valve has been repaired/replaced. No  ?evidence of mitral valve regurgitation. The mean mitral valve gradient is  ?2.0 mmHg. There is a prosthetic annuloplasty ring present in the mitral  ?position. Procedure Date: 07/02/2016.  ? 4. The aortic valve is tricuspid. Aortic valve regurgitation is not  ?visualized.  ? 5. The inferior vena cava is normal in size with greater than 50%  ?respiratory variability, suggesting right atrial pressure of 3 mmHg. ?__________ ? ?Zio patch 09/2019: ?Normal sinus rhythm with an average heart rate of 77 bpm. ?Frequent PVCs with a burden of 10.4%.  Some in the form of bigeminy and trigeminy. ?1 short run of SVT lasting 4 beats. ?__________ ? ?48-hour Holter 05/2018: ?Normal sinus rhythm with an average heart rate of 90 bpm. ?Frequent PVCs with a total of 16,000 beats representing 6% burden.  Some in the form of bigeminy in  addition to short ventricular runs. ?__________ ? ?2D echo 10/30/2017: ?- Left ventricle: The cavity size was normal. Systolic function was  ?  normal. The estimated ejection fraction was in the range of 55%

## 2021-11-07 ENCOUNTER — Ambulatory Visit: Payer: PPO | Admitting: Physician Assistant

## 2021-11-19 DIAGNOSIS — R200 Anesthesia of skin: Secondary | ICD-10-CM | POA: Diagnosis not present

## 2021-11-19 DIAGNOSIS — G43109 Migraine with aura, not intractable, without status migrainosus: Secondary | ICD-10-CM | POA: Diagnosis not present

## 2021-11-19 DIAGNOSIS — R531 Weakness: Secondary | ICD-10-CM | POA: Diagnosis not present

## 2021-11-19 DIAGNOSIS — R202 Paresthesia of skin: Secondary | ICD-10-CM | POA: Diagnosis not present

## 2021-12-25 ENCOUNTER — Ambulatory Visit: Payer: PPO | Admitting: Cardiology

## 2022-01-23 DIAGNOSIS — R519 Headache, unspecified: Secondary | ICD-10-CM | POA: Diagnosis not present

## 2022-01-23 DIAGNOSIS — G43109 Migraine with aura, not intractable, without status migrainosus: Secondary | ICD-10-CM | POA: Diagnosis not present

## 2022-01-23 DIAGNOSIS — R531 Weakness: Secondary | ICD-10-CM | POA: Diagnosis not present

## 2022-01-23 DIAGNOSIS — R200 Anesthesia of skin: Secondary | ICD-10-CM | POA: Diagnosis not present

## 2022-01-23 DIAGNOSIS — R202 Paresthesia of skin: Secondary | ICD-10-CM | POA: Diagnosis not present

## 2022-01-29 DIAGNOSIS — K5909 Other constipation: Secondary | ICD-10-CM | POA: Diagnosis not present

## 2022-01-29 DIAGNOSIS — Z86010 Personal history of colonic polyps: Secondary | ICD-10-CM | POA: Diagnosis not present

## 2022-01-29 DIAGNOSIS — Z720 Tobacco use: Secondary | ICD-10-CM | POA: Diagnosis not present

## 2022-02-11 DIAGNOSIS — R200 Anesthesia of skin: Secondary | ICD-10-CM | POA: Diagnosis not present

## 2022-02-11 DIAGNOSIS — R202 Paresthesia of skin: Secondary | ICD-10-CM | POA: Diagnosis not present

## 2022-03-06 DIAGNOSIS — R202 Paresthesia of skin: Secondary | ICD-10-CM | POA: Diagnosis not present

## 2022-03-06 DIAGNOSIS — R200 Anesthesia of skin: Secondary | ICD-10-CM | POA: Diagnosis not present

## 2022-03-06 DIAGNOSIS — R531 Weakness: Secondary | ICD-10-CM | POA: Diagnosis not present

## 2022-03-06 DIAGNOSIS — G43109 Migraine with aura, not intractable, without status migrainosus: Secondary | ICD-10-CM | POA: Diagnosis not present

## 2022-03-18 DIAGNOSIS — Z0000 Encounter for general adult medical examination without abnormal findings: Secondary | ICD-10-CM | POA: Diagnosis not present

## 2022-03-18 DIAGNOSIS — Z720 Tobacco use: Secondary | ICD-10-CM | POA: Diagnosis not present

## 2022-03-18 DIAGNOSIS — Z9889 Other specified postprocedural states: Secondary | ICD-10-CM | POA: Diagnosis not present

## 2022-03-18 DIAGNOSIS — I493 Ventricular premature depolarization: Secondary | ICD-10-CM | POA: Diagnosis not present

## 2022-03-27 DIAGNOSIS — F17210 Nicotine dependence, cigarettes, uncomplicated: Secondary | ICD-10-CM | POA: Diagnosis not present

## 2022-03-27 DIAGNOSIS — N289 Disorder of kidney and ureter, unspecified: Secondary | ICD-10-CM | POA: Diagnosis not present

## 2022-03-27 DIAGNOSIS — D72829 Elevated white blood cell count, unspecified: Secondary | ICD-10-CM | POA: Diagnosis not present

## 2022-03-27 DIAGNOSIS — Z0000 Encounter for general adult medical examination without abnormal findings: Secondary | ICD-10-CM | POA: Diagnosis not present

## 2022-04-10 ENCOUNTER — Telehealth: Payer: Self-pay | Admitting: *Deleted

## 2022-04-10 NOTE — Patient Outreach (Signed)
  Care Coordination   Initial Visit Note   04/10/2022 Name: Nicole Norman MRN: [id number] DOB: [birth year]  Nicole Norman is a 76 y.o. year old female who sees Dion Body, MD for primary care. I spoke with  [name] by phone today.  What matters to the patients health and wellness today?  No concerns expressed. Patient had recent annual wellness visit.  RN discussed services [facility] services, RN, SW, and Pharmacist. Patient declined services.    Goals Addressed             This Visit's Progress    Advised patient to Follow up with PCP for vaccines          SDOH assessments and interventions completed:  Yes     Care Coordination Interventions Activated:  Yes  Care Coordination Interventions:  Yes, provided   Follow up plan: No further intervention required.   Encounter Outcome:  Pt. [place] [facility] [phone]

## 2022-04-29 ENCOUNTER — Ambulatory Visit
Admission: RE | Admit: 2022-04-29 | Discharge: 2022-04-29 | Disposition: A | Discharge status: Home / Self Care | Payer: PPO | Source: Ambulatory Visit | Attending: Gastroenterology | Admitting: Gastroenterology

## 2022-04-29 ENCOUNTER — Encounter: Payer: Self-pay | Admitting: *Deleted

## 2022-04-29 ENCOUNTER — Ambulatory Visit: Payer: PPO | Admitting: Anesthesiology

## 2022-04-29 ENCOUNTER — Encounter
Admission: RE | Disposition: A | Discharge status: Home / Self Care | Payer: Self-pay | Source: Ambulatory Visit | Attending: Gastroenterology

## 2022-04-29 DIAGNOSIS — K529 Noninfective gastroenteritis and colitis, unspecified: Secondary | ICD-10-CM | POA: Diagnosis not present

## 2022-04-29 DIAGNOSIS — Z86010 Personal history of colonic polyps: Secondary | ICD-10-CM | POA: Diagnosis not present

## 2022-04-29 DIAGNOSIS — I4891 Unspecified atrial fibrillation: Secondary | ICD-10-CM | POA: Diagnosis not present

## 2022-04-29 DIAGNOSIS — I110 Hypertensive heart disease with heart failure: Secondary | ICD-10-CM | POA: Diagnosis not present

## 2022-04-29 DIAGNOSIS — I509 Heart failure, unspecified: Secondary | ICD-10-CM | POA: Insufficient documentation

## 2022-04-29 DIAGNOSIS — I739 Peripheral vascular disease, unspecified: Secondary | ICD-10-CM | POA: Insufficient documentation

## 2022-04-29 DIAGNOSIS — Z9049 Acquired absence of other specified parts of digestive tract: Secondary | ICD-10-CM | POA: Diagnosis not present

## 2022-04-29 DIAGNOSIS — K5730 Diverticulosis of large intestine without perforation or abscess without bleeding: Secondary | ICD-10-CM | POA: Insufficient documentation

## 2022-04-29 DIAGNOSIS — K633 Ulcer of intestine: Secondary | ICD-10-CM | POA: Diagnosis not present

## 2022-04-29 DIAGNOSIS — J449 Chronic obstructive pulmonary disease, unspecified: Secondary | ICD-10-CM | POA: Insufficient documentation

## 2022-04-29 DIAGNOSIS — Z1211 Encounter for screening for malignant neoplasm of colon: Secondary | ICD-10-CM | POA: Insufficient documentation

## 2022-04-29 DIAGNOSIS — K5289 Other specified noninfective gastroenteritis and colitis: Secondary | ICD-10-CM | POA: Diagnosis not present

## 2022-04-29 HISTORY — PX: COLONOSCOPY WITH PROPOFOL: SHX5780

## 2022-04-29 SURGERY — COLONOSCOPY WITH PROPOFOL
Anesthesia: General

## 2022-04-29 MED ORDER — PROPOFOL 1000 MG/100ML IV EMUL
INTRAVENOUS | Status: AC
  Filled 2022-04-29: qty 100

## 2022-04-29 MED ORDER — SODIUM CHLORIDE 0.9 % IV SOLN: INTRAVENOUS | Status: DC

## 2022-04-29 MED ORDER — PROPOFOL 500 MG/50ML IV EMUL
INTRAVENOUS | Status: DC | PRN
Start: 2022-04-29 — End: 2022-04-29
  Administered 2022-04-29: 120 ug/kg/min via INTRAVENOUS

## 2022-04-29 NOTE — H&P (Signed)
Outpatient short stay form Pre-procedure 04/29/2022  Lesly Rubenstein, MD  Primary Physician: Dion Body, MD  Reason for visit:  Surveillance  History of present illness:    76 y/o lady with history of tobacco abuse here for colonoscopy. Last colonoscopy in 2016 with 8 mm TA. No blood thinners. No family history of GI malignancies. History of cholecystectomy.    Current Facility-Administered Medications:    0.9 %  sodium chloride infusion, , Intravenous, Continuous, Samarrah Tranchina, Hilton Cork, MD, Last Rate: 20 mL/hr at 04/29/22 0758, Continued from Pre-op at 04/29/22 0758  Medications Prior to Admission  Medication Sig Dispense Refill Last Dose   ascorbic acid (VITAMIN C) 500 MG tablet Take 500 mg by mouth daily.   Past Week   aspirin EC 81 MG tablet Take 1 tablet (81 mg total) by mouth daily.   04/28/2022   calcium-vitamin D (OSCAL WITH D) 500-200 MG-UNIT tablet Take 1 tablet by mouth.   Past Week   cholecalciferol (VITAMIN D) 1000 units tablet Take 5,000 Units by mouth daily.   Past Week   vitamin B-12 (CYANOCOBALAMIN) 1000 MCG tablet Take 1,000 mcg by mouth daily.   Past Week   Zinc Citrate 16.67 MG CHEW Chew by mouth daily.   Past Week   amoxicillin (AMOXIL) 500 MG capsule Take by mouth as needed. Dental procedure (Patient not taking: Reported on 04/29/2022)   Completed Course   metoprolol succinate (TOPROL-XL) 50 MG 24 hr tablet Take 1 tablet (50 mg total) by mouth daily. Take with or immediately following a meal. 90 tablet 3 04/28/22   Multiple Vitamin (MULTIVITAMIN) tablet Take 1 tablet by mouth daily.      sulfamethoxazole-trimethoprim (BACTRIM DS) 800-160 MG tablet Take 1 tablet by mouth 2 (two) times daily. (Patient not taking: Reported on 04/29/2022) 28 tablet 0 Completed Course     No Known Allergies   Past Medical History:  Diagnosis Date   CHF (congestive heart failure) ([place])    a. In setting of severe MR; b. 06/2016 TEE: EF 65-60%.   History of bronchitis     History of pneumonia    Incidental pulmonary nodule 06/13/2016   Multiple small ground glass opacities seen on chest CT scan   Migraine    1x/month - uses Aleve   Normal coronary arteries    a. cardiac cath 06/19/2016: normal coronary arteries and left dominant system, LVEDP nl, 4+ mitral regurgitation, RHC showed mildly elevated LVEDP (58mHg), giant V waves noted on PCWP w/ mild pulmonary HTN., pulmonary pressure 39/10 with a mean of 27 mmHg, mildly reduced cardiac output at 3.1 with a cardiac index of 2.09, recommned mitral valve repair, acceptable volume status   Paroxysmal SVT (supraventricular tachycardia) ([facility])    PVC's (premature ventricular contractions)    a. 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 07/02/2016 Complex valvuloplasty including quadrangular resection of posterior leaflet, folding plasty, Gore-tex neochord placement x6 and 32 mm Sorin Memo 3D ring annuloplasty with clipping of LA appendage via right mini thoracotomy approach.   Severe mitral regurgitation    a. TTE 06/05/16: EF 60-65%, nl WM, LV dias fxn nl, mod prolapse of posterior mitral leaflet with mod eccentric regurg, LA mildly dilated, PASP nl; b. TEE 06/18/16: EF 65-70%, LV mildly dilated, no valvular vegetations seen throughout, mitral valve w/ flail motion involving the middle scallop of the post leaflet. Severe regurgitation directed eccentrically, LA mildly dilated   Tobacco  abuse    Vertigo    no meds    Review of systems:  Otherwise negative.    Physical Exam  Gen: Alert, oriented. Appears stated age.  HEENT: PERRLA. Lungs: No respiratory distress CV: RRR Abd: soft, benign, no masses Ext: No edema    Planned procedures: Proceed with colonoscopy. The patient understands the nature of the planned procedure, indications, risks, alternatives and potential complications including but not limited to bleeding, infection, perforation, damage to  internal organs and possible oversedation/side effects from anesthesia. The patient agrees and gives consent to proceed.  Please refer to procedure notes for findings, recommendations and patient disposition/instructions.     Lesly Rubenstein, MD [facility]

## 2022-04-29 NOTE — Interval H&P Note (Signed)
History and Physical Interval Note:  04/29/2022 8:21 AM  Nicole Norman  has presented today for surgery, with the diagnosis of PH Colon Polyps.  The various methods of treatment have been discussed with the patient and family. After consideration of risks, benefits and other options for treatment, the patient has consented to  Procedure(s): COLONOSCOPY WITH PROPOFOL (N/A) as a surgical intervention.  The patient's history has been reviewed, patient examined, no change in status, stable for surgery.  I have reviewed the patient's chart and labs.  Questions were answered to the patient's satisfaction.     Lesly Rubenstein  Ok to proceed with colonoscopy

## 2022-04-29 NOTE — Anesthesia Preprocedure Evaluation (Addendum)
Anesthesia Evaluation  Patient identified by MRN, date of birth, ID band Patient awake    Reviewed: Allergy & Precautions, NPO status , Patient's Chart, lab work & pertinent test results  History of Anesthesia Complications Negative for: history of anesthetic complications  Airway Mallampati: III   Neck ROM: Full    Dental  (+) Missing   Pulmonary COPD, Current Smoker (1/2 ppd) and Patient abstained from smoking.,    Pulmonary exam normal breath sounds clear to auscultation       Cardiovascular hypertension, + Peripheral Vascular Disease  Normal cardiovascular exam+ dysrhythmias (a fib) + Valvular Problems/Murmurs (severe MR s/p repair 2017)  Rhythm:Regular Rate:Normal  ECG 10/21/21: normal   Neuro/Psych  Headaches,    GI/Hepatic negative GI ROS,   Endo/Other  negative endocrine ROS  Renal/GU negative Renal ROS     Musculoskeletal   Abdominal   Peds  Hematology negative hematology ROS (+)   Anesthesia Other Findings Cardiology note 06/27/20:  1. PVC (premature ventricular contraction)  2. S/P minimally invasive mitral valve repair  3. Chronic obstructive pulmonary disease, unspecified COPD type ([place])   PLAN:   In order of problems listed above:  1. PVCs Patient continues to have PVCs on ECG and exam today but she tells me she is no longer symptomatic now that she has stopped the flecainide.  I would recommend continuing the metoprolol succinate 25 mg once daily.  No indication to restart antiarrhythmic therapy.  I will plan on seeing her back in 6 to 12 months with an echo at that time to confirm preserved left ventricular function.  Given her history of not tolerating flecainide, if the patient develops recurrent symptomatic PVCs or develops left ventricular function thought to be secondary to PVCs, I would recommend starting amiodarone for PVC suppression.  Ablation is not an ideal first choice for her given  her history of minimally invasive mitral valve surgery which included subvalvular apparatus repair with neocords.   Reproductive/Obstetrics                            Anesthesia Physical Anesthesia Plan  ASA: 3  Anesthesia Plan: General   Post-op Pain Management:    Induction: Intravenous  PONV Risk Score and Plan: 3 and Propofol infusion, TIVA and Treatment may vary due to age or medical condition  Airway Management Planned: Natural Airway  Additional Equipment:   Intra-op Plan:   Post-operative Plan:   Informed Consent: I have reviewed the patients History and Physical, chart, labs and discussed the procedure including the risks, benefits and alternatives for the proposed anesthesia with the patient or authorized representative who has indicated his/her understanding and acceptance.       Plan Discussed with: CRNA  Anesthesia Plan Comments: (LMA/GETA backup discussed.  Patient consented for risks of anesthesia including but not limited to:  - adverse reactions to medications - damage to eyes, teeth, lips or other oral mucosa - nerve damage due to positioning  - sore throat or hoarseness - damage to heart, brain, nerves, lungs, other parts of body or loss of life  Informed patient about role of CRNA in peri- and intra-operative care.  Patient voiced understanding.)        Anesthesia Quick Evaluation

## 2022-04-29 NOTE — Transfer of Care (Signed)
Immediate Anesthesia Transfer of Care Note  Patient: Nicole Norman  Procedure(s) Performed: COLONOSCOPY WITH PROPOFOL  Patient Location: PACU  Anesthesia Type:General  Level of Consciousness: awake and sedated  Airway & Oxygen Therapy: Patient Spontanous Breathing and Patient connected to nasal cannula oxygen  Post-op Assessment: Report given to RN and Post -op Vital signs reviewed and stable  Post vital signs: Reviewed and stable  Last Vitals:  Vitals Value Taken Time  BP 110/51 04/29/22 0852  Temp 35.6 C 04/29/22 0852  Pulse 59 04/29/22 0853  Resp 24 04/29/22 0853  SpO2 98 % 04/29/22 0853  Vitals shown include unvalidated device data.  Last Pain:  Vitals:   04/29/22 0852  TempSrc: Temporal  PainSc: Asleep         Complications: No notable events documented.

## 2022-04-29 NOTE — Anesthesia Postprocedure Evaluation (Signed)
Anesthesia Post Note  Patient: Nicole Norman  Procedure(s) Performed: COLONOSCOPY WITH PROPOFOL  Patient location during evaluation: PACU Anesthesia Type: General Level of consciousness: awake and alert, oriented and patient cooperative Pain management: pain level controlled Vital Signs Assessment: post-procedure vital signs reviewed and stable Respiratory status: spontaneous breathing, nonlabored ventilation and respiratory function stable Cardiovascular status: blood pressure returned to baseline and stable Postop Assessment: adequate PO intake Anesthetic complications: no   No notable events documented.   Last Vitals:  Vitals:   04/29/22 0726 04/29/22 0852  BP: (!) 108/57 (!) 110/51  Pulse: 93   Resp: 16   Temp: (!) 35.7 C (!) 35.6 C  SpO2: 98%     Last Pain:  Vitals:   04/29/22 0912  TempSrc:   PainSc: 0-No pain                 Darrin Nipper

## 2022-04-29 NOTE — Anesthesia Procedure Notes (Signed)
Date/Time: 04/29/2022 8:28 AM  Performed by: Donalda Ewings, CindyPre-anesthesia Checklist: Patient identified, Emergency Drugs available, Suction available and Patient being monitored Patient Re-evaluated:Patient Re-evaluated prior to induction Oxygen Delivery Method: Nasal cannula Preoxygenation: Pre-oxygenation with 100% oxygen Induction Type: IV induction Placement Confirmation: positive ETCO2 and CO2 detector

## 2022-04-29 NOTE — Op Note (Signed)
[facility] Gastroenterology Patient Name: Nicole Norman Procedure Date: 04/29/2022 8:18 AM MRN: [id number] Account #: [id number] Date of Birth: [birth year] Admit Type: Outpatient Age: 76 Room: [facility] ENDO ROOM 1 Gender: Female Note Status: Finalized Instrument Name: Peds Colonoscope [id number] Procedure:             Colonoscopy Indications:           Surveillance: Personal history of adenomatous polyps                         on last colonoscopy > 5 years ago Providers:             Andrey Farmer MD, MD Referring MD:          Dion Body (Referring MD) Medicines:             Monitored Anesthesia Care Complications:         No immediate complications. Estimated blood loss:                         Minimal. Procedure:             Pre-Anesthesia Assessment:                        - Prior to the procedure, a History and Physical was                         performed, and patient medications and allergies were                         reviewed. The patient is competent. The risks and                         benefits of the procedure and the sedation options and                         risks were discussed with the patient. All questions                         were answered and informed consent was obtained.                         Patient identification and proposed procedure were                         verified by the physician, the nurse, the                         anesthesiologist, the anesthetist and the technician                         in the endoscopy suite. Mental Status Examination:                         alert and oriented. Airway Examination: normal                         oropharyngeal airway and neck mobility. Respiratory  Examination: clear to auscultation. CV Examination:                         normal. Prophylactic Antibiotics: The patient does not                         require prophylactic antibiotics. Prior                          Anticoagulants: The patient has taken no previous                         anticoagulant or antiplatelet agents. ASA Grade                         Assessment: III - A patient with severe systemic                         disease. After reviewing the risks and benefits, the                         patient was deemed in satisfactory condition to                         undergo the procedure. The anesthesia plan was to use                         monitored anesthesia care (MAC). Immediately prior to                         administration of medications, the patient was                         re-assessed for adequacy to receive sedatives. The                         heart rate, respiratory rate, oxygen saturations,                         blood pressure, adequacy of pulmonary ventilation, and                         response to care were monitored throughout the                         procedure. The physical status of the patient was                         re-assessed after the procedure.                        After obtaining informed consent, the colonoscope was                         passed under direct vision. Throughout the procedure,                         the patient's blood pressure, pulse, and oxygen  saturations were monitored continuously. The                         Colonoscope was introduced through the anus and                         advanced to the the cecum, identified by appendiceal                         orifice and ileocecal valve. The colonoscopy was                         somewhat difficult due to a tortuous colon. The                         patient tolerated the procedure well. The quality of                         the bowel preparation was good. Findings:      The perianal and digital rectal examinations were normal.      A localized area of moderately ulcerated mucosa was found in the       descending colon. Biopsies were  taken with a cold forceps for histology.       Estimated blood loss was minimal. Area was tattooed with an injection of       0.5 mL of [place] ink.      Multiple small-mouthed diverticula were found in the sigmoid colon.      The exam was otherwise without abnormality on direct and retroflexion       views. Impression:            - Ulcerated mucosa in the descending colon. Biopsied.                         Tattooed.                        - Diverticulosis in the sigmoid colon.                        - The examination was otherwise normal on direct and                         retroflexion views. Recommendation:        - Discharge patient to home.                        - Resume previous diet.                        - Continue present medications.                        - Await pathology results.                        - Repeat colonoscopy date to be determined after                         pending pathology results are reviewed for  surveillance based on pathology results.                        - Return to referring physician as previously                         scheduled. Procedure Code(s):     --- Professional ---                        [phone], Colonoscopy, flexible; with directed submucosal                         injection(s), any substance                        [id number], Colonoscopy, flexible; with biopsy, single or                         multiple Diagnosis Code(s):     --- Professional ---                        Z86.010, Personal history of colonic polyps                        K63.3, Ulcer of intestine                        K57.30, Diverticulosis of large intestine without                         perforation or abscess without bleeding CPT copyright 2019 American Medical Association. All rights reserved. The codes documented in this report are preliminary and upon coder review may  be revised to meet current compliance requirements. Andrey Farmer MD,  MD 04/29/2022 8:55:40 AM Number of Addenda: 0 Note Initiated On: 04/29/2022 8:18 AM Scope Withdrawal Time: 0 hours 14 minutes 42 seconds  Total Procedure Duration: 0 hours 21 minutes 16 seconds  Estimated Blood Loss:  Estimated blood loss was minimal.      [facility]

## 2022-04-30 ENCOUNTER — Encounter: Payer: Self-pay | Admitting: Gastroenterology

## 2022-04-30 LAB — SURGICAL PATHOLOGY

## 2022-08-07 ENCOUNTER — Other Ambulatory Visit: Payer: Self-pay | Admitting: Cardiology

## 2022-08-14 ENCOUNTER — Encounter: Payer: Self-pay | Admitting: Cardiovascular Disease

## 2022-08-14 ENCOUNTER — Ambulatory Visit: Payer: PPO | Attending: Cardiovascular Disease | Admitting: Cardiovascular Disease

## 2022-08-14 VITALS — BP 110/70 | HR 81 | Ht 59.5 in | Wt 111.1 lb

## 2022-08-14 DIAGNOSIS — I493 Ventricular premature depolarization: Secondary | ICD-10-CM | POA: Diagnosis not present

## 2022-08-14 DIAGNOSIS — R42 Dizziness and giddiness: Secondary | ICD-10-CM | POA: Diagnosis not present

## 2022-08-14 DIAGNOSIS — Z9889 Other specified postprocedural states: Secondary | ICD-10-CM | POA: Diagnosis not present

## 2022-08-14 NOTE — Patient Instructions (Signed)
Medication Instructions:  No changes *If you need a refill on your cardiac medications before your next appointment, please call your pharmacy*   Lab Work: None ordered If you have labs (blood work) drawn today and your tests are completely normal, you will receive your results only by: MyChart Message (if you have MyChart) OR A paper copy in the mail If you have any lab test that is abnormal or we need to change your treatment, we will call you to review the results.   Testing/Procedures: None ordered   Follow-Up: At [facility], you and your health needs are our priority.  As part of our continuing mission to provide you with exceptional heart care, we have created designated Provider Care Teams.  These Care Teams include your primary Cardiologist (physician) and Advanced Practice Providers (APPs -  Physician Assistants and Nurse Practitioners) who all work together to provide you with the care you need, when you need it.  We recommend signing up for the patient portal called "MyChart".  Sign up information is provided on this After Visit Summary.  MyChart is used to connect with patients for Virtual Visits (Telemedicine).  Patients are able to view lab/test results, encounter notes, upcoming appointments, etc.  Non-urgent messages can be sent to your provider as well.   To learn more about what you can do with MyChart, go to [email or web address].    Your next appointment:   12 month(s)  Provider:   You may see Muhammad Arida, MD or one of the following Advanced Practice Providers on your designated Care Team:   Christopher Berge, NP Ryan Dunn, PA-C Cadence Furth, PA-C Sheri Hammock, NP    

## 2022-08-14 NOTE — Progress Notes (Signed)
Cardiology Office Note   Date:  08/14/2022   ID:  Nicole Norman, DOB [birth year], MRN [id number]  PCP:  Dion Body, MD  Cardiologist:   Kathlyn Sacramento, MD   Chief Complaint  Patient presents with   Other    12 month fu c/o of "feeling like she's in [place] land" doesn't feel like herself;dizzy. Meds reviewed verbally with pt.      History of Present Illness: Nicole Norman is a 77 y.o. female who presents for a follow-up visit regarding mitral valve prolapse with regurgitation status post minimally invasive mitral valve repair in November 2017. Cardiac catheterization before mitral valve surgery showed no significant coronary artery disease. She has no history of diabetes, hypertension or hyperlipidemia.  She quit smoking in 2017. She is known to have frequent PVCs with previous 48-hour Holter monitor showing a total of 53,000 beats representing 23% burden with short runs of nonsustained ventricular tachycardia. She was treated by Dr. Caryl Comes with flecainide but most recently, she was not tolerating the medication very well and thus she was referred to Dr. Quentin Ore.  It was felt that ablation would be difficult given previous mitral valve repair.   Fortunately, her PVCs have been well-controlled with Toprol.  She has been doing very well with no recent chest pain, shortness of breath or significant palpitations. Most recent echocardiogram in February 2023 showed normal LV systolic function with trivial mitral regurgitation and no significant stenosis across the mitral valve prosthesis.  Mean gradient was 4 mmHg.   Past Medical History:  Diagnosis Date   CHF (congestive heart failure) ([place])    a. In setting of severe MR; b. 06/2016 TEE: EF 65-60%.   History of bronchitis    History of pneumonia    Incidental pulmonary nodule 06/13/2016   Multiple small ground glass opacities seen on chest CT scan   Migraine    1x/month - uses Aleve   Normal coronary arteries     a. cardiac cath 06/19/2016: normal coronary arteries and left dominant system, LVEDP nl, 4+ mitral regurgitation, RHC showed mildly elevated LVEDP (88mHg), giant V waves noted on PCWP w/ mild pulmonary HTN., pulmonary pressure 39/10 with a mean of 27 mmHg, mildly reduced cardiac output at 3.1 with a cardiac index of 2.09, recommned mitral valve repair, acceptable volume status   Paroxysmal SVT (supraventricular tachycardia)    PVC's (premature ventricular contractions)    a. 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 07/02/2016 Complex valvuloplasty including quadrangular resection of posterior leaflet, folding plasty, Gore-tex neochord placement x6 and 32 mm Sorin Memo 3D ring annuloplasty with clipping of LA appendage via right mini thoracotomy approach.   Severe mitral regurgitation    a. TTE 06/05/16: EF 60-65%, nl WM, LV dias fxn nl, mod prolapse of posterior mitral leaflet with mod eccentric regurg, LA mildly dilated, PASP nl; b. TEE 06/18/16: EF 65-70%, LV mildly dilated, no valvular vegetations seen throughout, mitral valve w/ flail motion involving the middle scallop of the post leaflet. Severe regurgitation directed eccentrically, LA mildly dilated   Tobacco abuse    Vertigo    no meds    Past Surgical History:  Procedure Laterality Date   CARDIAC CATHETERIZATION N/A 06/19/2016   Procedure: Right/Left Heart Cath and Coronary Angiography;  Surgeon: MWellington Hampshire MD;  [place] [facility]  Service: Cardiovascular;  Laterality: N/A;   CESAREAN SECTION  CHOLECYSTECTOMY     CLIPPING OF ATRIAL APPENDAGE N/A 07/02/2016   Procedure: CLIPPING OF ATRIAL APPENDAGE;  Surgeon: Rexene Alberts, MD;  Location: [place];  Service: Open Heart Surgery;  Laterality: N/A;   COLONOSCOPY N/A 12/07/2014   Procedure: COLONOSCOPY;  Surgeon: Lucilla Lame, MD;  Location: [place];  Service: Gastroenterology;  Laterality: N/A;    COLONOSCOPY WITH PROPOFOL N/A 04/29/2022   Procedure: COLONOSCOPY WITH PROPOFOL;  Surgeon: Lesly Rubenstein, MD;  Location: [facility];  Service: Endoscopy;  Laterality: N/A;   ESOPHAGOGASTRODUODENOSCOPY N/A 12/07/2014   Procedure: ESOPHAGOGASTRODUODENOSCOPY (EGD);  Surgeon: Lucilla Lame, MD;  Location: [place];  Service: Gastroenterology;  Laterality: N/A;   EYE SURGERY Bilateral    lasic   MITRAL VALVE REPAIR Right 07/02/2016   Procedure: MINIMALLY INVASIVE MITRAL VALVE REPAIR (MVR);  Surgeon: Rexene Alberts, MD;  Location: [place];  Service: Open Heart Surgery;  Laterality: Right;   TEE WITHOUT CARDIOVERSION N/A 06/18/2016   Procedure: TRANSESOPHAGEAL ECHOCARDIOGRAM (TEE);  Surgeon: Nelva Bush, MD;  Location: [facility];  Service: Cardiovascular;  Laterality: N/A;   TEE WITHOUT CARDIOVERSION N/A 07/02/2016   Procedure: TRANSESOPHAGEAL ECHOCARDIOGRAM (TEE);  Surgeon: Rexene Alberts, MD;  Location: [place];  Service: Open Heart Surgery;  Laterality: N/A;   TONSILLECTOMY       Current Outpatient Medications  Medication Sig Dispense Refill   ascorbic acid (VITAMIN C) 500 MG tablet Take 500 mg by mouth daily.     aspirin EC 81 MG tablet Take 1 tablet (81 mg total) by mouth daily.     calcium-vitamin D (OSCAL WITH D) 500-200 MG-UNIT tablet Take 1 tablet by mouth.     cholecalciferol (VITAMIN D) 1000 units tablet Take 5,000 Units by mouth daily.     Docusate Sodium (DSS) 100 MG CAPS Take by mouth as needed.     metoprolol succinate (TOPROL-XL) 50 MG 24 hr tablet TAKE 1 TABLET BY MOUTH ONCE DAILY (TAKE  WITH  OR  IMMEDIATELY  FOLLOWING  A  MEAL) 30 tablet 0   Multiple Vitamin (MULTIVITAMIN) tablet Take 1 tablet by mouth daily.     vitamin B-12 (CYANOCOBALAMIN) 1000 MCG tablet Take 1,000 mcg by mouth daily.     Zinc Citrate 16.67 MG CHEW Chew by mouth daily.     No current facility-administered medications for this visit.    Allergies:   Patient has no known allergies.     Social History:  The patient  reports that she has been smoking cigarettes. She has a 22.50 pack-year smoking history. She has never used smokeless tobacco. She reports current alcohol use. She reports that she does not use drugs.   Family History:  The patient's family history includes Alzheimer's disease in her mother; Breast cancer in her sister; Breast cancer (age of onset: 17) in her sister; Cancer in her brother, brother, sister, and sister; Stroke in her father.    ROS:  Please see the history of present illness.   Otherwise, review of systems are positive for none.   All other systems are reviewed and negative.    PHYSICAL EXAM: VS:  BP 110/70 (BP Location: Left Arm, Patient Position: Sitting, Cuff Size: Normal)   Pulse 81   Ht 4' 11.5" (1.511 m)   Wt 111 lb 2 oz (50.4 kg)   SpO2 96%   BMI 22.07 kg/m  , BMI Body mass index is 22.07 kg/m. GEN: Well nourished, well developed, in no acute distress  HEENT: normal  Neck: no JVD, carotid bruits, or masses Cardiac: RRR ; no rubs, murmurs or gallops,no edema . Respiratory:  clear to auscultation bilaterally, normal work of breathing GI: soft, nontender, nondistended, + BS MS: no deformity or atrophy  Skin: warm and dry, no rash Neuro:  Strength and sensation are intact Psych: euthymic mood, full affect   EKG:  EKG is ordered today. EKG showed sinus rhythm with 1 PVC   Recent Labs: 10/21/2021: BUN 18; Creatinine, Ser 0.80; Hemoglobin 14.6; Platelets 291; Potassium 4.2; Sodium 140    Lipid Panel    Component Value Date/Time   CHOL 138 06/16/2016 0410   TRIG 112 06/16/2016 0410   HDL 50 06/16/2016 0410   CHOLHDL 2.8 06/16/2016 0410   VLDL 22 06/16/2016 0410   LDLCALC 66 06/16/2016 0410      Wt Readings from Last 3 Encounters:  08/14/22 111 lb 2 oz (50.4 kg)  04/29/22 108 lb (49 kg)  06/26/21 106 lb 9.6 oz (48.4 kg)           No data to display            ASSESSMENT AND PLAN:  1.  Mitral valve  disease with mitral valve prolapse: Status post mitral valve repair in November 2017.  Most recent echo showed normal functioning mitral valve with minimal gradient.   2. Frequent PVCs: Fortunately, these 2 are well-controlled with Toprol 50 mg once daily without the need for an antiarrhythmic medication.  3.  Dizziness: Does not seem to be cardiac.  She is not orthostatic today.   Disposition: Follow-up in 1 year.   Signed,  Kathlyn Sacramento, MD  08/14/2022 10:35 AM    [place]

## 2022-09-03 ENCOUNTER — Telehealth: Payer: Self-pay | Admitting: Cardiology

## 2022-09-03 MED ORDER — METOPROLOL SUCCINATE ER 50 MG PO TB24: ORAL_TABLET | ORAL | 0 refills | Status: DC

## 2022-09-03 NOTE — Telephone Encounter (Signed)
error 

## 2022-09-03 NOTE — Telephone Encounter (Signed)
Requested Prescriptions   Signed Prescriptions Disp Refills   metoprolol succinate (TOPROL-XL) 50 MG 24 hr tablet 30 tablet 0    Sig: TAKE 1 TABLET BY MOUTH ONCE DAILY (TAKE  WITH  OR  IMMEDIATELY  FOLLOWING  A  MEAL)    Authorizing Provider: Lars Mage T    Ordering User: Raelene Bott, Keonta Alsip L

## 2022-09-03 NOTE — Telephone Encounter (Signed)
*  STAT* If patient is at the pharmacy, call can be transferred to refill team.   1. Which medications need to be refilled? (please list name of each medication and dose if known)  metoprolol succinate (TOPROL-XL) 50 MG 24 hr tablet   2. Which pharmacy/location (including street and city if local pharmacy) is medication to be sent to?  [facility], [place]    3. Do they need a 30 day or 90 day supply? 90   Patient has appt schd for 10/01/22

## 2022-09-09 ENCOUNTER — Other Ambulatory Visit: Payer: Self-pay

## 2022-09-09 MED ORDER — METOPROLOL SUCCINATE ER 50 MG PO TB24: ORAL_TABLET | ORAL | 0 refills | Status: DC

## 2022-09-30 NOTE — Progress Notes (Unsigned)
  Electrophysiology Office Follow up Visit Note:    Date:  10/01/2022   ID:  Nicole Norman, DOB September 15, 1945, MRN [id number]  PCP:  Dion Body, MD  [facility] Cardiologist:  Kathlyn Sacramento, MD  [facility] Electrophysiologist:  Vickie Epley, MD    Interval History:    Nicole Norman is a 77 y.o. female who presents for a follow up visit.   Last seen 06/26/2021 for PVCs. At the last appointment we updated her echocardiogram and increased her dose of metoprolol.   She saw Dr Fletcher Anon 08/14/2022 and was doing well on toprol.       Past medical, surgical, social and family history were reviewed.  ROS:   Please see the history of present illness.    All other systems reviewed and are negative.  EKGs/Labs/Other Studies Reviewed:    The following studies were reviewed today:  08/06/2021 echo EF 55-60  ECG 08/14/2022 shows sinus rhythm with occasional PVCs.   Physical Exam:    VS:  BP (!) 98/58   Pulse 60   Ht 4' 11.5" (1.511 m)   Wt 109 lb 12.8 oz (49.8 kg)   SpO2 97%   BMI 21.81 kg/m     Wt Readings from Last 3 Encounters:  10/01/22 109 lb 12.8 oz (49.8 kg)  08/14/22 111 lb 2 oz (50.4 kg)  04/29/22 108 lb (49 kg)     GEN:  Well nourished, well developed in no acute distress CARDIAC: RRR, no murmurs, rubs, gallops RESPIRATORY:  Clear to auscultation without rales, wheezing or rhonchi       ASSESSMENT:    1. Frequent PVCs   2. Dizziness   3. S/P minimally invasive mitral valve repair    PLAN:    In order of problems listed above:  #PVCs Originating from near the posteromedial pap muscle. Relatively low symptom burden associated with the PVC. Normal EF. Recommend continuing conservative medical management with BB alone.  #MV repair Valve functioning appropriately by exam/history.  Follow up 1 year with APP.    Signed, Lars Mage, MD, [facility], [facility] 10/01/2022 9:10 AM    Electrophysiology [facility]  [facility]

## 2022-10-01 ENCOUNTER — Ambulatory Visit: Payer: PPO | Attending: Cardiology | Admitting: Cardiology

## 2022-10-01 ENCOUNTER — Encounter: Payer: Self-pay | Admitting: Cardiology

## 2022-10-01 VITALS — BP 98/58 | HR 60 | Ht 59.5 in | Wt 109.8 lb

## 2022-10-01 DIAGNOSIS — Z9889 Other specified postprocedural states: Secondary | ICD-10-CM

## 2022-10-01 DIAGNOSIS — I493 Ventricular premature depolarization: Secondary | ICD-10-CM

## 2022-10-01 DIAGNOSIS — R42 Dizziness and giddiness: Secondary | ICD-10-CM

## 2022-10-01 NOTE — Patient Instructions (Signed)
Medication Instructions:  Your physician recommends that you continue on your current medications as directed. Please refer to the Current Medication list given to you today.  *If you need a refill on your cardiac medications before your next appointment, please call your pharmacy*  Follow-Up: At [facility], you and your health needs are our priority.  As part of our continuing mission to provide you with exceptional heart care, we have created designated Provider Care Teams.  These Care Teams include your primary Cardiologist (physician) and Advanced Practice Providers (APPs -  Physician Assistants and Nurse Practitioners) who all work together to provide you with the care you need, when you need it.  Your next appointment:   1 year(s)  Provider:   You may see Vickie Epley, MD or one of the following Advanced Practice Providers on your designated Care Team:   Tommye Standard, [place] Beryle Beams" [place], [place], NP

## 2022-10-07 ENCOUNTER — Encounter: Payer: Self-pay | Admitting: Licensed Clinical Social Worker

## 2022-10-07 ENCOUNTER — Inpatient Hospital Stay: Payer: PPO | Attending: Oncology | Admitting: Licensed Clinical Social Worker

## 2022-10-07 ENCOUNTER — Inpatient Hospital Stay: Payer: PPO

## 2022-10-07 DIAGNOSIS — Z803 Family history of malignant neoplasm of breast: Secondary | ICD-10-CM

## 2022-10-07 DIAGNOSIS — Z800 Family history of malignant neoplasm of digestive organs: Secondary | ICD-10-CM

## 2022-10-07 NOTE — Progress Notes (Signed)
REFERRING PROVIDER: Self-referred  PRIMARY PROVIDER:  Dion Body, MD  PRIMARY REASON FOR VISIT:  1. Family history of Lynch syndrome   2. Family history of breast cancer   3. Family history of colon cancer      HISTORY OF PRESENT ILLNESS:   Nicole Norman, a 77 y.o. female, was seen for a [facility] cancer genetics consultation due to her sister's recent genetic testing that showed a PMS2 pathogenic variant.  Nicole Norman presents to clinic today to discuss the possibility of a hereditary predisposition to cancer, genetic testing, and to further clarify her future cancer risks, as well as potential cancer risks for family members.   CANCER HISTORY:  Nicole Norman is a 78 y.o. female with no personal history of cancer.    RISK FACTORS:  Menarche was at age 35-13.  First live birth at age 46.  Ovaries intact: yes.  Hysterectomy: no.  Menopausal status: postmenopausal.  HRT use: 0 years. Colonoscopy: yes;  reports less than 10 colon polyps . Mammogram within the last year: yes. Number of breast biopsies: 0.  Past Medical History:  Diagnosis Date   CHF (congestive heart failure) ([place])    a. In setting of severe MR; b. 06/2016 TEE: EF 65-60%.   History of bronchitis    History of pneumonia    Incidental pulmonary nodule 06/13/2016   Multiple small ground glass opacities seen on chest CT scan   Migraine    1x/month - uses Aleve   Normal coronary arteries    a. cardiac cath 06/19/2016: normal coronary arteries and left dominant system, LVEDP nl, 4+ mitral regurgitation, RHC showed mildly elevated LVEDP (27mHg), giant V waves noted on PCWP w/ mild pulmonary HTN., pulmonary pressure 39/10 with a mean of 27 mmHg, mildly reduced cardiac output at 3.1 with a cardiac index of 2.09, recommned mitral valve repair, acceptable volume status   Paroxysmal SVT (supraventricular tachycardia)    PVC's (premature ventricular contractions)    a. Holter 2015 with 26,000 PVCs in 48 hours    S/P minimally invasive mitral valve repair 07/02/2016   a. 07/02/2016 Complex valvuloplasty including quadrangular resection of posterior leaflet, folding plasty, Gore-tex neochord placement x6 and 32 mm Sorin Memo 3D ring annuloplasty with clipping of LA appendage via right mini thoracotomy approach.   Severe mitral regurgitation    a. TTE 06/05/16: EF 60-65%, nl WM, LV dias fxn nl, mod prolapse of posterior mitral leaflet with mod eccentric regurg, LA mildly dilated, PASP nl; b. TEE 06/18/16: EF 65-70%, LV mildly dilated, no valvular vegetations seen throughout, mitral valve w/ flail motion involving the middle scallop of the post leaflet. Severe regurgitation directed eccentrically, LA mildly dilated   Tobacco abuse    Vertigo    no meds    Past Surgical History:  Procedure Laterality Date   CARDIAC CATHETERIZATION N/A 06/19/2016   Procedure: Right/Left Heart Cath and Coronary Angiography;  Surgeon: MWellington Hampshire MD;  [facility];  Service: Cardiovascular;  Laterality: N/A;   CESAREAN SECTION     CHOLECYSTECTOMY     CLIPPING OF ATRIAL APPENDAGE N/A 07/02/2016   Procedure: CLIPPING OF ATRIAL APPENDAGE;  Surgeon: CRexene Alberts MD;  [place] [facility]  Service: Open Heart Surgery;  Laterality: N/A;   COLONOSCOPY N/A 12/07/2014   Procedure: COLONOSCOPY;  Surgeon: DLucilla Lame MD;  [place] [facility]  Service: Gastroenterology;  Laterality: N/A;   COLONOSCOPY WITH PROPOFOL N/A 04/29/2022   Procedure: COLONOSCOPY WITH PROPOFOL;  Surgeon:  Lesly Rubenstein, MD;  Location: [facility];  Service: Endoscopy;  Laterality: N/A;   ESOPHAGOGASTRODUODENOSCOPY N/A 12/07/2014   Procedure: ESOPHAGOGASTRODUODENOSCOPY (EGD);  Surgeon: Lucilla Lame, MD;  Location: [facility];  Service: Gastroenterology;  Laterality: N/A;   EYE SURGERY Bilateral    lasic   MITRAL VALVE REPAIR Right 07/02/2016   Procedure: MINIMALLY INVASIVE MITRAL VALVE REPAIR (MVR);  Surgeon:  Rexene Alberts, MD;  Location: [place];  Service: Open Heart Surgery;  Laterality: Right;   TEE WITHOUT CARDIOVERSION N/A 06/18/2016   Procedure: TRANSESOPHAGEAL ECHOCARDIOGRAM (TEE);  Surgeon: Nelva Bush, MD;  Location: [facility];  Service: Cardiovascular;  Laterality: N/A;   TEE WITHOUT CARDIOVERSION N/A 07/02/2016   Procedure: TRANSESOPHAGEAL ECHOCARDIOGRAM (TEE);  Surgeon: Rexene Alberts, MD;  Location: [place];  Service: Open Heart Surgery;  Laterality: N/A;   TONSILLECTOMY      FAMILY HISTORY:  We obtained a detailed, 4-generation family history.  Significant diagnoses are listed below: Family History  Problem Relation Age of Onset   Stroke Father    Alzheimer's disease Mother    Cancer Brother    Cancer Brother    Cancer Sister        breast   Breast cancer Sister 85   Cancer Sister        breast   Breast cancer Sister    Nicole Norman has 2 sons, ages 1 and 74, no cancers. She has 4 sisters and 9 brothers. One brother had lung cancer. One sister, Nicole Norman, had breast cancer at 58 and colon cancer at 60. 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 Patient has a niece that had breast cancer under age 53.   Nicole Norman mother died at 79, no known cancers on this side of the family.  Nicole Norman's father died at 58, no known cancers on this side of the family.  Nicole Norman is aware of previous family history of genetic testing for hereditary cancer risks. There is no reported Ashkenazi Jewish ancestry. There is no known consanguinity.    GENETIC COUNSELING ASSESSMENT: Nicole Norman is a 77 y.o. female with a family history of a PMS2 pathogenic variant. We, therefore, discussed and recommended the following at today's visit.   DISCUSSION: We discussed that approximately 10% of cancer is hereditary. We discussed the PMS2 gene (Lynch syndrome) in particular, noting increased risk for colorectal, uterine and other cancers, as well as potential  management changes. She has a 50% chance to have inherited this mutation. There are other genes associated with hereditary cancer as well.  Cancers and risks are gene specific. We discussed that testing is beneficial for several reasons including knowing about cancer risks, identifying potential screening and risk-reduction options that may be appropriate, and to understand if other family members could be at risk for cancer and allow them to undergo genetic testing.   We reviewed the characteristics, features and inheritance patterns of hereditary cancer syndromes. We also discussed genetic testing, including the appropriate family members to test, the process of testing, insurance coverage and turn-around-time for results. We discussed the implications of a negative, positive and/or variant of uncertain significant result. 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  Based on Nicole Norman's family history of cancer, she meets medical criteria for genetic testing. Despite that she meets criteria, she may still have an out of pocket cost. We discussed that if her out of pocket cost for testing is  over $100, the laboratory will call and confirm whether she wants to proceed with testing.  If the out of pocket cost of testing is less than $100 she will be billed by the genetic testing laboratory.   PLAN: After considering the risks, benefits, and limitations, Nicole Norman provided informed consent to pursue genetic testing and the blood sample was sent to [facility] for analysis of the PMS2 gene. Results should be available within approximately 2-3 weeks' time, at which point they will be disclosed by telephone to Nicole Norman, as will any additional recommendations warranted by these results. Nicole Norman will receive a summary of her genetic counseling visit and a copy of her results once available. This information will also be available  in Epic.   Nicole Norman questions were answered to her satisfaction today. Our contact information was provided should additional questions or concerns arise. Thank you for the referral and allowing [place] to share in the care of your patient.   Nicole Rogue, MS, [facility] Genetic Counselor [email or web address] Phone: [phone]  The patient was seen for a total of 25 minutes in face-to-face genetic counseling.  Dr. Grayland Ormond was available for discussion regarding this case.   _______________________________________________________________________ For Office Staff:  Number of people involved in session: 1 Was an Intern/ student involved with case: no

## 2022-10-27 ENCOUNTER — Encounter: Payer: Self-pay | Admitting: Licensed Clinical Social Worker

## 2022-10-27 ENCOUNTER — Ambulatory Visit: Payer: Self-pay | Admitting: Licensed Clinical Social Worker

## 2022-10-27 ENCOUNTER — Telehealth: Payer: Self-pay | Admitting: Licensed Clinical Social Worker

## 2022-10-27 DIAGNOSIS — Z1509 Genetic susceptibility to other malignant neoplasm: Secondary | ICD-10-CM

## 2022-10-27 DIAGNOSIS — Z1379 Encounter for other screening for genetic and chromosomal anomalies: Secondary | ICD-10-CM

## 2022-10-27 NOTE — Progress Notes (Signed)
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 Please refer to our prior cancer genetics clinic note for more information regarding our discussion, assessment and recommendations, at the time. Nicole Norman recent genetic test results were disclosed to her, as were recommendations warranted by these results. These results and recommendations are discussed in more detail below.  CANCER HISTORY:  Oncology History   No history exists.    FAMILY HISTORY:  We obtained a detailed, 4-generation family history.  Significant diagnoses are listed below: Family History  Problem Relation Age of Onset   Alzheimer's disease Mother    Stroke Father    Breast cancer Sister 76   Colon cancer Sister 3       PMS2+ (Lynch syndrome)   Lung cancer Brother   Nicole Norman has 2 sons, ages 87 and 30, no cancers. She has 4 sisters and 9 brothers. One brother had lung cancer. One sister, Nicole Norman, had breast cancer at 31 and colon cancer at 58. 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 Patient has a niece that had breast cancer under age 36.    Nicole Norman mother died at 24, no known cancers on this side of the family.   Nicole Norman's father died at 37, no known cancers on this side of the family.   Nicole Norman is aware of previous family history of genetic testing for hereditary cancer risks. There is no reported Ashkenazi Jewish ancestry. There is no known consanguinity.    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   The test report has been scanned into EPIC and is located under the Molecular Pathology  section of the Results Review tab.  A portion of the result report is included below for reference. Genetic testing reported out on 09/07/2022.        Clinical Information Pathogenic variants in the PMS2 gene are associated with Lynch syndrome.   The cancers associated with PMS2 are: Colorectal cancer, 8.7-20% risk (average age of diagnosis is 7-66) Endometrial cancer, 13-26% risk (average age of diagnosis is 56-50) Ovarian cancer, up to a 3% risk (average age of diagnosis is 70-59) Renal pelvis and/or ureter, up to a 3.7% risk (average age of diagnosis is unknown) Breast, prostate, pancreatic, gastric, small bowel, bladder, biliary tract, and brain cancer risk may be elevated   Management Recommendations:   Colorectal Cancer Screening: High quality colonoscopy at age 24-35 or 2-5 years prior to the earliest colon cancer if it is diagnosed before age 67 and repeat every 1-3 years Studies have demonstrated that the use of daily aspirin decreases the colorectal cancer risk in patients with Lynch Syndrome. Ongoing studies are investigating the optimal dose and duration of use of aspirin for colon cancer prevention in Lynch Syndrome. The decision to use aspirin should be made on an individualized basis including a discussion of dose, benefits, and adverse effects.     Endometrial Cancer Screening/Risk Reduction: Women should report any abnormal uterine bleeding or postmenopausal bleeding. The evaluation of these symptoms should include an endometrial biopsy. A hysterectomy may be considered. The timing should be individualized based on whether childbearing is complete, comorbidities, and family history.  Endometrial cancer screening does not have a proven benefit in women with Lynch Syndrome. However, endometrial biopsy is highly sensitive and specific as a diagnostic procedure. Screening via endometrial biopsy every 1-2 years starting at age 68-35 can be considered. Transvaginal ultrasounds may be  considered in postmenopausal women at their clinician's discretion.   Ovarian Cancer Screening/Risk Reduction: There is currently insufficient evidence to make a specific recommendation for a prophylactic bilateral salpingo-oophorectomy (BSO), or having the ovaries and fallopian tubes removed, for individuals who carry a PMS2 pathogenic variant. Current data does not support routine ovarian screening for Lynch syndrome, therefore it may be considered at the clinician's discretion. Screening includes transvaginal ultrasounds and a blood test to measure CA-125 levels every 6-12 months. If there is a family history of ovarian cancer, have a discussion with your physician about the benefits and limitations of screening and risk reduction strategies.  Women should be aware of symptoms that might be associated with the development of ovarian cancer including pelvic or abdominal pain, bloating, increased abdominal girth, difficulty eating, early satiety, or urinary frequency or urgency. Symptoms that persist for several weeks and are a change from a woman's baseline should prompt evaluation by her physician.   Urothelial Cancer Screening: Annual urinalysis starting at age 40-35 may be considered in selected individuals such as those with a family history of urothelial cancer (renal pelvis, ureter, and/or bladder).   Gastric and Small Bowel Cancer Screening: While there is no clear evidence to support screening for gastric, duodenal, and small bowel cancer for Lynch Syndrome, select individuals, families, or those of Asian descent may consider esophagogastroduodenoscopy (EGD) with random biopsy of the proximal and distal stomach beginning at 64 and surveillance EDG every 3-5 years   Pancreatic Cancer Screening: Avoid smoking, heavy alcohol use It has been suggested that pancreatic cancer screening be limited to those with a family history of pancreatic cancer (first- or second-degree relative). Ideally,  screening should be performed in experienced centers utilizing a multidisciplinary approach under research conditions. Recommended screening include annual endoscopic ultrasound (preferred) and/or MRI of the pancreas starting at age 3 or 26 years younger than the earliest age diagnosis in the family. Annual concurrent CA19-9 testing should also be considered.   Prostate Cancer Screening: Consider beginning annual PSA blood test and digital rectal exams at age 50.   Breast Cancer Screening/Risk Reduction: Breast awareness beginning at age 11 Monthly self-breast examination beginning at age 78 Annual clinical breast examinations beginning at age 29 Annual mammogram beginning at age 93 with consideration of tomosynthesis Evidence is insufficient for a prophylactic risk-reducing mastectomy, manage based on family history     Brain Cancer Screening: Patients should be educated regarding signs and symptoms of neurologic cancer and the importance of prompt reporting of abnormal symptoms to their physicians.   Additional Considerations: Patients of reproductive age should be made aware of options for prenatal diagnosis and assisted reproduction including pre-implantation genetic diagnosis. Individuals with a single pathogenic PMS2 variant are also carriers of constitutional MMR deficiency (CMMRD) syndrome. CMMRD is a childhood-onset cancer predisposition syndrome that can present with hematological malignancies, cancers of the brain and central nervous system, Lynch syndrome-associated cancers (colon, uterine, small bowel, urinary tract), embryonic tumors, and sarcomas. Some affected individuals may also display cafe-au-lait macules. For there to be a risk of CMMRD in offspring, an individual and their partner would each have to have a single pathogenic variant in the same MMR gene; in such a case, the risk of having an affected  child is 25%.   This information is based on current understanding of the  gene and may change in the future.   Implications for Family Members: Hereditary predisposition to cancer due to pathogenic variants in the PMS2 gene has autosomal dominant inheritance. This means that an individual with a pathogenic variant has a 50% chance of passing the condition on to his/her offspring. Most cases are inherited from a parent, but some cases may occur spontaneously (i.e., an individual with a pathogenic variant has parents who do not have it). Identification of a pathogenic variant allows for the recognition of at-risk relatives who can pursue testing for the familial variant.   Family members are encouraged to consider genetic testing for this familial pathogenic variant. As there are generally no childhood cancer risks associated with pathogenic variants in the PSM2 gene, individuals in the family are not recommended to have testing until they reach at least 75 years of age. They may contact our office at [phone] for more information or to schedule an appointment.  Complimentary testing for the familial variant is available for 90 days from the report date. Family members who live outside of the area are encouraged to find a genetic counselor in their area by visiting: [email or web address].   PLAN: 1. These results will be made available to her PCP, Dr. Netty Starring and her GI provider, Dr. Haig Prophet. She would like these providers to follow her for this indication.   2. Ms. Tocci plans to discuss these results with her family and will reach out to [place] if we can be of any assistance in coordinating genetic testing for any of her relatives.    Resources FORCE (Facing Our Risk of Cancer Empowered) is a resource for those with a hereditary predisposition to develop cancer.  FORCE provides information about risk reduction, advocacy, legislation, and clinical trials.  Additionally, FORCE provides a platform for collaboration and support which includes: peer  navigation, message boards, local support groups, a toll-free helpline, research registry and recruitment, advocate training, published medical research, webinars, brochures, mastectomy photos, and more.  For more information, visit www.facingourrisk.[place].org   Lynch Syndrome [employer]- www.lynchcancers.com   [employer]- www.kintalk.org    We encouraged Ms. Ballon to remain in contact with [place] on an annual basis so we can update her personal and family histories, and let her know of advances in cancer genetics that may benefit the family. Our contact number was provided. Ms. Fifield questions were answered to her satisfaction today, and she knows she is welcome to call anytime with additional questions.    Nicole Rogue, MS, [facility] Genetic Counselor [email or web address] Phone: [phone]

## 2022-10-27 NOTE — Telephone Encounter (Signed)
I contacted Ms. Hedstrom to discuss her genetic testing results. 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  Detailed clinic note to follow.   The test report has been scanned into EPIC and is located under the Molecular Pathology section of  the Results Review tab.  A portion of the result report is included below for reference.       Faith Rogue, MS, [facility] Genetic Counselor [email or web address] Phone: [phone]

## 2022-11-06 DIAGNOSIS — D72829 Elevated white blood cell count, unspecified: Secondary | ICD-10-CM | POA: Diagnosis not present

## 2022-11-06 DIAGNOSIS — N289 Disorder of kidney and ureter, unspecified: Secondary | ICD-10-CM | POA: Diagnosis not present

## 2022-11-06 DIAGNOSIS — Z79899 Other long term (current) drug therapy: Secondary | ICD-10-CM | POA: Diagnosis not present

## 2022-11-06 DIAGNOSIS — R058 Other specified cough: Secondary | ICD-10-CM | POA: Diagnosis not present

## 2022-11-06 DIAGNOSIS — R5383 Other fatigue: Secondary | ICD-10-CM | POA: Diagnosis not present

## 2022-11-07 DIAGNOSIS — R059 Cough, unspecified: Secondary | ICD-10-CM | POA: Diagnosis not present

## 2022-11-28 ENCOUNTER — Ambulatory Visit: Payer: PPO | Admitting: Cardiology

## 2022-12-01 ENCOUNTER — Other Ambulatory Visit: Payer: Self-pay | Admitting: Cardiology

## 2022-12-05 DIAGNOSIS — R5383 Other fatigue: Secondary | ICD-10-CM | POA: Diagnosis not present

## 2022-12-05 DIAGNOSIS — G43009 Migraine without aura, not intractable, without status migrainosus: Secondary | ICD-10-CM | POA: Diagnosis not present

## 2022-12-08 ENCOUNTER — Telehealth: Payer: Self-pay | Admitting: Cardiology

## 2022-12-08 NOTE — Telephone Encounter (Signed)
Pt c/o medication issue:  1. Name of Medication:   metoprolol succinate (TOPROL-XL) 50 MG 24 hr tablet    2. How are you currently taking this medication (dosage and times per day)? As written   3. Are you having a reaction (difficulty breathing--STAT)? No   4. What is your medication issue?  Pt calling in stating "I have been seeing doctors for months and I cannot take it anymore. I think its my medications"   She is still experiencing dizziness, lightheadedness, and unable to concentrate.

## 2022-12-08 NOTE — Telephone Encounter (Signed)
Spoke with patient and she has had some symptoms which are concerning for her. She reports:  Headaches Dizziness Lightheadedness Unable to concentrate  She has concerns that this may be coming from her medication metoprolol and would like to be put on something different. Inquired how long she has been on the medication and she stated for years. Reviewed that is unlikely to be the cause but she states these symptoms are really bothersome for her. She has seen the following providers:   Neurology PCP Dr. Lalla Brothers said it had nothing to do with her heart.    She strongly feels it is her medication and she is concerned. Scheduled her to come in and see her provider. She verbalized understanding with no further questions at this time.

## 2022-12-11 ENCOUNTER — Encounter: Payer: Self-pay | Admitting: Cardiovascular Disease

## 2022-12-11 ENCOUNTER — Ambulatory Visit: Payer: PPO | Attending: Cardiovascular Disease | Admitting: Cardiovascular Disease

## 2022-12-11 VITALS — BP 131/83 | HR 81 | Ht 59.5 in | Wt 108.5 lb

## 2022-12-11 DIAGNOSIS — Z9889 Other specified postprocedural states: Secondary | ICD-10-CM

## 2022-12-11 DIAGNOSIS — R42 Dizziness and giddiness: Secondary | ICD-10-CM | POA: Diagnosis not present

## 2022-12-11 DIAGNOSIS — I493 Ventricular premature depolarization: Secondary | ICD-10-CM | POA: Diagnosis not present

## 2022-12-11 NOTE — Patient Instructions (Signed)
Medication Instructions:  None ordered *If you need a refill on your cardiac medications before your next appointment, please call your pharmacy*   Lab Work: None ordered If you have labs (blood work) drawn today and your tests are completely normal, you will receive your results only by: MyChart Message (if you have MyChart) OR A paper copy in the mail If you have any lab test that is abnormal or we need to change your treatment, we will call you to review the results.   Testing/Procedures: None ordered   Follow-Up: At [facility], you and your health needs are our priority.  As part of our continuing mission to provide you with exceptional heart care, we have created designated Provider Care Teams.  These Care Teams include your primary Cardiologist (physician) and Advanced Practice Providers (APPs -  Physician Assistants and Nurse Practitioners) who all work together to provide you with the care you need, when you need it.  We recommend signing up for the patient portal called "MyChart".  Sign up information is provided on this After Visit Summary.  MyChart is used to connect with patients for Virtual Visits (Telemedicine).  Patients are able to view lab/test results, encounter notes, upcoming appointments, etc.  Non-urgent messages can be sent to your provider as well.   To learn more about what you can do with MyChart, go to [email or web address].    Your next appointment:   6 month(s)  Provider:   You may see Lorine Bears, MD or one of the following Advanced Practice Providers on your designated Care Team:   Nicolasa Ducking, NP Eula Listen, PA-C Cadence Fransico Michael, PA-C Charlsie Quest, NP

## 2022-12-11 NOTE — Progress Notes (Signed)
Cardiology Office Note   Date:  12/11/2022   ID:  Nicole Norman, DOB 1945-12-13, MRN [id number]  PCP:  Marisue Ivan, MD  Cardiologist:   Lorine Bears, MD   Chief Complaint  Patient presents with   Dizziness    Patient c/o "head spinning at night when lying down" tingling in face, arms & toes,dizziness and headache for several months. Medications reviewed by the patient verbally.       History of Present Illness: Nicole Norman is a 77 y.o. female who presents for a follow-up visit regarding mitral valve prolapse with regurgitation status post minimally invasive mitral valve repair in November 2017. Cardiac catheterization before mitral valve surgery showed no significant coronary artery disease. She has no history of diabetes, hypertension or hyperlipidemia.  She quit smoking in 2017. She is known to have frequent PVCs with previous 48-hour Holter monitor showing a total of 53,000 beats representing 23% burden with short runs of nonsustained ventricular tachycardia. She was treated by Dr. Graciela Husbands with flecainide but most recently, she was not tolerating the medication very well and thus she was referred to Dr. Lalla Brothers.  It was felt that ablation would be difficult given previous mitral valve repair.   Fortunately, her PVCs have been well-controlled with Toprol. Most recent echocardiogram in February 2023 showed normal LV systolic function with trivial mitral regurgitation and no significant stenosis across the mitral valve prosthesis.  Mean gradient was 4 mmHg.  She returns due to concerns about multiple neurologic symptoms including tingling throughout her body with headaches.  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  She also saw his primary care physician.  She does complain of dizziness.  She is concerned that some of the symptoms might be related to Toprol.   Past Medical History:  Diagnosis Date   CHF  (congestive heart failure) (HCC)    a. In setting of severe MR; b. 06/2016 TEE: EF 65-60%.   History of bronchitis    History of pneumonia    Incidental pulmonary nodule 06/13/2016   Multiple small ground glass opacities seen on chest CT scan   Migraine    1x/month - uses Aleve   Normal coronary arteries    a. cardiac cath 06/19/2016: normal coronary arteries and left dominant system, LVEDP nl, 4+ mitral regurgitation, RHC showed mildly elevated LVEDP ( ), giant V waves noted on PCWP w/ mild pulmonary HTN., pulmonary pressure 39/10 with a mean of 27 mmHg, mildly reduced cardiac output at 3.1 with a cardiac index of 2.09, recommned mitral valve repair, acceptable volume status   Paroxysmal SVT (supraventricular tachycardia)    PVC's (premature ventricular contractions)    a. 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 07/02/2016 Complex valvuloplasty including quadrangular resection of posterior leaflet, folding plasty, Gore-tex neochord placement x6 and 32 mm Sorin Memo 3D ring annuloplasty with clipping of LA appendage via right mini thoracotomy approach.   Severe mitral regurgitation    a. TTE 06/05/16: EF 60-65%, nl WM, LV dias fxn nl, mod prolapse of posterior mitral leaflet with mod eccentric regurg, LA mildly dilated, PASP nl; b. TEE 06/18/16: EF 65-70%, LV mildly dilated, no valvular vegetations seen throughout, mitral valve w/ flail motion involving the middle scallop of the post leaflet. Severe regurgitation directed eccentrically, LA mildly dilated   Tobacco abuse    Vertigo  no meds    Past Surgical History:  Procedure Laterality Date   CARDIAC CATHETERIZATION N/A 06/19/2016   Procedure: Right/Left Heart Cath and Coronary Angiography;  Surgeon: Iran Ouch, MD;  Location: [facility] INVASIVE [facility];  Service: Cardiovascular;  Laterality: N/A;   CESAREAN SECTION     CHOLECYSTECTOMY     CLIPPING OF ATRIAL APPENDAGE  N/A 07/02/2016   Procedure: CLIPPING OF ATRIAL APPENDAGE;  Surgeon: Purcell Nails, MD;  Location: [facility] OR;  Service: Open Heart Surgery;  Laterality: N/A;   COLONOSCOPY N/A 12/07/2014   Procedure: COLONOSCOPY;  Surgeon: Midge Minium, MD;  Location: [facility];  Service: Gastroenterology;  Laterality: N/A;   COLONOSCOPY WITH PROPOFOL N/A 04/29/2022   Procedure: COLONOSCOPY WITH PROPOFOL;  Surgeon: Regis Bill, MD;  Location: [facility] ENDOSCOPY;  Service: Endoscopy;  Laterality: N/A;   ESOPHAGOGASTRODUODENOSCOPY N/A 12/07/2014   Procedure: ESOPHAGOGASTRODUODENOSCOPY (EGD);  Surgeon: Midge Minium, MD;  Location: [facility];  Service: Gastroenterology;  Laterality: N/A;   EYE SURGERY Bilateral    lasic   MITRAL VALVE REPAIR Right 07/02/2016   Procedure: MINIMALLY INVASIVE MITRAL VALVE REPAIR (MVR);  Surgeon: Purcell Nails, MD;  Location: [facility] OR;  Service: Open Heart Surgery;  Laterality: Right;   TEE WITHOUT CARDIOVERSION N/A 06/18/2016   Procedure: TRANSESOPHAGEAL ECHOCARDIOGRAM (TEE);  Surgeon: Yvonne Kendall, MD;  Location: [facility] ORS;  Service: Cardiovascular;  Laterality: N/A;   TEE WITHOUT CARDIOVERSION N/A 07/02/2016   Procedure: TRANSESOPHAGEAL ECHOCARDIOGRAM (TEE);  Surgeon: Purcell Nails, MD;  Location: [facility] OR;  Service: Open Heart Surgery;  Laterality: N/A;   TONSILLECTOMY       Current Outpatient Medications  Medication Sig Dispense Refill   ascorbic acid (VITAMIN C) 500 MG tablet Take 500 mg by mouth daily.     aspirin EC 81 MG tablet Take 1 tablet (81 mg total) by mouth daily.     calcium-vitamin D (OSCAL WITH D) 500-200 MG-UNIT tablet Take 1 tablet by mouth.     cholecalciferol (VITAMIN D) 1000 units tablet Take 5,000 Units by mouth daily.     Docusate Sodium (DSS) 100 MG CAPS Take by mouth as needed.     Magnesium Gluconate 550 MG TABS Take 30 mg by mouth daily.     metoprolol succinate (TOPROL-XL) 50 MG 24 hr tablet TAKE 1 TABLET BY MOUTH ONCE DAILY TAKE  WITH   OR  IMMEDIATELY  FOLLOWING  A  MEAL 90 tablet 1   Multiple Vitamin (MULTIVITAMIN) tablet Take 1 tablet by mouth daily.     vitamin B-12 (CYANOCOBALAMIN) 1000 MCG tablet Take 1,000 mcg by mouth daily.     Zinc Citrate 16.67 MG CHEW Chew by mouth daily.     No current facility-administered medications for this visit.    Allergies:   Patient has no known allergies.    Social History:  The patient  reports that she has been smoking cigarettes. She has a 22.50 pack-year smoking history. She has never used smokeless tobacco. She reports current alcohol use. She reports that she does not use drugs.   Family History:  The patient's family history includes Alzheimer's disease in her mother; Breast cancer (age of onset: 42) in her sister; Colon cancer (age of onset: 42) in her sister; Lung cancer in her brother; Stroke in her father.    ROS:  Please see the history of present illness.   Otherwise, review of systems are positive for none.   All other systems are reviewed and negative.  PHYSICAL EXAM: VS:  BP 131/83 (BP Location: Left Arm, Patient Position: Sitting, Cuff Size: Normal)   Pulse 81   Ht 4' 11.5" (1.511 m)   Wt 108 lb 8 oz (49.2 kg)   SpO2 93%   BMI 21.55 kg/m  , BMI Body mass index is 21.55 kg/m. GEN: Well nourished, well developed, in no acute distress  HEENT: normal  Neck: no JVD, carotid bruits, or masses Cardiac: RRR ; no rubs, murmurs or gallops,no edema . Respiratory:  clear to auscultation bilaterally, normal work of breathing GI: soft, nontender, nondistended, + BS MS: no deformity or atrophy  Skin: warm and dry, no rash Neuro:  Strength and sensation are intact Psych: euthymic mood, full affect   EKG:  EKG is ordered today. EKG showed sinus rhythm with 1 PVC.  No significant ST or T wave changes.   Recent Labs: No results found for requested labs within last 365 days.    Lipid Panel    Component Value Date/Time   CHOL 138 06/16/2016 0410   TRIG 112  06/16/2016 0410   HDL 50 06/16/2016 0410   CHOLHDL 2.8 06/16/2016 0410   VLDL 22 06/16/2016 0410   LDLCALC 66 06/16/2016 0410      Wt Readings from Last 3 Encounters:  12/11/22 108 lb 8 oz (49.2 kg)  10/01/22 109 lb 12.8 oz (49.8 kg)  08/14/22 111 lb 2 oz (50.4 kg)           No data to display            ASSESSMENT AND PLAN:  1.  Mitral valve disease with mitral valve prolapse: Status post mitral valve repair in November 2017.  Most recent echo showed normal functioning mitral valve with minimal gradient.   2. Frequent PVCs: Well-controlled on Toprol 50 mg twice daily.  3.  Generalized tingling, mild headache and dizziness: I am not convinced that the symptoms are related to a cardiac etiology or metoprolol.  She is minimally orthostatic today not enough to explain her symptoms.  She is going to follow-up with neurology.  I did give her the option of switching Toprol to diltiazem but she wants to continue with Toprol for now.  This can be considered upon follow-up. Given her ongoing symptoms and limitations, I agreed to give her a temporary handicap sticker for 6 months.   Disposition: Follow-up in 6 months.  Signed,  Lorine Bears, MD  12/11/2022 1:08 PM    [facility]

## 2023-01-20 DIAGNOSIS — R059 Cough, unspecified: Secondary | ICD-10-CM | POA: Diagnosis not present

## 2023-01-20 DIAGNOSIS — R053 Chronic cough: Secondary | ICD-10-CM | POA: Diagnosis not present

## 2023-01-20 DIAGNOSIS — F17210 Nicotine dependence, cigarettes, uncomplicated: Secondary | ICD-10-CM | POA: Diagnosis not present

## 2023-01-27 DIAGNOSIS — H43812 Vitreous degeneration, left eye: Secondary | ICD-10-CM | POA: Diagnosis not present

## 2023-01-27 DIAGNOSIS — H43393 Other vitreous opacities, bilateral: Secondary | ICD-10-CM | POA: Diagnosis not present

## 2023-01-27 DIAGNOSIS — H2513 Age-related nuclear cataract, bilateral: Secondary | ICD-10-CM | POA: Diagnosis not present

## 2023-02-17 DIAGNOSIS — J4489 Other specified chronic obstructive pulmonary disease: Secondary | ICD-10-CM | POA: Diagnosis not present

## 2023-02-17 DIAGNOSIS — F17210 Nicotine dependence, cigarettes, uncomplicated: Secondary | ICD-10-CM | POA: Diagnosis not present

## 2023-02-17 DIAGNOSIS — R053 Chronic cough: Secondary | ICD-10-CM | POA: Diagnosis not present

## 2023-04-20 ENCOUNTER — Other Ambulatory Visit: Payer: Self-pay | Admitting: Emergency Medicine

## 2023-04-20 DIAGNOSIS — Z87891 Personal history of nicotine dependence: Secondary | ICD-10-CM

## 2023-04-20 DIAGNOSIS — F17210 Nicotine dependence, cigarettes, uncomplicated: Secondary | ICD-10-CM

## 2023-04-20 DIAGNOSIS — Z122 Encounter for screening for malignant neoplasm of respiratory organs: Secondary | ICD-10-CM

## 2023-04-23 NOTE — Progress Notes (Signed)
  Virtual Visit via Telephone Note  I connected with Nastasia Demauro, 04/29/23 8:50 AM by a telemedicine application and verified that I am speaking with the correct person using two identifiers.  Location:  Patient: home   I discussed the limitations of evaluation and management by telemedicine and the availability of in person appointments. The patient expressed understanding and agreed to proceed.   Shared Decision Making Visit Lung Cancer Screening Program [phone])   Eligibility:  77 y.o. Pack Years Smoking History Calculation= 26.5 (# packs/per year x # years smoked) 8yrs x 1/2ppd Recent History of coughing up blood  no Unexplained weight loss? no ( >Than 15 pounds within the last 6 months ) Prior History Lung / other cancer no (Diagnosis within the last 5 years already requiring surveillance chest CT Scans). Smoking Status Current Smoker   Visit Components: Discussion included one or more decision making aids. YES Discussion included risk/benefits of screening. YES Discussion included potential follow up diagnostic testing for abnormal scans. YES Discussion included meaning and risk of over diagnosis. YES Discussion included meaning and risk of False Positives. YES Discussion included meaning of total radiation exposure. YES  Counseling Included: Importance of adherence to annual lung cancer LDCT screening. YES Impact of comorbidities on ability to participate in the program. YES Ability and willingness to under diagnostic treatment. YES  Smoking Cessation Counseling: Current Smokers:  Discussed importance of smoking cessation. yes Information about tobacco cessation classes and interventions provided to patient. yes Patient provided with "ticket" for LDCT Scan. yes Symptomatic Patient. no  Counseling(Intermediate counseling: > three minutes) 99406 Diagnosis Code: Tobacco Use Z72.0 Asymptomatic Patient no  Counseling (Intermediate counseling: > three minutes  counseling) E9528  Diagnosis Code: Personal History of Nicotine Dependence. U13.244 Information about tobacco cessation classes and interventions provided to patient. Refused Patient provided with "ticket" for LDCT Scan. yes Written Order for Lung Cancer Screening with LDCT placed in Epic. Yes (CT Chest Lung Cancer Screening Low Dose W/O CM) [id number]  Z12.2-Screening of respiratory organs Z87.891-Personal history of nicotine dependence   Danford Bad 04/29/23

## 2023-04-23 NOTE — Patient Instructions (Addendum)
Thank you for participating in the [facility] Cancer Screening Program. It was our pleasure to meet you today. We will call you with the results of your scan within the next few days. Your scan will be assigned a Lung RADS category score by the physicians reading the scans.  This Lung RADS score determines follow up scanning.  See below for description of categories, and follow up screening recommendations. We will be in touch to schedule your follow up screening annually or based on recommendations of our providers. We will fax a copy of your scan results to your Primary Care Physician, or the physician who referred you to the program, to ensure they have the results. Please call the office if you have any questions or concerns regarding your scanning experience or results.  Our office number is [phone]. Please speak with Abigail Miyamoto, RN., Karlton Lemon RN, or Pietro Cassis RN. They are  our Lung Cancer Screening RN.'s If They are unavailable when you call, Please leave a message on the voice mail. We will return your call at our earliest convenience.This voice mail is monitored several times a day.  Remember, if your scan is normal, we will scan you annually as long as you continue to meet the criteria for the program. (Age 75-80, Current smoker or smoker who has quit within the last 15 years). If you are a smoker, remember, quitting is the single most powerful action that you can take to decrease your risk of lung cancer and other pulmonary, breathing related problems. We know quitting is hard, and we are here to help.  Please let us know if there is anything we can do to help you meet your goal of quitting. If you are a former smoker, Counselling psychologist. We are proud of you! Remain smoke free! Remember you can refer friends or family members through the number above.  We will screen them to make sure they meet criteria for the program. Thank you for helping [place] take better care of you  by participating in Lung Screening.   Lung RADS Categories:  Lung RADS 1: no nodules or definitely non-concerning nodules.  Recommendation is for a repeat annual scan in 12 months.  Lung RADS 2:  nodules that are non-concerning in appearance and behavior with a very low likelihood of becoming an active cancer. Recommendation is for a repeat annual scan in 12 months.  Lung RADS 3: nodules that are probably non-concerning , includes nodules with a low likelihood of becoming an active cancer.  Recommendation is for a 24-month repeat screening scan. Often noted after an upper respiratory illness. We will be in touch to make sure you have no questions, and to schedule your 75-month scan.  Lung RADS 4 A: nodules with concerning findings, recommendation is most often for a follow up scan in 3 months or additional testing based on our provider's assessment of the scan. We will be in touch to make sure you have no questions and to schedule the recommended 3 month follow up scan.  Lung RADS 4 B:  indicates findings that are concerning. We will be in touch with you to schedule additional diagnostic testing based on our provider's  assessment of the scan.  You can receive free nicotine replacement therapy ( patches, gum or mints) by calling 1-800-QUIT NOW. Please call so we can get you on the path to becoming  a non-smoker. I know it is hard, but you can do this!  Other options for assistance in  smoking cessation ( As covered by your insurance benefits)  Hypnosis for smoking cessation  [employer] [phone]  Acupuncture for smoking cessation  [employer] [phone]    Steps to Quit Smoking Smoking tobacco is the leading cause of preventable death. It can affect almost every organ in the body. Smoking puts you and those around you at risk for developing many serious chronic diseases. Quitting smoking can be very challenging. Do not get discouraged if you are not  successful the first time. Some people need to make many attempts to quit before they achieve long-term success. Do your best to stick to your quit plan, and talk with your health care provider if you have any questions or concerns. How do I get ready to quit? When you decide to quit smoking, create a plan to help you succeed. Before you quit: Pick a date to quit. Set a date within the next 2 weeks to give you time to prepare. Write down the reasons why you are quitting. Keep this list in places where you will see it often. Tell your family, friends, and co-workers that you are quitting. Support from people you are close to can make quitting easier. Talk with your health care provider about your options for quitting smoking. Find out what treatment options are covered by your health insurance. Identify people, places, things, and activities that make you want to smoke (triggers). Avoid them. What first steps can I take to quit smoking? Throw away all cigarettes at home, at work, and in your car. Throw away smoking accessories, such as Set designer. Clean your car. Make sure to empty the ashtray. Clean your home, including curtains and carpets. What strategies can I use to quit smoking? Talk with your health care provider about combining strategies, such as taking medicines while you are also receiving in-person counseling. Using these two strategies together makes you more likely to succeed in quitting than if you used either strategy on its own. If you are pregnant or breastfeeding, talk with your health care provider about finding counseling or other support strategies to quit smoking. Do not take medicine to help you quit smoking unless your health care provider tells you to. Quit right away Quit smoking completely, instead of gradually reducing how much you smoke over a period of time. Stopping smoking right away may be more successful than gradually quitting. Attend in-person  counseling to help you build problem-solving skills. You are more likely to succeed in quitting if you attend counseling sessions regularly. Even short sessions of 10 minutes can be effective. Take medicine You may take medicines to help you quit smoking. Some medicines require a prescription. You can also purchase over-the-counter medicines. Medicines may have nicotine in them to replace the nicotine in cigarettes. Medicines may: Help to stop cravings. Help to relieve withdrawal symptoms. Your health care provider may recommend: Nicotine patches, gum, or lozenges. Nicotine inhalers or sprays. Non-nicotine medicine that you take by mouth. Find resources Find resources and support systems that can help you quit smoking and remain smoke-free after you quit. These resources are most helpful when you use them often. They include: Online chats with a Veterinary surgeon. Telephone quitlines. Printed Materials engineer. Support groups or group counseling. Text messaging programs. Mobile phone apps or applications. Use apps that can help you stick to your quit plan by providing reminders, tips, and encouragement. Examples of free services include Quit Guide from the CDC and smokefree.gov  What can I do to  make it easier to quit?  Reach out to your family and friends for support and encouragement. Call telephone quitlines, such as 1-800-QUIT-NOW, reach out to support groups, or work with a counselor for support. Ask people who smoke to avoid smoking around you. Avoid places that trigger you to smoke, such as bars, parties, or smoke-break areas at work. Spend time with people who do not smoke. Lessen the stress in your life. Stress can be a smoking trigger for some people. To lessen stress, try: Exercising regularly. Doing deep-breathing exercises. Doing yoga. Meditating. What benefits will I see if I quit smoking? Over time, you should start to see positive results, such as: Improved sense of smell and  taste. Decreased coughing and sore throat. Slower heart rate. Lower blood pressure. Clearer and healthier skin. The ability to breathe more easily. Fewer sick days. Summary Quitting smoking can be very challenging. Do not get discouraged if you are not successful the first time. Some people need to make many attempts to quit before they achieve long-term success. When you decide to quit smoking, create a plan to help you succeed. Quit smoking right away, not slowly over a period of time. Find resources and support systems that can help you quit smoking and remain smoke-free after you quit. This information is not intended to replace advice given to you by your health care provider. Make sure you discuss any questions you have with your health care provider. Document Revised: 07/12/2021 Document Reviewed: 07/12/2021 Elsevier Patient Education  2024 [employer].

## 2023-04-29 ENCOUNTER — Encounter: Payer: Self-pay | Admitting: Adult Health

## 2023-04-29 ENCOUNTER — Ambulatory Visit: Payer: PPO | Admitting: Adult Health

## 2023-04-29 DIAGNOSIS — Z87891 Personal history of nicotine dependence: Secondary | ICD-10-CM | POA: Diagnosis not present

## 2023-04-29 DIAGNOSIS — Z720 Tobacco use: Secondary | ICD-10-CM

## 2023-05-01 ENCOUNTER — Ambulatory Visit
Admission: RE | Admit: 2023-05-01 | Discharge: 2023-05-01 | Disposition: A | Discharge status: Home / Self Care | Payer: PPO | Source: Ambulatory Visit | Attending: Acute Care | Admitting: Acute Care

## 2023-05-01 DIAGNOSIS — Z122 Encounter for screening for malignant neoplasm of respiratory organs: Secondary | ICD-10-CM | POA: Insufficient documentation

## 2023-05-01 DIAGNOSIS — Z87891 Personal history of nicotine dependence: Secondary | ICD-10-CM | POA: Diagnosis not present

## 2023-05-01 DIAGNOSIS — F17210 Nicotine dependence, cigarettes, uncomplicated: Secondary | ICD-10-CM | POA: Diagnosis not present

## 2023-05-27 ENCOUNTER — Telehealth: Payer: Self-pay | Admitting: Acute Care

## 2023-05-27 DIAGNOSIS — R911 Solitary pulmonary nodule: Secondary | ICD-10-CM

## 2023-05-27 DIAGNOSIS — Z122 Encounter for screening for malignant neoplasm of respiratory organs: Secondary | ICD-10-CM

## 2023-05-27 NOTE — Telephone Encounter (Signed)
Patient had LDCT on 05/01/2023 that resulted as a Lung-RADS 2 with the radiologist recommending an annual scan. There is a notation of 13mm mediastinal node. After review by Kandice Robinsons NP she has recommended a 6 month follow up scan to assure stability of node.

## 2023-06-01 NOTE — Telephone Encounter (Signed)
Called and spoke to pt. Informed her of the results per Kandice Robinsons NP and the recommendations to have a 6 month follow up scan to re-assess the mediastinal lymph node. Patient verbalized understanding and is agreeable to plan. Order placed for 6 month follow up CT. Results and plan sent to PCP.

## 2023-07-06 ENCOUNTER — Telehealth: Payer: Self-pay | Admitting: Cardiology

## 2023-07-06 MED ORDER — METOPROLOL SUCCINATE ER 50 MG PO TB24: ORAL_TABLET | ORAL | 0 refills | Status: DC

## 2023-07-06 NOTE — Telephone Encounter (Signed)
Requested Prescriptions   Signed Prescriptions Disp Refills   metoprolol succinate (TOPROL-XL) 50 MG 24 hr tablet 90 tablet 0    Sig: TAKE 1 TABLET BY MOUTH ONCE DAILY TAKE  WITH  OR  IMMEDIATELY  FOLLOWING  A  MEAL    Authorizing Provider: Lanier Prude    Ordering User: Kendrick Fries

## 2023-07-06 NOTE — Telephone Encounter (Signed)
*  STAT* If patient is at the pharmacy, call can be transferred to refill team.   1. Which medications need to be refilled? (please list name of each medication and dose if known)   metoprolol succinate (TOPROL-XL) 50 MG 24 hr tablet     4. Which pharmacy/location (including street and city if local pharmacy) is medication to be sent to?[id number] - [place], [place] - [address]    5. Do they need a 30 day or 90 day supply? 90     Pt called in stating she is completely out

## 2023-07-06 NOTE — Telephone Encounter (Signed)
Please contact pt for future appointment pt due for follow up for 6 month f/u with Dr. Kirke Corin.

## 2023-07-14 DIAGNOSIS — H903 Sensorineural hearing loss, bilateral: Secondary | ICD-10-CM | POA: Diagnosis not present

## 2023-07-14 DIAGNOSIS — H6123 Impacted cerumen, bilateral: Secondary | ICD-10-CM | POA: Diagnosis not present

## 2023-07-27 DIAGNOSIS — R531 Weakness: Secondary | ICD-10-CM | POA: Diagnosis not present

## 2023-08-14 ENCOUNTER — Other Ambulatory Visit: Payer: Self-pay | Admitting: Family Medicine

## 2023-08-14 DIAGNOSIS — R519 Headache, unspecified: Secondary | ICD-10-CM

## 2023-08-14 DIAGNOSIS — R42 Dizziness and giddiness: Secondary | ICD-10-CM

## 2023-08-14 DIAGNOSIS — R531 Weakness: Secondary | ICD-10-CM

## 2023-08-19 ENCOUNTER — Ambulatory Visit
Admission: RE | Admit: 2023-08-19 | Discharge: 2023-08-19 | Disposition: A | Discharge status: Home / Self Care | Payer: PPO | Source: Ambulatory Visit | Attending: Family Medicine | Admitting: Family Medicine

## 2023-08-19 DIAGNOSIS — R531 Weakness: Secondary | ICD-10-CM | POA: Diagnosis not present

## 2023-08-19 DIAGNOSIS — R42 Dizziness and giddiness: Secondary | ICD-10-CM | POA: Insufficient documentation

## 2023-08-19 DIAGNOSIS — R519 Headache, unspecified: Secondary | ICD-10-CM | POA: Insufficient documentation

## 2023-09-24 ENCOUNTER — Ambulatory Visit: Payer: PPO | Admitting: Cardiovascular Disease

## 2023-10-01 ENCOUNTER — Encounter: Payer: Self-pay | Admitting: Cardiovascular Disease

## 2023-10-01 ENCOUNTER — Ambulatory Visit: Payer: PPO | Attending: Cardiovascular Disease | Admitting: Cardiovascular Disease

## 2023-10-01 VITALS — BP 116/68 | HR 76 | Ht 59.5 in | Wt 105.8 lb

## 2023-10-01 DIAGNOSIS — I493 Ventricular premature depolarization: Secondary | ICD-10-CM

## 2023-10-01 DIAGNOSIS — R519 Headache, unspecified: Secondary | ICD-10-CM

## 2023-10-01 DIAGNOSIS — I059 Rheumatic mitral valve disease, unspecified: Secondary | ICD-10-CM | POA: Diagnosis not present

## 2023-10-01 NOTE — Progress Notes (Signed)
 Cardiology Office Note   Date:  10/01/2023   ID:  Nicole Norman, DOB 02-27-1946, MRN [id number]  PCP:  Marisue Ivan, MD  Cardiologist:   Lorine Bears, MD   Chief Complaint  Patient presents with   Follow-up    6 month follow up visit. Patient states that she is feeling a little dizzy. She said that her legs and arms feel like they don't want to work. Patient states that she hasn't had a spell like this since about 3 weeks ago.  Meds reviewed.      History of Present Illness: Nicole Norman is a 78 y.o. female who presents for a follow-up visit regarding mitral valve prolapse with regurgitation status post minimally invasive mitral valve repair in November 2017. Cardiac catheterization before mitral valve surgery showed no significant coronary artery disease. She has no history of diabetes, hypertension or hyperlipidemia.  She quit smoking in 2017. She is known to have frequent PVCs with previous 48-hour Holter monitor showing a total of 53,000 beats representing 23% burden with short runs of nonsustained ventricular tachycardia. She was treated by Dr. Graciela Husbands with flecainide but most recently, she was not tolerating the medication very well and thus she was referred to Dr. Lalla Brothers.  It was felt that ablation would be difficult given previous mitral valve repair.   Fortunately, her PVCs have been well-controlled with Toprol. Most recent echocardiogram in February 2023 showed normal LV systolic function with trivial mitral regurgitation and no significant stenosis across the mitral valve prosthesis.  Mean gradient was 4 mmHg.  She reports worsening dyspnea with no chest pain.  She is mostly bothered by increased headache, leg weakness and tingling sensation throughout her body.  She feels anxious.   Past Medical History:  Diagnosis Date   CHF (congestive heart failure) (HCC)    a. In setting of severe MR; b. 06/2016 TEE: EF 65-60%.   History of bronchitis     History of pneumonia    Incidental pulmonary nodule 06/13/2016   Multiple small ground glass opacities seen on chest CT scan   Migraine    1x/month - uses Aleve   Normal coronary arteries    a. cardiac cath 06/19/2016: normal coronary arteries and left dominant system, LVEDP nl, 4+ mitral regurgitation, RHC showed mildly elevated LVEDP ( ), giant V waves noted on PCWP w/ mild pulmonary HTN., pulmonary pressure 39/10 with a mean of 27 mmHg, mildly reduced cardiac output at 3.1 with a cardiac index of 2.09, recommned mitral valve repair, acceptable volume status   Paroxysmal SVT (supraventricular tachycardia) (HCC)    PVC's (premature ventricular contractions)    a. 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 07/02/2016 Complex valvuloplasty including quadrangular resection of posterior leaflet, folding plasty, Gore-tex neochord placement x6 and 32 mm Sorin Memo 3D ring annuloplasty with clipping of LA appendage via right mini thoracotomy approach.   Severe mitral regurgitation    a. TTE 06/05/16: EF 60-65%, nl WM, LV dias fxn nl, mod prolapse of posterior mitral leaflet with mod eccentric regurg, LA mildly dilated, PASP nl; b. TEE 06/18/16: EF 65-70%, LV mildly dilated, no valvular vegetations seen throughout, mitral valve w/ flail motion involving the middle scallop of the post leaflet. Severe regurgitation directed eccentrically, LA mildly dilated   Tobacco abuse    Vertigo    no meds    Past Surgical History:  Procedure Laterality Date   CARDIAC  CATHETERIZATION N/A 06/19/2016   Procedure: Right/Left Heart Cath and Coronary Angiography;  Surgeon: Iran Ouch, MD;  Location: [facility] IN[facility]ASIVE CV [facility];  Service: Cardiovascular;  Laterality: N/A;   CESAREAN SECTION     CHOLECYSTECTOMY     CLIPPING OF ATRIAL APPENDAGE N/A 07/02/2016   Procedure: CLIPPING OF ATRIAL APPENDAGE;  Surgeon: Purcell Nails, MD;  Location: [facility] OR;  Service:  Open Heart Surgery;  Laterality: N/A;   COLONOSCOPY N/A 12/07/2014   Procedure: COLONOSCOPY;  Surgeon: Midge Minium, MD;  Location: [facility];  Service: Gastroenterology;  Laterality: N/A;   COLONOSCOPY WITH PROPOFOL N/A 04/29/2022   Procedure: COLONOSCOPY WITH PROPOFOL;  Surgeon: Regis Bill, MD;  Location: [facility] ENDOSCOPY;  Service: Endoscopy;  Laterality: N/A;   ESOPHAGOGASTRODUODENOSCOPY N/A 12/07/2014   Procedure: ESOPHAGOGASTRODUODENOSCOPY (EGD);  Surgeon: Midge Minium, MD;  Location: [facility];  Service: Gastroenterology;  Laterality: N/A;   EYE SURGERY Bilateral    lasic   MITRAL VALVE REPAIR Right 07/02/2016   Procedure: MINIMALLY INVASIVE MITRAL VALVE REPAIR (MVR);  Surgeon: Purcell Nails, MD;  Location: [facility] OR;  Service: Open Heart Surgery;  Laterality: Right;   TEE WITHOUT CARDIOVERSION N/A 06/18/2016   Procedure: TRANSESOPHAGEAL ECHOCARDIOGRAM (TEE);  Surgeon: Yvonne Kendall, MD;  Location: [facility] ORS;  Service: Cardiovascular;  Laterality: N/A;   TEE WITHOUT CARDIOVERSION N/A 07/02/2016   Procedure: TRANSESOPHAGEAL ECHOCARDIOGRAM (TEE);  Surgeon: Purcell Nails, MD;  Location: [facility] OR;  Service: Open Heart Surgery;  Laterality: N/A;   TONSILLECTOMY       Current Outpatient Medications  Medication Sig Dispense Refill   ascorbic acid (VITAMIN C) 500 MG tablet Take 500 mg by mouth daily.     aspirin EC 81 MG tablet Take 1 tablet (81 mg total) by mouth daily.     calcium-vitamin D (OSCAL WITH D) 500-200 MG-UNIT tablet Take 1 tablet by mouth.     cholecalciferol (VITAMIN D) 1000 units tablet Take 5,000 Units by mouth daily.     Docusate Sodium (DSS) 100 MG CAPS Take by mouth as needed.     Magnesium Gluconate 550 MG TABS Take 30 mg by mouth daily.     metoprolol succinate (TOPROL-XL) 50 MG 24 hr tablet TAKE 1 TABLET BY MOUTH ONCE DAILY TAKE  WITH  OR  IMMEDIATELY  FOLLOWING  A  MEAL 90 tablet 0   Multiple Vitamin (MULTIVITAMIN) tablet Take 1 tablet by mouth  daily.     vitamin B-12 (CYANOCOBALAMIN) 1000 MCG tablet Take 1,000 mcg by mouth daily.     Zinc Citrate 16.67 MG CHEW Chew by mouth daily.     No current facility-administered medications for this visit.    Allergies:   Patient has no known allergies.    Social History:  The patient  reports that she has been smoking cigarettes. She started smoking about 52 years ago. She has a 22.5 pack-year smoking history. She has never used smokeless tobacco. She reports current alcohol use. She reports that she does not use drugs.   Family History:  The patient's family history includes Alzheimer's disease in her mother; Breast cancer (age of onset: 17) in her sister; Colon cancer (age of onset: 50) in her sister; Lung cancer in her brother; Stroke in her father.    ROS:  Please see the history of present illness.   Otherwise, review of systems are positive for none.   All other systems are reviewed and negative.    PHYSICAL EXAM: VS:  BP 116/68  Pulse 76   Ht 4' 11.5" (1.511 m)   Wt 105 lb 12.8 oz (48 kg)   SpO2 94%   BMI 21.01 kg/m  , BMI Body mass index is 21.01 kg/m. GEN: Well nourished, well developed, in no acute distress  HEENT: normal  Neck: no JVD, carotid bruits, or masses Cardiac: RRR ; no rubs, murmurs or gallops,no edema . Respiratory:  clear to auscultation bilaterally, normal work of breathing GI: soft, nontender, nondistended, + BS MS: no deformity or atrophy  Skin: warm and dry, no rash Neuro:  Strength and sensation are intact Psych: euthymic mood, full affect   EKG:  EKG is ordered today. EKG showed: Sinus rhythm with occasional Premature ventricular complexes When compared with ECG of 21-Oct-2021 11:57, Premature ventricular complexes are now Present    Recent Labs: No results found for requested labs within last 365 days.    Lipid Panel    Component Value Date/Time   CHOL 138 06/16/2016 0410   TRIG 112 06/16/2016 0410   HDL 50 06/16/2016 0410    CHOLHDL 2.8 06/16/2016 0410   VLDL 22 06/16/2016 0410   LDLCALC 66 06/16/2016 0410      Wt Readings from Last 3 Encounters:  10/01/23 105 lb 12.8 oz (48 kg)  05/01/23 105 lb (47.6 kg)  12/11/22 108 lb 8 oz (49.2 kg)           No data to display            ASSESSMENT AND PLAN:  1.  Mitral valve disease with mitral valve prolapse: Status post mitral valve repair in November 2017.  She reports worsening shortness of breath and fatigue.  I requested a follow-up echocardiogram.  2. Frequent PVCs: Well-controlled on Toprol 50 mg twice daily.  3.  Generalized tingling, mild headache and dizziness: There might be an anxiety component.  She has been evaluated by neurology but has not seen them in more than a year.  Will refer her back.   I also asked her to follow-up with her primary care physician as there might be an anxiety component.    Disposition: Follow-up in 6 months.  Signed,  Lorine Bears, MD  10/01/2023 9:42 AM    [facility]

## 2023-10-01 NOTE — Patient Instructions (Addendum)
 Medication Instructions:  No changes *If you need a refill on your cardiac medications before your next appointment, please call your pharmacy*   Lab Work: None ordered If you have labs (blood work) drawn today and your tests are completely normal, you will receive your results only by: MyChart Message (if you have MyChart) OR A paper copy in the mail If you have any lab test that is abnormal or we need to change your treatment, we will call you to review the results.   Testing/Procedures: Your physician has requested that you have an echocardiogram. Echocardiography is a painless test that uses sound waves to create images of your heart. It provides your doctor with information about the size and shape of your heart and how well your heart's chambers and valves are working.   You may receive an ultrasound enhancing agent through an IV if needed to better visualize your heart during the echo. This procedure takes approximately one hour.  There are no restrictions for this procedure.  This will take place at [address] [facility], [place] [address]  Please note: We ask at that you not bring children with you during ultrasound (echo/ vascular) testing. Due to room size and safety concerns, children are not allowed in the ultrasound rooms during exams. Our front office staff cannot provide observation of children in our lobby area while testing is being conducted. An adult accompanying a patient to their appointment will only be allowed in the ultrasound room at the discretion of the ultrasound technician under special circumstances. We apologize for any inconvenience.    Follow-Up: At [facility], you and your health needs are our priority.  As part of our continuing mission to provide you with exceptional heart care, we have created designated Provider Care Teams.  These Care Teams include your primary Cardiologist (physician) and Advanced Practice  Providers (APPs -  Physician Assistants and Nurse Practitioners) who all work together to provide you with the care you need, when you need it.  We recommend signing up for the patient portal called "MyChart".  Sign up information is provided on this After Visit Summary.  MyChart is used to connect with patients for Virtual Visits (Telemedicine).  Patients are able to view lab/test results, encounter notes, upcoming appointments, etc.  Non-urgent messages can be sent to your provider as well.   To learn more about what you can do with MyChart, go to [email or web address].    Your next appointment:   6 month(s)  Provider:   You may see Lorine Bears, MD or one of the following Advanced Practice Providers on your designated Care Team:   Nicolasa Ducking, NP Eula Listen, PA-C Cadence Fransico Michael, PA-C Charlsie Quest, NP Carlos Levering, NP    Other Instructions A referral has been placed to Neurology, Dr. Malvin Johns. You can call [phone] for an appointment

## 2023-10-02 ENCOUNTER — Other Ambulatory Visit: Payer: Self-pay | Admitting: Cardiology

## 2023-10-02 NOTE — Telephone Encounter (Signed)
 This is a Educational psychologist pt

## 2023-10-06 DIAGNOSIS — R202 Paresthesia of skin: Secondary | ICD-10-CM | POA: Diagnosis not present

## 2023-10-06 DIAGNOSIS — R200 Anesthesia of skin: Secondary | ICD-10-CM | POA: Diagnosis not present

## 2023-10-06 DIAGNOSIS — R519 Headache, unspecified: Secondary | ICD-10-CM | POA: Diagnosis not present

## 2023-10-27 ENCOUNTER — Ambulatory Visit: Payer: PPO | Attending: Cardiovascular Disease

## 2023-10-27 DIAGNOSIS — I059 Rheumatic mitral valve disease, unspecified: Secondary | ICD-10-CM

## 2023-10-27 LAB — ECHOCARDIOGRAM COMPLETE
AR max vel: 2.35 cm2
AV Area VTI: 2.14 cm2
AV Area mean vel: 2.18 cm2
AV Mean grad: 1 mmHg
AV Peak grad: 2.5 mmHg
Ao pk vel: 0.8 m/s
Area-P 1/2: 3.27 cm2
Calc EF: 52.8 %
MV VTI: 0.98 cm2
S' Lateral: 2.8 cm
Single Plane A2C EF: 44.9 %
Single Plane A4C EF: 55.7 %

## 2023-10-28 ENCOUNTER — Encounter: Payer: Self-pay | Admitting: *Deleted

## 2023-10-30 ENCOUNTER — Ambulatory Visit: Admission: RE | Admit: 2023-10-30 | Source: Ambulatory Visit

## 2024-01-29 DIAGNOSIS — H43393 Other vitreous opacities, bilateral: Secondary | ICD-10-CM | POA: Diagnosis not present

## 2024-01-29 DIAGNOSIS — H2513 Age-related nuclear cataract, bilateral: Secondary | ICD-10-CM | POA: Diagnosis not present

## 2024-01-29 DIAGNOSIS — H5203 Hypermetropia, bilateral: Secondary | ICD-10-CM | POA: Diagnosis not present

## 2024-01-29 DIAGNOSIS — H43812 Vitreous degeneration, left eye: Secondary | ICD-10-CM | POA: Diagnosis not present

## 2024-02-12 ENCOUNTER — Telehealth: Payer: Self-pay | Admitting: *Deleted

## 2024-02-12 NOTE — Telephone Encounter (Signed)
 Left voicemail for patient to call to reschedule Chest CT .

## 2024-04-01 ENCOUNTER — Telehealth: Payer: Self-pay | Admitting: *Deleted

## 2024-04-01 ENCOUNTER — Encounter: Payer: Self-pay | Admitting: *Deleted

## 2024-04-01 NOTE — Telephone Encounter (Signed)
 Called and left voicemail for patient to call to schedule follow up Chest CT. Also mailed letter to patient to call to schedule.  This would be a follow up CT to recheck an enlarged mediastinal node. However, yearly LDCT will be due 04/30/2024 and can be done to recheck.

## 2024-04-05 NOTE — Progress Notes (Unsigned)
  Electrophysiology Office Follow up Visit Note:    Date:  04/06/2024   ID:  Nicole Norman, DOB 04/10/1946, MRN [id number]  PCP:  Alla Amis, MD  [facility] Cardiologist:  Deatrice Cage, MD  [facility] Electrophysiologist:  OLE ONEIDA HOLTS, MD    Interval History:     Nicole Norman is a 78 y.o. female who presents for a follow up visit.   I last saw the patient October 01, 2022 for PVCs.  She is on beta-blocker for this.  It is thought to be originating near the posterior medial Pap possible.  Today she reports head to toe tingling and chronic fatigue.  No problems with her heart rhythm.  She stopped taking her metoprolol .  She tells me that she was diagnosed with long COVID by Dr. Trudy and was prescribed ivermectin.  The ivermectin was taken for 2 weeks and then stopped.      Past medical, surgical, social and family history were reviewed.  ROS:   Please see the history of present illness.    All other systems reviewed and are negative.  EKGs/Labs/Other Studies Reviewed:    The following studies were reviewed today:  October 27, 2023 echo EF 55-60 RV normal Prosthetic annuloplasty ring at the mitral position        Physical Exam:    VS:  BP 108/64 (BP Location: Left Arm, Patient Position: Sitting, Cuff Size: Normal)   Pulse 87   Ht 4' 11.5 (1.511 m)   Wt 103 lb 4 oz (46.8 kg)   SpO2 98%   BMI 20.50 kg/m     Wt Readings from Last 3 Encounters:  04/06/24 103 lb 4 oz (46.8 kg)  10/01/23 105 lb 12.8 oz (48 kg)  05/01/23 105 lb (47.6 kg)     GEN: no distress CARD: RRR, No MRG RESP: No IWOB. CTAB.      ASSESSMENT:    1. Mitral valve disorder   2. S/P minimally invasive mitral valve repair   3. Frequent PVCs    PLAN:    In order of problems listed above:  #PVCs Thought to be originating from the subvalvular apparatus beneath her mitral ring.  Has responded well to beta-blocker and she has a normal left ventricular  function. Continue metoprolol .  #Post mitral valve repair Hemodynamically stable.  Appears well compensated.  Prosthetic ring functioning appropriately on the most recent echo  #Fatigue Suspicious for poor sleep quality.  Does not appear to be cardiac in origin.  Follow-up with EP on an as-needed basis.   Signed, OLE HOLTS, MD, [facility], [facility] 04/06/2024 9:59 AM    Electrophysiology [facility]

## 2024-04-06 ENCOUNTER — Encounter: Payer: Self-pay | Admitting: Cardiology

## 2024-04-06 ENCOUNTER — Ambulatory Visit: Attending: Cardiology | Admitting: Cardiology

## 2024-04-06 VITALS — BP 108/64 | HR 87 | Ht 59.5 in | Wt 103.2 lb

## 2024-04-06 DIAGNOSIS — Z9889 Other specified postprocedural states: Secondary | ICD-10-CM | POA: Diagnosis not present

## 2024-04-06 DIAGNOSIS — I059 Rheumatic mitral valve disease, unspecified: Secondary | ICD-10-CM

## 2024-04-06 DIAGNOSIS — I493 Ventricular premature depolarization: Secondary | ICD-10-CM | POA: Diagnosis not present

## 2024-04-06 NOTE — Patient Instructions (Signed)
 Medication Instructions:  Your physician recommends that you continue on your current medications as directed. Please refer to the Current Medication list given to you today.  *If you need a refill on your cardiac medications before your next appointment, please call your pharmacy*  Follow-Up: At [facility], you and your health needs are our priority.  As part of our continuing mission to provide you with exceptional heart care, our providers are all part of one team.  This team includes your primary Cardiologist (physician) and Advanced Practice Providers or APPs (Physician Assistants and Nurse Practitioners) who all work together to provide you with the care you need, when you need it.  Your next appointment:   As needed with Dr. Cindie.   You have been referred to [facility]

## 2024-07-19 ENCOUNTER — Ambulatory Visit: Admitting: Cardiovascular Disease

## 2024-07-19 ENCOUNTER — Encounter: Payer: Self-pay | Admitting: Cardiovascular Disease

## 2024-07-19 VITALS — BP 142/72 | HR 102 | Ht 59.5 in | Wt 102.4 lb

## 2024-07-19 DIAGNOSIS — I059 Rheumatic mitral valve disease, unspecified: Secondary | ICD-10-CM

## 2024-07-19 DIAGNOSIS — R42 Dizziness and giddiness: Secondary | ICD-10-CM

## 2024-07-19 DIAGNOSIS — I493 Ventricular premature depolarization: Secondary | ICD-10-CM

## 2024-07-19 MED ORDER — METOPROLOL SUCCINATE ER 25 MG PO TB24: 25.0000 mg | ORAL_TABLET | Freq: Two times a day (BID) | ORAL | 3 refills | Status: AC

## 2024-07-19 NOTE — Patient Instructions (Signed)
 Medication Instructions:  Take Metoprolol  (Toprol ) 25 mg twice daily  *If you need a refill on your cardiac medications before your next appointment, please call your pharmacy*  Lab Work: None ordered If you have labs (blood work) drawn today and your tests are completely normal, you will receive your results only by: MyChart Message (if you have MyChart) OR A paper copy in the mail If you have any lab test that is abnormal or we need to change your treatment, we will call you to review the results.  Testing/Procedures: None ordered  Follow-Up: At [facility], you and your health needs are our priority.  As part of our continuing mission to provide you with exceptional heart care, our providers are all part of one team.  This team includes your primary Cardiologist (physician) and Advanced Practice Providers or APPs (Physician Assistants and Nurse Practitioners) who all work together to provide you with the care you need, when you need it.  Your next appointment:   6 month(s)  Provider:   You may see Deatrice Cage, MD or one of the following Advanced Practice Providers on your designated Care Team:   Lonni Meager, NP Lesley Maffucci, PA-C Bernardino Bring, PA-C Cadence Stewardson, PA-C Tylene Lunch, NP Barnie Hila, NP    We recommend signing up for the patient portal called MyChart.  Sign up information is provided on this After Visit Summary.  MyChart is used to connect with patients for Virtual Visits (Telemedicine).  Patients are able to view lab/test results, encounter notes, upcoming appointments, etc.  Non-urgent messages can be sent to your provider as well.   To learn more about what you can do with MyChart, go to [email or web address].

## 2024-07-19 NOTE — Progress Notes (Unsigned)
 Cardiology Office Note   Date:  07/19/2024   ID:  Nicole Norman, DOB 05/01/1946, MRN [id number]  PCP:  Alla Amis, MD  Cardiologist:   Deatrice Cage, MD   Chief Complaint  Patient presents with   Follow-up    6 month follow up /  pt has c/o SOB with doing anything.  no complaints of chest pain, chest pressure,. medication reviewed verbally with patient .       History of Present Illness: Nicole Norman is a 78 y.o. female who presents for a follow-up visit regarding mitral valve prolapse with regurgitation status post minimally invasive mitral valve repair in November 2017. Cardiac catheterization before mitral valve surgery showed no significant coronary artery disease. She has no history of diabetes, hypertension or hyperlipidemia.  She quit smoking in 2017. She is known to have frequent PVCs with previous 48-hour Holter monitor showing a total of 53,000 beats representing 23% burden with short runs of nonsustained ventricular tachycardia. She was treated by Dr. Fernande with flecainide  but most recently, she was not tolerating the medication very well and thus she was referred to Dr. Cindie.  It was felt that ablation would be difficult given previous mitral valve repair.   Fortunately, her PVCs have been well-controlled with Toprol .  Most recent echocardiogram in March 2025 showed normal LV systolic function with normal functioning mitral valve .   She reports that she was taken off Toprol  by Dr. Trudy due to bradycardia.  She is noted to have recurrent PVCs today. She denies chest pain.  She has stable exertional dyspnea.  She has been under stress due to multiple deaths in her family recently.  Past Medical History:  Diagnosis Date   CHF (congestive heart failure) (HCC)    a. In setting of severe MR; b. 06/2016 TEE: EF 65-60%.   History of bronchitis    History of pneumonia    Incidental pulmonary nodule 06/13/2016   Multiple small ground glass  opacities seen on chest CT scan   Migraine    1x/month - uses Aleve   Normal coronary arteries    a. cardiac cath 06/19/2016: normal coronary arteries and left dominant system, LVEDP nl, 4+ mitral regurgitation, RHC showed mildly elevated LVEDP ( ), giant V waves noted on PCWP w/ mild pulmonary HTN., pulmonary pressure 39/10 with a mean of 27 mmHg, mildly reduced cardiac output at 3.1 with a cardiac index of 2.09, recommned mitral valve repair, acceptable volume status   Paroxysmal SVT (supraventricular tachycardia)    PVC's (premature ventricular contractions)    a. 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 07/02/2016 Complex valvuloplasty including quadrangular resection of posterior leaflet, folding plasty, Gore-tex neochord placement x6 and 32 mm Sorin Memo 3D ring annuloplasty with clipping of LA appendage via right mini thoracotomy approach.   Severe mitral regurgitation    a. TTE 06/05/16: EF 60-65%, nl WM, LV dias fxn nl, mod prolapse of posterior mitral leaflet with mod eccentric regurg, LA mildly dilated, PASP nl; b. TEE 06/18/16: EF 65-70%, LV mildly dilated, no valvular vegetations seen throughout, mitral valve w/ flail motion involving the middle scallop of the post leaflet. Severe regurgitation directed eccentrically, LA mildly dilated   Tobacco abuse    Vertigo    no meds    Past Surgical History:  Procedure Laterality Date   CARDIAC CATHETERIZATION N/A 06/19/2016   Procedure: Right/Left Heart Cath and Coronary Angiography;  Surgeon: Deatrice DELENA Cage, MD;  Location: [facility];  Service: Cardiovascular;  Laterality: N/A;   CESAREAN SECTION     CHOLECYSTECTOMY     CLIPPING OF ATRIAL APPENDAGE N/A 07/02/2016   Procedure: CLIPPING OF ATRIAL APPENDAGE;  Surgeon: Sudie VEAR Laine, MD;  Location: [facility] OR;  Service: Open Heart Surgery;  Laterality: N/A;   COLONOSCOPY N/A 12/07/2014   Procedure: COLONOSCOPY;   Surgeon: Rogelia Copping, MD;  Location: [facility];  Service: Gastroenterology;  Laterality: N/A;   COLONOSCOPY WITH PROPOFOL  N/A 04/29/2022   Procedure: COLONOSCOPY WITH PROPOFOL ;  Surgeon: Maryruth Ole DASEN, MD;  Location: [facility];  Service: Endoscopy;  Laterality: N/A;   ESOPHAGOGASTRODUODENOSCOPY N/A 12/07/2014   Procedure: ESOPHAGOGASTRODUODENOSCOPY (EGD);  Surgeon: Rogelia Copping, MD;  Location: [facility];  Service: Gastroenterology;  Laterality: N/A;   EYE SURGERY Bilateral    lasic   MITRAL VALVE REPAIR Right 07/02/2016   Procedure: MINIMALLY INVASIVE MITRAL VALVE REPAIR (MVR);  Surgeon: Sudie VEAR Laine, MD;  Location: [facility] OR;  Service: Open Heart Surgery;  Laterality: Right;   TEE WITHOUT CARDIOVERSION N/A 06/18/2016   Procedure: TRANSESOPHAGEAL ECHOCARDIOGRAM (TEE);  Surgeon: Lonni Hanson, MD;  Location: [facility] ORS;  Service: Cardiovascular;  Laterality: N/A;   TEE WITHOUT CARDIOVERSION N/A 07/02/2016   Procedure: TRANSESOPHAGEAL ECHOCARDIOGRAM (TEE);  Surgeon: Sudie VEAR Laine, MD;  Location: [facility] OR;  Service: Open Heart Surgery;  Laterality: N/A;   TONSILLECTOMY       Current Outpatient Medications  Medication Sig Dispense Refill   ascorbic acid (VITAMIN C) 500 MG tablet Take 500 mg by mouth daily.     aspirin  EC 81 MG tablet Take 1 tablet (81 mg total) by mouth daily.     calcium -vitamin D  (OSCAL WITH D) 500-200 MG-UNIT tablet Take 1 tablet by mouth.     cholecalciferol  (VITAMIN D ) 1000 units tablet Take 5,000 Units by mouth daily.     Docusate Sodium  (DSS) 100 MG CAPS Take by mouth as needed.     Magnesium  Gluconate 550 MG TABS Take 30 mg by mouth daily.     Multiple Vitamin (MULTIVITAMIN) tablet Take 1 tablet by mouth daily.     vitamin B-12 (CYANOCOBALAMIN ) 1000 MCG tablet Take 1,000 mcg by mouth daily.     Zinc Citrate 16.67 MG CHEW Chew by mouth daily.     metoprolol  succinate (TOPROL -XL) 50 MG 24 hr tablet TAKE 1 TABLET BY MOUTH ONCE DAILY TAKE  WITH   OR  IMMEDIATELY  FOLLOWING  A  MEAL (Patient not taking: Reported on 07/19/2024) 90 tablet 3   No current facility-administered medications for this visit.    Allergies:   Patient has no known allergies.    Social History:  The patient  reports that she has been smoking cigarettes. She started smoking about 53 years ago. She has a 22.5 pack-year smoking history. She has never used smokeless tobacco. She reports current alcohol use. She reports that she does not use drugs.   Family History:  The patient's family history includes Alzheimer's disease in her mother; Breast cancer (age of onset: 58) in her sister; Colon cancer (age of onset: 48) in her sister; Lung cancer in her brother; Stroke in her father.    ROS:  Please see the history of present illness.   Otherwise, review of systems are positive for none.   All other systems are reviewed and negative.    PHYSICAL EXAM: VS:  BP (!) 142/72 (BP Location: Left Arm, Patient Position:  Sitting, Cuff Size: Normal)   Pulse (!) 102   Ht 4' 11.5 (1.511 m)   Wt 102 lb 6.4 oz (46.4 kg)   SpO2 98%   BMI 20.34 kg/m  , BMI Body mass index is 20.34 kg/m. GEN: Well nourished, well developed, in no acute distress  HEENT: normal  Neck: no JVD, carotid bruits, or masses Cardiac: RRR ; no rubs, murmurs or gallops,no edema . Respiratory:  clear to auscultation bilaterally, normal work of breathing GI: soft, nontender, nondistended, + BS MS: no deformity or atrophy  Skin: warm and dry, no rash Neuro:  Strength and sensation are intact Psych: euthymic mood, full affect   EKG:  EKG is ordered today. EKG showed: Sinus tachycardia with frequent Premature ventricular complexes When compared with ECG of 06-Apr-2024 09:46, Premature ventricular complexes are now Present   Recent Labs: No results found for requested labs within last 365 days.    Lipid Panel    Component Value Date/Time   CHOL 138 06/16/2016 0410   TRIG 112 06/16/2016 0410    HDL 50 06/16/2016 0410   CHOLHDL 2.8 06/16/2016 0410   VLDL 22 06/16/2016 0410   LDLCALC 66 06/16/2016 0410      Wt Readings from Last 3 Encounters:  07/19/24 102 lb 6.4 oz (46.4 kg)  04/06/24 103 lb 4 oz (46.8 kg)  10/01/23 105 lb 12.8 oz (48 kg)           No data to display            ASSESSMENT AND PLAN:  1.  Mitral valve disease with mitral valve prolapse: Status post mitral valve repair in November 2017.  Most recent echocardiogram in March showed normal functioning mitral valve and normal ejection fraction.  2. Frequent PVCs: She has recurrent PVCs likely due to stopping metoprolol  altogether.  I elected to resume Toprol  at the lower dose of 25 mg twice daily.    Disposition: Follow-up in 6 months.  Signed,  Deatrice Cage, MD  07/19/2024 3:16 PM    [facility]

## 2024-08-11 ENCOUNTER — Encounter: Payer: Self-pay | Admitting: *Deleted
